# Patient Record
Sex: Female | Born: 1975 | Race: Black or African American | Hispanic: No | Marital: Married | Smoking: Never smoker
Health system: Southern US, Community
[De-identification: ages and names within clinical notes are randomized; demographics above are authoritative.]

## PROBLEM LIST (undated history)

## (undated) ENCOUNTER — Inpatient Hospital Stay (HOSPITAL_COMMUNITY): Payer: Self-pay

## (undated) ENCOUNTER — Inpatient Hospital Stay (HOSPITAL_COMMUNITY): Payer: BC Managed Care – PPO

## (undated) DIAGNOSIS — Z9289 Personal history of other medical treatment: Secondary | ICD-10-CM

## (undated) DIAGNOSIS — K3184 Gastroparesis: Secondary | ICD-10-CM

## (undated) DIAGNOSIS — F32A Depression, unspecified: Secondary | ICD-10-CM

## (undated) DIAGNOSIS — F329 Major depressive disorder, single episode, unspecified: Secondary | ICD-10-CM

## (undated) DIAGNOSIS — N915 Oligomenorrhea, unspecified: Secondary | ICD-10-CM

## (undated) DIAGNOSIS — E039 Hypothyroidism, unspecified: Secondary | ICD-10-CM

## (undated) DIAGNOSIS — K279 Peptic ulcer, site unspecified, unspecified as acute or chronic, without hemorrhage or perforation: Secondary | ICD-10-CM

## (undated) DIAGNOSIS — D649 Anemia, unspecified: Secondary | ICD-10-CM

## (undated) DIAGNOSIS — I959 Hypotension, unspecified: Secondary | ICD-10-CM

## (undated) DIAGNOSIS — B9681 Helicobacter pylori [H. pylori] as the cause of diseases classified elsewhere: Secondary | ICD-10-CM

## (undated) DIAGNOSIS — R519 Headache, unspecified: Secondary | ICD-10-CM

## (undated) DIAGNOSIS — Z8659 Personal history of other mental and behavioral disorders: Secondary | ICD-10-CM

## (undated) DIAGNOSIS — Z87448 Personal history of other diseases of urinary system: Secondary | ICD-10-CM

## (undated) DIAGNOSIS — Z8619 Personal history of other infectious and parasitic diseases: Secondary | ICD-10-CM

## (undated) DIAGNOSIS — B379 Candidiasis, unspecified: Secondary | ICD-10-CM

## (undated) DIAGNOSIS — N938 Other specified abnormal uterine and vaginal bleeding: Secondary | ICD-10-CM

## (undated) DIAGNOSIS — F419 Anxiety disorder, unspecified: Secondary | ICD-10-CM

## (undated) DIAGNOSIS — R102 Pelvic and perineal pain: Secondary | ICD-10-CM

## (undated) DIAGNOSIS — K219 Gastro-esophageal reflux disease without esophagitis: Secondary | ICD-10-CM

## (undated) DIAGNOSIS — Z8742 Personal history of other diseases of the female genital tract: Secondary | ICD-10-CM

## (undated) DIAGNOSIS — E669 Obesity, unspecified: Secondary | ICD-10-CM

## (undated) DIAGNOSIS — R51 Headache: Secondary | ICD-10-CM

## (undated) DIAGNOSIS — Z889 Allergy status to unspecified drugs, medicaments and biological substances status: Secondary | ICD-10-CM

## (undated) DIAGNOSIS — J45909 Unspecified asthma, uncomplicated: Secondary | ICD-10-CM

## (undated) DIAGNOSIS — K760 Fatty (change of) liver, not elsewhere classified: Secondary | ICD-10-CM

## (undated) HISTORY — PX: UPPER GI ENDOSCOPY: SHX6162

## (undated) HISTORY — DX: Fatty (change of) liver, not elsewhere classified: K76.0

## (undated) HISTORY — DX: Personal history of other infectious and parasitic diseases: Z86.19

## (undated) HISTORY — DX: Personal history of other diseases of the female genital tract: Z87.42

## (undated) HISTORY — DX: Helicobacter pylori (H. pylori) as the cause of diseases classified elsewhere: B96.81

## (undated) HISTORY — DX: Gastroparesis: K31.84

## (undated) HISTORY — DX: Personal history of other diseases of urinary system: Z87.448

## (undated) HISTORY — DX: Candidiasis, unspecified: B37.9

## (undated) HISTORY — DX: Major depressive disorder, single episode, unspecified: F32.9

## (undated) HISTORY — DX: Personal history of other mental and behavioral disorders: Z86.59

## (undated) HISTORY — DX: Pelvic and perineal pain: R10.2

## (undated) HISTORY — DX: Obesity, unspecified: E66.9

## (undated) HISTORY — DX: Other specified abnormal uterine and vaginal bleeding: N93.8

## (undated) HISTORY — DX: Peptic ulcer, site unspecified, unspecified as acute or chronic, without hemorrhage or perforation: K27.9

## (undated) HISTORY — DX: Oligomenorrhea, unspecified: N91.5

## (undated) HISTORY — DX: Depression, unspecified: F32.A

---

## 2002-07-17 ENCOUNTER — Ambulatory Visit (HOSPITAL_COMMUNITY): Admission: RE | Admit: 2002-07-17 | Discharge: 2002-07-17 | Payer: Self-pay

## 2002-10-08 ENCOUNTER — Other Ambulatory Visit: Admission: RE | Admit: 2002-10-08 | Discharge: 2002-10-08 | Payer: Self-pay | Admitting: Gynecology

## 2002-10-21 ENCOUNTER — Encounter: Admission: RE | Admit: 2002-10-21 | Discharge: 2003-01-19 | Payer: Self-pay | Admitting: Family Medicine

## 2003-01-17 ENCOUNTER — Encounter (HOSPITAL_COMMUNITY): Admission: RE | Admit: 2003-01-17 | Discharge: 2003-01-19 | Payer: Self-pay | Admitting: Gynecology

## 2003-04-18 ENCOUNTER — Inpatient Hospital Stay (HOSPITAL_COMMUNITY): Admission: AD | Admit: 2003-04-18 | Discharge: 2003-04-18 | Payer: Self-pay | Admitting: Gynecology

## 2005-01-05 ENCOUNTER — Other Ambulatory Visit: Admission: RE | Admit: 2005-01-05 | Discharge: 2005-01-05 | Payer: Self-pay | Admitting: Obstetrics and Gynecology

## 2005-04-27 ENCOUNTER — Other Ambulatory Visit: Admission: RE | Admit: 2005-04-27 | Discharge: 2005-04-27 | Payer: Self-pay | Admitting: Obstetrics and Gynecology

## 2005-09-16 ENCOUNTER — Inpatient Hospital Stay (HOSPITAL_COMMUNITY): Admission: AD | Admit: 2005-09-16 | Discharge: 2005-09-16 | Payer: Self-pay | Admitting: Gynecology

## 2005-09-17 ENCOUNTER — Encounter (HOSPITAL_COMMUNITY): Admission: RE | Admit: 2005-09-17 | Discharge: 2005-10-17 | Payer: Self-pay | Admitting: Gynecology

## 2005-12-22 ENCOUNTER — Emergency Department (HOSPITAL_COMMUNITY): Admission: EM | Admit: 2005-12-22 | Discharge: 2005-12-22 | Payer: Self-pay | Admitting: Emergency Medicine

## 2006-05-01 HISTORY — PX: DILATION AND CURETTAGE OF UTERUS: SHX78

## 2007-01-22 ENCOUNTER — Other Ambulatory Visit: Admission: RE | Admit: 2007-01-22 | Discharge: 2007-01-22 | Payer: Self-pay | Admitting: Gynecology

## 2008-01-30 ENCOUNTER — Other Ambulatory Visit: Admission: RE | Admit: 2008-01-30 | Discharge: 2008-01-30 | Payer: Self-pay | Admitting: Gynecology

## 2009-01-29 ENCOUNTER — Ambulatory Visit (HOSPITAL_COMMUNITY): Admission: RE | Admit: 2009-01-29 | Discharge: 2009-01-29 | Payer: Self-pay | Admitting: Obstetrics

## 2009-01-29 ENCOUNTER — Encounter (INDEPENDENT_AMBULATORY_CARE_PROVIDER_SITE_OTHER): Payer: Self-pay | Admitting: Obstetrics

## 2010-03-07 DIAGNOSIS — Z8742 Personal history of other diseases of the female genital tract: Secondary | ICD-10-CM

## 2010-03-07 HISTORY — DX: Personal history of other diseases of the female genital tract: Z87.42

## 2010-08-04 LAB — CBC
HCT: 33.7 % — ABNORMAL LOW (ref 36.0–46.0)
Hemoglobin: 11.1 g/dL — ABNORMAL LOW (ref 12.0–15.0)
MCHC: 32.8 g/dL (ref 30.0–36.0)
MCV: 80 fL (ref 78.0–100.0)
Platelets: 249 10*3/uL (ref 150–400)
RBC: 4.22 MIL/uL (ref 3.87–5.11)
RDW: 14.5 % (ref 11.5–15.5)
WBC: 9.4 10*3/uL (ref 4.0–10.5)

## 2010-08-04 LAB — TYPE AND SCREEN
ABO/RH(D): O POS
Antibody Screen: NEGATIVE

## 2010-08-04 LAB — ABO/RH: ABO/RH(D): O POS

## 2010-08-16 ENCOUNTER — Other Ambulatory Visit (HOSPITAL_COMMUNITY): Payer: Self-pay | Admitting: Gastroenterology

## 2010-08-16 DIAGNOSIS — R1013 Epigastric pain: Secondary | ICD-10-CM

## 2010-08-16 DIAGNOSIS — R112 Nausea with vomiting, unspecified: Secondary | ICD-10-CM

## 2010-08-30 ENCOUNTER — Encounter (HOSPITAL_COMMUNITY)
Admission: RE | Admit: 2010-08-30 | Discharge: 2010-08-30 | Disposition: A | Discharge status: Home / Self Care | Payer: Managed Care, Other (non HMO) | Source: Ambulatory Visit | Attending: Gastroenterology | Admitting: Gastroenterology

## 2010-08-30 ENCOUNTER — Ambulatory Visit (HOSPITAL_COMMUNITY)
Admission: RE | Admit: 2010-08-30 | Discharge: 2010-08-30 | Disposition: A | Discharge status: Home / Self Care | Payer: Managed Care, Other (non HMO) | Source: Ambulatory Visit | Attending: Gastroenterology | Admitting: Gastroenterology

## 2010-08-30 DIAGNOSIS — R1013 Epigastric pain: Secondary | ICD-10-CM

## 2010-08-30 DIAGNOSIS — R112 Nausea with vomiting, unspecified: Secondary | ICD-10-CM | POA: Insufficient documentation

## 2010-08-30 MED ORDER — TECHNETIUM TC 99M MEBROFENIN IV KIT
5.0000 | PACK | Freq: Once | INTRAVENOUS | Status: AC | PRN
  Administered 2010-08-30: 5 via INTRAVENOUS

## 2010-09-01 ENCOUNTER — Other Ambulatory Visit (HOSPITAL_COMMUNITY): Payer: Self-pay | Admitting: Gastroenterology

## 2010-09-01 DIAGNOSIS — R1013 Epigastric pain: Secondary | ICD-10-CM

## 2010-09-01 DIAGNOSIS — R112 Nausea with vomiting, unspecified: Secondary | ICD-10-CM

## 2010-09-08 ENCOUNTER — Encounter (HOSPITAL_COMMUNITY)
Admission: RE | Admit: 2010-09-08 | Discharge: 2010-09-08 | Disposition: A | Discharge status: Home / Self Care | Payer: Managed Care, Other (non HMO) | Source: Ambulatory Visit | Attending: Gastroenterology | Admitting: Gastroenterology

## 2010-09-08 DIAGNOSIS — R112 Nausea with vomiting, unspecified: Secondary | ICD-10-CM

## 2010-09-08 DIAGNOSIS — R1013 Epigastric pain: Secondary | ICD-10-CM

## 2010-09-08 MED ORDER — TECHNETIUM TC 99M SULFUR COLLOID
2.0000 | Freq: Once | INTRAVENOUS | Status: AC | PRN
  Administered 2010-09-08: 2 via INTRAVENOUS

## 2010-09-12 DIAGNOSIS — N915 Oligomenorrhea, unspecified: Secondary | ICD-10-CM

## 2010-09-12 HISTORY — DX: Oligomenorrhea, unspecified: N91.5

## 2010-09-16 ENCOUNTER — Other Ambulatory Visit (HOSPITAL_COMMUNITY): Payer: Managed Care, Other (non HMO)

## 2011-03-14 DIAGNOSIS — Z87448 Personal history of other diseases of urinary system: Secondary | ICD-10-CM

## 2011-03-14 DIAGNOSIS — R102 Pelvic and perineal pain: Secondary | ICD-10-CM

## 2011-03-14 HISTORY — DX: Personal history of other diseases of urinary system: Z87.448

## 2011-03-14 HISTORY — DX: Pelvic and perineal pain: R10.2

## 2011-07-10 ENCOUNTER — Encounter (INDEPENDENT_AMBULATORY_CARE_PROVIDER_SITE_OTHER): Payer: Managed Care, Other (non HMO) | Admitting: Obstetrics and Gynecology

## 2011-07-10 DIAGNOSIS — O24919 Unspecified diabetes mellitus in pregnancy, unspecified trimester: Secondary | ICD-10-CM

## 2011-07-10 DIAGNOSIS — Z8742 Personal history of other diseases of the female genital tract: Secondary | ICD-10-CM | POA: Insufficient documentation

## 2011-07-10 DIAGNOSIS — O368390 Maternal care for abnormalities of the fetal heart rate or rhythm, unspecified trimester, not applicable or unspecified: Secondary | ICD-10-CM

## 2011-07-10 DIAGNOSIS — R638 Other symptoms and signs concerning food and fluid intake: Secondary | ICD-10-CM | POA: Insufficient documentation

## 2011-07-10 DIAGNOSIS — E119 Type 2 diabetes mellitus without complications: Secondary | ICD-10-CM | POA: Insufficient documentation

## 2011-07-10 LAB — OB RESULTS CONSOLE HEPATITIS B SURFACE ANTIGEN: Hepatitis B Surface Ag: NEGATIVE

## 2011-07-10 LAB — OB RESULTS CONSOLE PLATELET COUNT: Platelets: 277 10*3/uL

## 2011-07-10 LAB — OB RESULTS CONSOLE ABO/RH: RH Type: POSITIVE

## 2011-07-10 LAB — OB RESULTS CONSOLE HIV ANTIBODY (ROUTINE TESTING): HIV: NONREACTIVE

## 2011-07-14 ENCOUNTER — Encounter: Payer: Managed Care, Other (non HMO) | Attending: Obstetrics and Gynecology | Admitting: Dietician

## 2011-07-14 ENCOUNTER — Encounter: Payer: Self-pay | Admitting: Dietician

## 2011-07-14 DIAGNOSIS — E119 Type 2 diabetes mellitus without complications: Secondary | ICD-10-CM | POA: Insufficient documentation

## 2011-07-14 DIAGNOSIS — O24919 Unspecified diabetes mellitus in pregnancy, unspecified trimester: Secondary | ICD-10-CM | POA: Insufficient documentation

## 2011-07-14 DIAGNOSIS — Z713 Dietary counseling and surveillance: Secondary | ICD-10-CM | POA: Insufficient documentation

## 2011-07-14 NOTE — Progress Notes (Signed)
Assessment;Kathryn Howard, is seen today for type 2 diabetes and pregnancy.  We reviewed the physiological changes of pregnancy and how they would effect her blood glucose levels as the pregnancy progresses.  I reviewed monitoring and the blood glucose goals for fasting and at 2 hours after the first bite of each meal.  I used my gestational handout for a review of the diet and the monitoring along with the suggested meal plan.  She has met with the RD at [facility] and has a diet plan that she has started to follow.   Insulin Instruction:  Dr. Pennie Rushing ask that she receive insulin instruction.  She was instructed on mixing insulin and provided a return demonstration of mixing NPH and Regular insulin using sterile saline solution.  She is aware that insulin Kathryn Howard be needed to assist with glucose control later in the pregnancy and she will not start the insulin until it is prescribed.  Patient was seen on 07/14/2011 for Type 2 Diabetes and Pregnancy in a 1:1 appointment along with insulin instruction at the [facility]. The following learning objectives were met by the patient during this session:   Relates the implications of Type 2 diabetes on pregnancy.  States why dietary management is important in controlling blood glucose  Describes the effects each nutrient has on blood glucose levels  Demonstrates ability to create a balanced meal plan  Demonstrates carbohydrate counting   States when to check blood glucose levels  Demonstrates proper blood glucose monitoring techniques  States the effect of stress and exercise on blood glucose levels  States the importance of limiting caffeine and abstaining from alcohol and smoking  Blood glucose monitor given: Has her own One Touch Ultra 2 meter and is monitoring fasting and 2hour post meal blood glucose levels. Blood glucose readings (per her records): February 25 to march 15   Fasting: Feb: 96,89,98,95,  March:89,98,94,90,89,93,87,89,81,85,87,88,80,87 (3/35) 2 hr post BK:157,109,124,127,126 2 hr post LU: 25,366,440,34,742,595,63,875 2 hr post Din: 153,189,115,87,138,116,128,155,88,166   Patient instructed to monitor glucose levels: FBS: 60 - <90 2 hour: <120  Patient received handouts:  Nutrition Diabetes and Pregnancy  Carbohydrate Counting List  Patient will be seen for follow-up as needed.

## 2011-08-02 ENCOUNTER — Encounter (INDEPENDENT_AMBULATORY_CARE_PROVIDER_SITE_OTHER): Payer: Managed Care, Other (non HMO)

## 2011-08-02 ENCOUNTER — Encounter (INDEPENDENT_AMBULATORY_CARE_PROVIDER_SITE_OTHER): Payer: Managed Care, Other (non HMO) | Admitting: Obstetrics and Gynecology

## 2011-08-02 DIAGNOSIS — Z36 Encounter for antenatal screening of mother: Secondary | ICD-10-CM

## 2011-08-02 DIAGNOSIS — O24919 Unspecified diabetes mellitus in pregnancy, unspecified trimester: Secondary | ICD-10-CM

## 2011-08-28 ENCOUNTER — Encounter: Payer: Self-pay | Admitting: Obstetrics and Gynecology

## 2011-08-30 ENCOUNTER — Encounter: Payer: Self-pay | Admitting: Obstetrics and Gynecology

## 2011-08-30 DIAGNOSIS — Z412 Encounter for routine and ritual male circumcision: Secondary | ICD-10-CM

## 2011-08-30 DIAGNOSIS — E119 Type 2 diabetes mellitus without complications: Secondary | ICD-10-CM

## 2011-08-30 DIAGNOSIS — R638 Other symptoms and signs concerning food and fluid intake: Secondary | ICD-10-CM

## 2011-08-30 DIAGNOSIS — Z8742 Personal history of other diseases of the female genital tract: Secondary | ICD-10-CM

## 2011-08-30 DIAGNOSIS — N90810 Female genital mutilation status, unspecified: Secondary | ICD-10-CM | POA: Insufficient documentation

## 2011-08-31 ENCOUNTER — Encounter: Payer: Self-pay | Admitting: Obstetrics and Gynecology

## 2011-08-31 ENCOUNTER — Ambulatory Visit (INDEPENDENT_AMBULATORY_CARE_PROVIDER_SITE_OTHER): Payer: Managed Care, Other (non HMO) | Admitting: Obstetrics and Gynecology

## 2011-08-31 VITALS — BP 102/62 | Wt 235.0 lb

## 2011-08-31 DIAGNOSIS — O2692 Pregnancy related conditions, unspecified, second trimester: Secondary | ICD-10-CM

## 2011-08-31 DIAGNOSIS — R350 Frequency of micturition: Secondary | ICD-10-CM

## 2011-08-31 DIAGNOSIS — O2690 Pregnancy related conditions, unspecified, unspecified trimester: Secondary | ICD-10-CM

## 2011-08-31 DIAGNOSIS — O24919 Unspecified diabetes mellitus in pregnancy, unspecified trimester: Secondary | ICD-10-CM

## 2011-08-31 LAB — POCT URINALYSIS DIPSTICK
Blood, UA: 250
Glucose, UA: NEGATIVE
Spec Grav, UA: 1.025
Urobilinogen, UA: NEGATIVE

## 2011-08-31 NOTE — Progress Notes (Signed)
1) Pt c/o lower abdominal pressure when sitting for long periods of time. Pt c/o urinary frequency but usually only a small amount of urine and discomfort.  POCT UA neg  2)Type III DM  Dr. Lucianne Muss started pt n insulin in addition to glyburide and metformin.  Will discuss with him to see if insulin as a single drug canbe considered for management.     FBS=82-98  2HR PC DINNER=55-162 so Dr. Lucianne Muss added 3 units novolog ac dinner Pt will begin doing 2 hr pc breakfast and lunch as well   3)Anatomy US in 2 wks  4) needs fetal cardiac ECHO at 20-22 weeks  5) AFP done today

## 2011-09-04 LAB — ALPHA FETOPROTEIN, MATERNAL
AFP: 21.2 IU/mL
Curr Gest Age: 16.3 wks.days
MoM for AFP: 1
Open Spina bifida: NEGATIVE
Osb Risk: 1:13400 {titer}

## 2011-09-14 ENCOUNTER — Inpatient Hospital Stay (HOSPITAL_COMMUNITY): Payer: Managed Care, Other (non HMO)

## 2011-09-14 ENCOUNTER — Inpatient Hospital Stay (HOSPITAL_COMMUNITY)
Admission: AD | Admit: 2011-09-14 | Discharge: 2011-10-22 | DRG: 765 | Disposition: A | Discharge status: Home / Self Care | Payer: Managed Care, Other (non HMO) | Source: Ambulatory Visit | Attending: Obstetrics and Gynecology | Admitting: Obstetrics and Gynecology

## 2011-09-14 ENCOUNTER — Encounter (HOSPITAL_COMMUNITY): Payer: Self-pay | Admitting: *Deleted

## 2011-09-14 DIAGNOSIS — O3430 Maternal care for cervical incompetence, unspecified trimester: Principal | ICD-10-CM

## 2011-09-14 DIAGNOSIS — E119 Type 2 diabetes mellitus without complications: Secondary | ICD-10-CM | POA: Diagnosis present

## 2011-09-14 DIAGNOSIS — O99892 Other specified diseases and conditions complicating childbirth: Secondary | ICD-10-CM | POA: Diagnosis present

## 2011-09-14 DIAGNOSIS — Z8742 Personal history of other diseases of the female genital tract: Secondary | ICD-10-CM | POA: Diagnosis present

## 2011-09-14 DIAGNOSIS — O328XX0 Maternal care for other malpresentation of fetus, not applicable or unspecified: Secondary | ICD-10-CM | POA: Diagnosis present

## 2011-09-14 DIAGNOSIS — R000 Tachycardia, unspecified: Secondary | ICD-10-CM | POA: Diagnosis not present

## 2011-09-14 DIAGNOSIS — O24919 Unspecified diabetes mellitus in pregnancy, unspecified trimester: Secondary | ICD-10-CM

## 2011-09-14 DIAGNOSIS — E282 Polycystic ovarian syndrome: Secondary | ICD-10-CM | POA: Diagnosis present

## 2011-09-14 DIAGNOSIS — O9903 Anemia complicating the puerperium: Secondary | ICD-10-CM | POA: Diagnosis present

## 2011-09-14 DIAGNOSIS — W07XXXA Fall from chair, initial encounter: Secondary | ICD-10-CM | POA: Diagnosis not present

## 2011-09-14 DIAGNOSIS — Y9210 Unspecified residential institution as the place of occurrence of the external cause: Secondary | ICD-10-CM | POA: Diagnosis not present

## 2011-09-14 DIAGNOSIS — O8612 Endometritis following delivery: Secondary | ICD-10-CM | POA: Diagnosis present

## 2011-09-14 DIAGNOSIS — N90810 Female genital mutilation status, unspecified: Secondary | ICD-10-CM | POA: Diagnosis present

## 2011-09-14 DIAGNOSIS — R638 Other symptoms and signs concerning food and fluid intake: Secondary | ICD-10-CM | POA: Diagnosis present

## 2011-09-14 DIAGNOSIS — O34599 Maternal care for other abnormalities of gravid uterus, unspecified trimester: Secondary | ICD-10-CM | POA: Diagnosis present

## 2011-09-14 DIAGNOSIS — O99814 Abnormal glucose complicating childbirth: Secondary | ICD-10-CM | POA: Diagnosis present

## 2011-09-14 DIAGNOSIS — D649 Anemia, unspecified: Secondary | ICD-10-CM | POA: Diagnosis present

## 2011-09-14 LAB — WET PREP, GENITAL

## 2011-09-14 LAB — CBC
MCH: 26.3 pg (ref 26.0–34.0)
MCV: 80.1 fL (ref 78.0–100.0)
Platelets: 229 10*3/uL (ref 150–400)
RBC: 4.18 MIL/uL (ref 3.87–5.11)
RDW: 14.2 % (ref 11.5–15.5)
WBC: 12.2 10*3/uL — ABNORMAL HIGH (ref 4.0–10.5)

## 2011-09-14 LAB — URINALYSIS, ROUTINE W REFLEX MICROSCOPIC
Bilirubin Urine: NEGATIVE
Ketones, ur: NEGATIVE mg/dL
Leukocytes, UA: NEGATIVE
Nitrite: NEGATIVE
Protein, ur: NEGATIVE mg/dL
Urobilinogen, UA: 0.2 mg/dL (ref 0.0–1.0)
pH: 6.5 (ref 5.0–8.0)

## 2011-09-14 LAB — URINE MICROSCOPIC-ADD ON

## 2011-09-14 LAB — DIFFERENTIAL
Basophils Absolute: 0 10*3/uL (ref 0.0–0.1)
Eosinophils Absolute: 0 10*3/uL (ref 0.0–0.7)
Eosinophils Relative: 0 % (ref 0–5)
Lymphocytes Relative: 15 % (ref 12–46)
Lymphs Abs: 1.8 10*3/uL (ref 0.7–4.0)
Neutrophils Relative %: 81 % — ABNORMAL HIGH (ref 43–77)

## 2011-09-14 LAB — GLUCOSE, RANDOM: Glucose, Bld: 89 mg/dL (ref 70–99)

## 2011-09-14 LAB — GLUCOSE, CAPILLARY

## 2011-09-14 MED ORDER — GLYBURIDE 2.5 MG PO TABS
2.5000 mg | ORAL_TABLET | Freq: Every day | ORAL | Status: DC
  Filled 2011-09-14: qty 1

## 2011-09-14 MED ORDER — DOCUSATE SODIUM 100 MG PO CAPS
100.0000 mg | ORAL_CAPSULE | Freq: Every day | ORAL | Status: DC
  Administered 2011-09-14 – 2011-09-17 (×4): 100 mg via ORAL
  Filled 2011-09-14 (×4): qty 1

## 2011-09-14 MED ORDER — PENICILLIN G POTASSIUM 5000000 UNITS IJ SOLR
2.5000 10*6.[IU] | INTRAVENOUS | Status: DC
  Administered 2011-09-14 – 2011-09-16 (×12): 2.5 10*6.[IU] via INTRAVENOUS
  Filled 2011-09-14 (×15): qty 2.5

## 2011-09-14 MED ORDER — CALCIUM CARBONATE ANTACID 500 MG PO CHEW
2.0000 | CHEWABLE_TABLET | ORAL | Status: DC | PRN
  Administered 2011-09-18 – 2011-10-17 (×7): 400 mg via ORAL
  Filled 2011-09-14 (×4): qty 2
  Filled 2011-09-14: qty 1
  Filled 2011-09-14 (×2): qty 2
  Filled 2011-09-14: qty 1

## 2011-09-14 MED ORDER — ACETAMINOPHEN 325 MG PO TABS: 650.0000 mg | ORAL_TABLET | ORAL | Status: DC | PRN

## 2011-09-14 MED ORDER — SIMETHICONE 80 MG PO CHEW: 80.0000 mg | CHEWABLE_TABLET | Freq: Four times a day (QID) | ORAL | Status: DC | PRN

## 2011-09-14 MED ORDER — FERROUS SULFATE 325 (65 FE) MG PO TABS
325.0000 mg | ORAL_TABLET | Freq: Every day | ORAL | Status: DC
  Administered 2011-09-14 – 2011-10-17 (×34): 325 mg via ORAL
  Filled 2011-09-14 (×36): qty 1

## 2011-09-14 MED ORDER — IBUPROFEN 600 MG PO TABS: 600.0000 mg | ORAL_TABLET | Freq: Four times a day (QID) | ORAL | Status: DC | PRN

## 2011-09-14 MED ORDER — PENICILLIN G POTASSIUM 5000000 UNITS IJ SOLR
5.0000 10*6.[IU] | Freq: Once | INTRAVENOUS | Status: AC
  Administered 2011-09-14: 5 10*6.[IU] via INTRAVENOUS
  Filled 2011-09-14: qty 5

## 2011-09-14 MED ORDER — LACTATED RINGERS IV SOLN
INTRAVENOUS | Status: DC
  Administered 2011-09-14 – 2011-09-15 (×5): via INTRAVENOUS

## 2011-09-14 MED ORDER — FERROUS SULFATE 325 (65 FE) MG PO TABS: 325.0000 mg | ORAL_TABLET | Freq: Every day | ORAL | Status: DC

## 2011-09-14 MED ORDER — ZOLPIDEM TARTRATE 10 MG PO TABS: 10.0000 mg | ORAL_TABLET | Freq: Every evening | ORAL | Status: DC | PRN

## 2011-09-14 MED ORDER — IRON 66 MG PO TABS: 1.0000 | ORAL_TABLET | Freq: Every day | ORAL | Status: DC

## 2011-09-14 MED ORDER — INOSITOL-FOLIC ACID 2000-200 MG-MCG PO PACK: 2.0000 | PACK | Freq: Two times a day (BID) | ORAL | Status: DC

## 2011-09-14 MED ORDER — PRENATAL MULTIVITAMIN CH
1.0000 | ORAL_TABLET | Freq: Every day | ORAL | Status: DC
  Administered 2011-09-14 – 2011-10-17 (×34): 1 via ORAL
  Filled 2011-09-14 (×34): qty 1

## 2011-09-14 MED ORDER — VITAMIN D3 125 MCG (5000 UT) PO CAPS: 1.0000 | ORAL_CAPSULE | Freq: Every day | ORAL | Status: DC

## 2011-09-14 MED ORDER — VITAMIN D3 25 MCG (1000 UNIT) PO TABS
5000.0000 [IU] | ORAL_TABLET | Freq: Every day | ORAL | Status: DC
  Administered 2011-09-14 – 2011-10-17 (×34): 5000 [IU] via ORAL
  Filled 2011-09-14 (×12): qty 5
  Filled 2011-09-14: qty 1
  Filled 2011-09-14 (×24): qty 5

## 2011-09-14 MED ORDER — METFORMIN HCL ER 500 MG PO TB24
2000.0000 mg | ORAL_TABLET | Freq: Every day | ORAL | Status: DC
  Administered 2011-09-14 – 2011-10-17 (×34): 2000 mg via ORAL
  Filled 2011-09-14 (×34): qty 4

## 2011-09-14 NOTE — MAU Note (Signed)
VOIDED  ON BEDPAN. 

## 2011-09-14 NOTE — MAU Note (Signed)
PT IS NEWLY DX DIABETIC- X2 WEEKS.   PM  BLOOD SUGAR WAS 130.  PT HAS BOOK WITH RECORDED BLOOD SUGARS

## 2011-09-14 NOTE — H&P (Signed)
Kathryn Howard is a 36 y.o. married arabic female presenting at 18.2 weeks for evaluation of bleeding that she noted just before 0030 when having a bowel movement.  Denies any h/o bleeding during this pregnancy.  Nothing per vagina in last 48 hrs.  No abdominal pain currently.  Does report "straining" w/ BM and has been constipated.  Reports more pelvic "pain and pressure" over the last month, especially w/ sitting long periods of time (notes more when at work).  Reports frequency during pregnancy, but unchanged over course of gestation, and no other UTI s/s.  Denies recent illness or fever.   Prenatal Course: Pt started care around 9 weeks and had 2 early viability u/s at both 6 & 9 weeks.  Also had 1st trimester screen w/ u/s, but no cervical length documented.  Aneuploidy screens were WNL.  She has not yet had anatomy scan. Pt has been followed by Dr. Lucianne Muss r/e Type II DM, and had been on Glyburide and Metformin for "4-5 months," but was recently started on insulin at night for elevated cbg's after supper in the last 2 weeks.  She did take progesterone suppositories until 14 weeks secondary to h/o infertility, previous SAB, & PCOS.    OB Hx: G1=SAB with D&E in 2008 G2=current  Maternal Medical History:  Prenatal complications: 1.  Type II DM 2. Obese 3.  PCOS 4.  H/o anxiety & depression--no current meds 5.  H/o infertility 6.  Female circ  Prenatal Complications - Diabetes: type 2. Diabetes is managed by oral agent (dual therapy) and insulin injections.   On insulin, metformin, & glyburide  OB History    Grav Para Term Preterm Abortions TAB SAB Ect Mult Living   2    1  1         Past Medical History  Diagnosis Date  . Vitamin d deficiency   . Obesity   . Gastroparesis   . Diabetes mellitus   . Acid reflux   . Fatty liver   . DUB (dysfunctional uterine bleeding)   . H pylori ulcer   . H/O rubella   . H/O varicella   . Depression     stopped meds 2012  . History of anxiety   .  History of PCOS 03/07/10  . Oligomenorrhea 09/12/10  . Pelvic pain 03/14/11    right sided back  . H/O hematuria 03/14/11  . History of ovarian cyst   . Yeast infection    Past Surgical History  Procedure Date  . Dilation and curettage of uterus 2008   Family History: family history includes Diabetes in her father and mother; Hyperlipidemia in her mother; and Hypertension in her father and mother. Social History:  reports that she has never smoked. She has never used smokeless tobacco. She reports that she does not drink alcohol or use illicit drugs.  Review of Systems  Constitutional: Negative.   HENT: Negative.   Eyes: Negative.   Respiratory: Negative.   Cardiovascular: Negative.   Gastrointestinal: Positive for constipation.  Genitourinary: Negative.   Skin: Negative.   Neurological: Negative.     Dilation: 3.5 Exam by:: HILIARY, CNM   Blood pressure 123/72, pulse 96, temperature 98.1 F (36.7 C), temperature source Oral, resp. rate 20, height 5\' 4"  (1.626 m), weight 238 lb 2 oz (108.013 kg), unknown if currently breastfeeding. Maternal Exam:  Abdomen: Patient reports no abdominal tenderness. Cervix: Cervix evaluated by sterile speculum exam and digital exam.     Fetal Exam Fetal Monitor  Review: Mode: hand-held doppler probe.   Baseline rate: 152.      Physical Exam  Constitutional: She is oriented to person, place, and time. She appears well-developed and well-nourished. No distress.  HENT:  Head: Normocephalic and atraumatic.  Cardiovascular: Normal rate.   Respiratory: Effort normal.  GI: Soft.       gravid  Genitourinary:       SSE:  As spec opened, membranes noted bulging in vault; not close to introitus.  Cx 3-4 cm on bimanual, but difficulty assessing b/c high in vault and cx retracted behind membranes.  BBOW. Small amt of mucousy BRB at introitus, and small amt in vault around membranes  Neurological: She is alert and oriented to person, place, and time.   Skin: Skin is warm and dry.   U/s:  SIUP with AFI subjectively WNL; limited anatomy secondary to habitus and GA, but what seen, WNL.  cx dilated to 1.5 cm, with hourglass of membranes through os.  EFW=9oz (50%).    Prenatal labs: ABO, Rh: O/Positive/-- (03/11 0000) Antibody:  negative Rubella: Immune (03/11 0000) RPR: Nonreactive (03/11 0000)  HBsAg: Negative (03/11 0000)  HIV: Non-reactive (03/11 0000)  GBS:   sent 5/16 1st trimester screen and AFP negative  Assessment/Plan: 1. 18.2 weeks 2.  hourglassing membranes  3.  Type II DM--triple agent therapy  1.  Admit to antenatal w/ Dr. Normand Sloop as attending 2.  Trendelenberg, Foley to SD, PCN IV per protocol, cbg's 4x daily; urine and vaginal cx's sent.  CBC this AM w/ serum glucose.   3.  Per c/w Dr. Normand Sloop, MFM consult this AM for cervical length and discuss if pt is candidate for rescue cerclage 4.  MD to follow    Arrin Pintor H 09/14/2011, 2:08 AM

## 2011-09-14 NOTE — Progress Notes (Signed)
Kathryn Howard is a 36 y.o. G2P0010 at [redacted]w[redacted]d by LMP admitted for preterm cervical dilation  Subjective: GI: negative GU: Denies: dysuria, frequency/urgency, hematuria, genital discharge, bleeding OB: No fetal movement yet        Objective: BP 105/46  Pulse 113  Temp(Src) 98.1 F (36.7 C) (Oral)  Resp 18  Ht 5\' 4"  (1.626 m)  Wt 238 lb 2 oz (108.013 kg)  BMI 40.87 kg/m2  SpO2 100% I/O last 3 completed shifts: In: -  Out: 2850 [Urine:2850] Total I/O In: -  Out: 1900 [Urine:1900]  FHT:  Stable in the 150s UC:   none SVE:   Dilation: 3.5 Exam by:: HILIARY, CNM    Labs: Lab Results  Component Value Date   WBC 12.2* 09/14/2011   HGB 11.0* 09/14/2011   HCT 33.5* 09/14/2011   MCV 80.1 09/14/2011   PLT 229 09/14/2011    Assessment / Plan: Preterm cervical changes Type II diabetes on multiple agents for control  Plan: 1) Long discussion with Dr. Earnie Larsson of MFM concerning his recommendations.  Long discussion with the patient reiterating the options for management of preterm cervical dilation at this early gestation.  His recommendation for amniocentesis is not acceptable to the patient, because of the risk of pregnancy loss.  She does, however wish to proceed with cerclage in spite of the risk of ROM, as she feels her chance of a good outcome is very low without this intervention 2)  Discussed patient's diabetes management with Dr. Lucianne Muss who will see her in am. He agrees that we can discontinue the glyburide and try to maximize sugar control with metformin and insulin   Kathryn Howard P 09/14/2011, 11:58 PM

## 2011-09-14 NOTE — MAU Note (Signed)
WENT TO B-ROOM- SAW  FEW DROPS ON PAPER.

## 2011-09-14 NOTE — Consult Note (Signed)
MATERNAL FETAL MEDICINE CONSULT  Patient Name: Kathryn Howard Medical Record Number:  [id number] Date of Birth: 25-Jun-1975 Requesting Physician Name:  Michael Litter, MD Date of Service: 09/14/2011  Chief Complaint Cervical insufficiency  History of Present Illness Kathryn Howard was seen today secondary to cervical insufficiency at the request of Michael Litter, MD.  The patient is a 36 y.o. G2P0010,at [redacted]w[redacted]d with an EDD of 02/13/2012, by Ultrasound dating method.  She reports progressively worsening symptoms of pelvic pressure and fullness over the past several days.  Last evening she noted vaginal spotting which prompted an evaluation today.  Ultrasound showed dilation of the cervix and membranes protruding up to an possibly past the external cervical os.  She notes no fevers, chills, abdominal pain, further vaginal bleeding, or loss of fluid.  She has not yet felt fetal movement this pregnancy.    Review of Systems Pertinent items are noted in HPI.  Patient History OB History    Grav Para Term Preterm Abortions TAB SAB Ect Mult Living   2    1  1         # Outc Date GA Lbr Len/2nd Wgt Sex Del Anes PTL Lv   1 SAB 2008 [redacted]w[redacted]d   U   No No   Comments: D&C   2 CUR               Past Medical History  Diagnosis Date  . Vitamin d deficiency   . Obesity   . Gastroparesis   . Diabetes mellitus   . Acid reflux   . Fatty liver   . DUB (dysfunctional uterine bleeding)   . H pylori ulcer   . H/O rubella   . H/O varicella   . Depression     stopped meds 2012  . History of anxiety   . History of PCOS 03/07/10  . Oligomenorrhea 09/12/10  . Pelvic pain 03/14/11    right sided back  . H/O hematuria 03/14/11  . History of ovarian cyst   . Yeast infection     Past Surgical History  Procedure Date  . Dilation and curettage of uterus 2008    History   Social History  . Marital Status: Married    Spouse Name: N/A    Number of Children: N/A  . Years of Education: N/A   Social History  Main Topics  . Smoking status: Never Smoker   . Smokeless tobacco: Never Used  . Alcohol Use: No  . Drug Use: No  . Sexually Active: Yes   Other Topics Concern  . None   Social History Narrative  . None    Family History  Problem Relation Age of Onset  . Diabetes Mother   . Hypertension Mother   . Hyperlipidemia Mother   . Diabetes Father   . Hypertension Father    In addition, the patient has no family history of mental retardation, birth defects, or genetic diseases.  There is a significant family history of recurrent pregnancy loss and cervical insufficiency in her family.  The patient's mother and several of her cousins have histories of second trimester losses and have required cerclage placement.  Physical Examination Filed Vitals:   09/14/11 1203  BP: 111/67  Pulse: 95  Temp: 98.4 F (36.9 C)  Resp:    General appearance - alert, well appearing, and in no distress Abdomen - soft, nontender, nondistended, no masses or organomegaly  Assessment and Recommendations 1.  Cervical insufficiency.  The patient's ultrasound  clearly demonstrates dilation of the cervix and without symptoms or other evidence of contractions she appears to have cervical insufficiency.  If nothing is done the patient will almost certainly experience rupture of membranes, develop chorioamnionitis, or go into labor prior to viability.  Thus, the only realistic option that may improve the prognosis for the pregnancy is a rescue cerclage.  There is a elevated risk of subclinical intrauterine infection associated with cervical insufficiency at this gestational age.  To rule this out an amniocentesis may be considered, but there is insufficient evidence to determine if this required in this circumstance. 2.  Type II diabetes mellitus.  Prior to pregnancy the patient's diabetes was managed with metformin.  During pregnancy she also required the addition of glyburide, and was recently started on insulin due to  continued poor glycemic control.  In general oral hypoglycemics are ineffective in achieving adequate glycemic control during pregnancy in women with pre-gestational diabetes.  Insulin is far superior in that regard.  I recommend discontinuing metformin and glyburide and escalating her dose of insulin in order to achieve fasting blood sugars of less than 95 and 2 hour post-prandial of less than 120.   Rema Fendt, MD

## 2011-09-14 NOTE — Progress Notes (Signed)
UR Chart review completed.  

## 2011-09-14 NOTE — Progress Notes (Addendum)
Patient ID: Kathryn Howard, female   DOB: 12/03/1975, 36 y.o.   MRN: [id number]  Hospital day # 0 pregnancy at [redacted]w[redacted]d   S: well, c/o gas pain and mild backache      Contractions:none      Vaginal bleeding:none now       Vaginal discharge: no significant change  O: BP 95/39  Pulse 81  Temp(Src) 98.1 F (36.7 C) (Oral)  Resp 18  Ht 5\' 4"  (1.626 m)  Wt 238 lb 2 oz (108.013 kg)  BMI 40.87 kg/m2  Breastfeeding? Unknown FBS this am =89 Physical exam: General: A&O no distress Lungs: CTAB Heart: RRR Uterus gravid, consistent with 18 weeks and non-tender BSx4 Foley in place Extremities: no significant edema and no signs of DVT, SCD on  A: [redacted]w[redacted]d with hourglassing membranes Type 2 IDDM Mild leukocytosis with slight left shift - on PCN GBS and UA cx pending MFM consult pending Will consult pt's endocrinologist for diabetes mgmnt  P: continue current plan of care MD to follow Will order simethicone PRN Malissa Hippo  MD 09/14/2011 9:04 AM

## 2011-09-14 NOTE — MAU Note (Signed)
PT SAYS  AT 0030 WENT TO B-ROOM TO HAVE BM-  CONSTIPATED.  WHEN WIPED  SAW  RED BLOOD ON PAPER.   IN RM 7- NO BLOOD ON PERINEUM.

## 2011-09-15 ENCOUNTER — Encounter (HOSPITAL_COMMUNITY): Payer: Self-pay | Admitting: Anesthesiology

## 2011-09-15 ENCOUNTER — Encounter (HOSPITAL_COMMUNITY)
Admission: AD | Disposition: A | Discharge status: Home / Self Care | Payer: Self-pay | Source: Ambulatory Visit | Attending: Obstetrics and Gynecology

## 2011-09-15 ENCOUNTER — Inpatient Hospital Stay (HOSPITAL_COMMUNITY): Payer: Managed Care, Other (non HMO) | Admitting: Anesthesiology

## 2011-09-15 HISTORY — PX: CERVICAL CERCLAGE: SHX1329

## 2011-09-15 LAB — URINE CULTURE
Colony Count: NO GROWTH
Culture  Setup Time: 201305161231
Culture: NO GROWTH

## 2011-09-15 LAB — GLUCOSE, CAPILLARY: Glucose-Capillary: 81 mg/dL (ref 70–99)

## 2011-09-15 SURGERY — CERCLAGE, CERVIX, VAGINAL APPROACH
Anesthesia: Spinal | Wound class: Clean Contaminated

## 2011-09-15 MED ORDER — NITROFURANTOIN MONOHYD MACRO 100 MG PO CAPS
100.0000 mg | ORAL_CAPSULE | Freq: Every day | ORAL | Status: DC
  Administered 2011-09-15 – 2011-10-01 (×17): 100 mg via ORAL
  Filled 2011-09-15 (×18): qty 1

## 2011-09-15 MED ORDER — LIDOCAINE IN DEXTROSE 5-7.5 % IV SOLN
INTRAVENOUS | Status: DC | PRN
  Administered 2011-09-15: 1 mL via INTRATHECAL

## 2011-09-15 MED ORDER — PROMETHAZINE HCL 25 MG PO TABS
12.5000 mg | ORAL_TABLET | ORAL | Status: DC | PRN
Start: 2011-09-15 — End: 2011-10-18

## 2011-09-15 MED ORDER — FENTANYL CITRATE 0.05 MG/ML IJ SOLN: 25.0000 ug | INTRAMUSCULAR | Status: DC | PRN

## 2011-09-15 MED ORDER — LACTATED RINGERS IV SOLN
INTRAVENOUS | Status: DC | PRN
  Administered 2011-09-15 (×2): via INTRAVENOUS

## 2011-09-15 MED ORDER — FENTANYL CITRATE 0.05 MG/ML IJ SOLN
INTRAMUSCULAR | Status: AC
Start: 2011-09-15 — End: 2011-09-15
  Filled 2011-09-15: qty 2

## 2011-09-15 MED ORDER — CEFAZOLIN SODIUM-DEXTROSE 2-3 GM-% IV SOLR: 2.0000 g | INTRAVENOUS | Status: DC

## 2011-09-15 MED ORDER — PROMETHAZINE HCL 25 MG/ML IJ SOLN: 12.5000 mg | INTRAMUSCULAR | Status: DC | PRN

## 2011-09-15 MED ORDER — PHENYLEPHRINE 40 MCG/ML (10ML) SYRINGE FOR IV PUSH (FOR BLOOD PRESSURE SUPPORT)
PREFILLED_SYRINGE | INTRAVENOUS | Status: AC
  Filled 2011-09-15: qty 5

## 2011-09-15 MED ORDER — CEFAZOLIN SODIUM-DEXTROSE 2-3 GM-% IV SOLR
2.0000 g | Freq: Once | INTRAVENOUS | Status: AC
  Administered 2011-09-15: 2 g via INTRAVENOUS
  Filled 2011-09-15: qty 50

## 2011-09-15 MED ORDER — FENTANYL CITRATE 0.05 MG/ML IJ SOLN
INTRAMUSCULAR | Status: AC
  Filled 2011-09-15: qty 2

## 2011-09-15 MED ORDER — OXYCODONE-ACETAMINOPHEN 5-325 MG PO TABS
1.0000 | ORAL_TABLET | ORAL | Status: DC | PRN
  Administered 2011-09-15: 1 via ORAL
  Filled 2011-09-15: qty 1

## 2011-09-15 MED ORDER — LIDOCAINE IN DEXTROSE 5-7.5 % IV SOLN
INTRAVENOUS | Status: AC
  Filled 2011-09-15: qty 2

## 2011-09-15 MED ORDER — PHENYLEPHRINE HCL 10 MG/ML IJ SOLN
INTRAMUSCULAR | Status: DC | PRN
  Administered 2011-09-15 (×4): 80 ug via INTRAVENOUS
  Administered 2011-09-15: 40 ug via INTRAVENOUS

## 2011-09-15 MED ORDER — CITRIC ACID-SODIUM CITRATE 334-500 MG/5ML PO SOLN
ORAL | Status: AC
  Filled 2011-09-15: qty 15

## 2011-09-15 MED ORDER — BUPIVACAINE HCL (PF) 0.5 % IJ SOLN
INTRAMUSCULAR | Status: AC
  Filled 2011-09-15: qty 30

## 2011-09-15 MED ORDER — ONDANSETRON HCL 4 MG/2ML IJ SOLN
INTRAMUSCULAR | Status: DC | PRN
  Administered 2011-09-15: 4 mg via INTRAVENOUS

## 2011-09-15 MED ORDER — CITRIC ACID-SODIUM CITRATE 334-500 MG/5ML PO SOLN
ORAL | Status: DC | PRN
  Administered 2011-09-15: 30 mL via ORAL

## 2011-09-15 MED ORDER — FENTANYL CITRATE 0.05 MG/ML IJ SOLN
INTRAMUSCULAR | Status: DC | PRN
  Administered 2011-09-15 (×3): 50 ug via INTRAVENOUS

## 2011-09-15 SURGICAL SUPPLY — 19 items
CATH ROBINSON RED A/P 16FR (CATHETERS) IMPLANT
CLOTH BEACON ORANGE TIMEOUT ST (SAFETY) ×2 IMPLANT
COUNTER NEEDLE 1200 MAGNETIC (NEEDLE) IMPLANT
GLOVE BIOGEL PI IND STRL 8.5 (GLOVE) ×1 IMPLANT
GLOVE BIOGEL PI INDICATOR 8.5 (GLOVE) ×1
GLOVE ECLIPSE 8.0 STRL XLNG CF (GLOVE) ×4 IMPLANT
GOWN PREVENTION PLUS LG XLONG (DISPOSABLE) ×2 IMPLANT
NEEDLE MA TROC 1/2 (NEEDLE) IMPLANT
NEEDLE MAYO .5 CIRCLE (NEEDLE) ×2 IMPLANT
NEEDLE SPNL 22GX3.5 QUINCKE BK (NEEDLE) IMPLANT
PACK VAGINAL MINOR WOMEN LF (CUSTOM PROCEDURE TRAY) ×2 IMPLANT
PAD PREP 24X48 CUFFED NSTRL (MISCELLANEOUS) ×2 IMPLANT
SUT MERSILENE 5MM BP 1 12 (SUTURE) ×2 IMPLANT
SYR CONTROL 10ML LL (SYRINGE) IMPLANT
TOWEL OR 17X24 6PK STRL BLUE (TOWEL DISPOSABLE) ×4 IMPLANT
TRAY FOLEY CATH 14FR (SET/KITS/TRAYS/PACK) IMPLANT
TUBING NON-CON 1/4 X 20 CONN (TUBING) IMPLANT
WATER STERILE IRR 1000ML POUR (IV SOLUTION) ×2 IMPLANT
YANKAUER SUCT BULB TIP NO VENT (SUCTIONS) IMPLANT

## 2011-09-15 NOTE — Progress Notes (Signed)
Subjective:  The patient reports that she is doing okay.  Objective:  Vital signs are stable Chest is clear Heart regular rate and rhythm Stable fetal heart tones  Assessment:  18/[redacted] week gestation with preterm cervical change. 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 We talked about the concern that she will rupture her membranes and/or deliver prior to viability if we do not attempt a cerclage. We also discussed the possibility that if we perform a cerclage, that we may rupture the membranes. We also discussed the possibility that she will develop chorioamnionitis as a result of the cerclage. I acknowledged that her decision is a difficult one. The risk and benefits of surgery were reviewed. The patient reports that she understands and accepts the above risks. She wants to proceed with rescue cerclage today.  Kathryn Howard.D.

## 2011-09-15 NOTE — Op Note (Signed)
OPERATIVE NOTE  Davanna Horkey  DOB:    1975/05/26  MRN:    [id number]  CSN:    [id number]  Date of Surgery:  09/15/2011  Preoperative Diagnosis:  18-1/[redacted] week gestation Cervical dilatation Questionable incompetent cervix Obesity  Postoperative Diagnosis:  18-1/[redacted] week gestation Cervical dilatation Questionable incompetent cervix Obesity  Procedure:  Rescue McDonald cerclage  Surgeon:  Leonard Schwartz, M.D.  Assistant:  None  Anesthetic:  Spinal  Disposition:  The patient is a 36 year old female who presented at 18-1/[redacted] weeks gestation with silent dilatation of her cervix. She understands the indications for her surgical procedure as well as her alternative treatment options. She understands that the chance of care in this pregnancy the viability without a cerclage are very low. She understands that, even with a cerclage, there is a chance that she will ruptured her membranes and still not be able to carry the pregnancy to viability. She understands that there is a significant risk that the membranes will rupture during the procedure. She understands and accepts the above risks. She also accepts the risk of, but not limited to, anesthetic complications, bleeding, infections, and possible damage to the surrounding organs.  Findings:  The uterus is approximately 20 week size. The cervix was 5 cm dilated and 75% effaced. The membranes were easily visible at the cervix. At the end of our procedure the cervix was closed.  Procedure:  The patient was taken to the operating room where a spinal anesthetic was given. The patient's lower abdomen, perineum, and vagina were prepped with multiple layers of Betadine. A catheter had previously been placed in the bladder. A speculum was placed in the vagina. The cervix was 5 cm dilated. The vagina was prepped with Betadine once again. Forceps were placed on the cervix. A 30 cc Foley catheter was placed inside the cervix and the Foley  bulb was inflated. The patient was placed in Trendelenburg position. The membranes receded into the uterine cavity. A Mersilene stitch was placed around the cervix. Care was taken not to rupture the membranes and not to rupture the Foley bulb. The stitch was tied in the posterior vagina. The Foley bulb was deflated and the catheter was removed from the uterine cavity. The patient was taken out of Trendelenburg. The membranes were then visualized at the cervical os. The cervix was approximately 1-2 cm dilated. I was concerned that the membranes would 2 through the cervix over time. The patient was placed back in Trendelenburg position. The Foley catheter was placed back in the cervix and the Foley bulb was inflated. A second Mersilene cerclage was placed circumferentially around the cervix. This time the Foley bulb was deflated and the catheter was removed. The knot was tied in the posterior vagina. The patient was taken out of Trendelenburg position. The cervix was thought to be closed. No membranes were visible. The first cerclage was removed. Again the cervix remained closed. Hemostasis was adequate. There was no evidence of leakage of fluid. Needle and sponge counts were correct. The patient was returned to the supine position. She was transported to the recovery room in stable condition. She tolerated her procedure well. The estimated blood loss was 20 cc.  Leonard Schwartz, M.D.

## 2011-09-15 NOTE — Progress Notes (Signed)
Kathryn Howard is a 36 y.o. G2P0010 at [redacted]w[redacted]d by  admitted for hour glassing membranes Subjective: Comfortable, denies cramping, srom, vag bleeding, with +FM  Objective: BP 105/48  Pulse 74  Temp(Src) 98.3 F (36.8 C) (Oral)  Resp 18  Ht 5\' 4"  (1.626 m)  Wt 238 lb 2 oz (108.013 kg)  BMI 40.87 kg/m2  SpO2 100% I/O last 3 completed shifts: In: -  Out: 6100 [Urine:6100]   Results for orders placed during the hospital encounter of 09/14/11 (from the past 48 hour(s))  URINALYSIS, ROUTINE W REFLEX MICROSCOPIC     Status: Abnormal   Collection Time   09/14/11  1:21 AM      Component Value Range Comment   Color, Urine YELLOW  YELLOW     APPearance CLEAR  CLEAR     Specific Gravity, Urine <1.005 (*) 1.005 - 1.030     pH 6.5  5.0 - 8.0     Glucose, UA NEGATIVE  NEGATIVE (mg/dL)    Hgb urine dipstick LARGE (*) NEGATIVE     Bilirubin Urine NEGATIVE  NEGATIVE     Ketones, ur NEGATIVE  NEGATIVE (mg/dL)    Protein, ur NEGATIVE  NEGATIVE (mg/dL)    Urobilinogen, UA 0.2  0.0 - 1.0 (mg/dL)    Nitrite NEGATIVE  NEGATIVE     Leukocytes, UA NEGATIVE  NEGATIVE    URINE CULTURE     Status: Normal   Collection Time   09/14/11  1:21 AM      Component Value Range Comment   Specimen Description URINE, CLEAN CATCH      Special Requests NONE      Culture  Setup Time [id number]      Colony Count NO GROWTH      Culture NO GROWTH      Report Status 09/15/2011 FINAL     URINE MICROSCOPIC-ADD ON     Status: Abnormal   Collection Time   09/14/11  1:21 AM      Component Value Range Comment   Squamous Epithelial / LPF FEW (*) RARE     WBC, UA 0-2  <3 (WBC/hpf)    RBC / HPF 3-6  <3 (RBC/hpf)    Bacteria, UA FEW (*) RARE    GC/CHLAMYDIA PROBE AMP, GENITAL     Status: Normal   Collection Time   09/14/11  1:45 AM      Component Value Range Comment   GC Probe Amp, Genital NEGATIVE  NEGATIVE     Chlamydia, DNA Probe NEGATIVE  NEGATIVE    WET PREP, GENITAL     Status: Abnormal   Collection Time   09/14/11   1:45 AM      Component Value Range Comment   Yeast Wet Prep HPF POC NONE SEEN  NONE SEEN     Trich, Wet Prep NONE SEEN  NONE SEEN     Clue Cells Wet Prep HPF POC NONE SEEN  NONE SEEN     WBC, Wet Prep HPF POC FEW (*) NONE SEEN  FEW BACTERIA SEEN  CULTURE, BETA STREP (GROUP B ONLY)     Status: Normal (Preliminary result)   Collection Time   09/14/11  4:10 AM      Component Value Range Comment   Specimen Description VAGINAL/RECTAL      Special Requests NONE      Culture Culture reincubated for better growth      Report Status PENDING     CBC     Status: Abnormal  Collection Time   09/14/11  4:25 AM      Component Value Range Comment   WBC 12.2 (*) 4.0 - 10.5 (K/uL)    RBC 4.18  3.87 - 5.11 (MIL/uL)    Hemoglobin 11.0 (*) 12.0 - 15.0 (g/dL)    HCT 16.1 (*) 09.6 - 46.0 (%)    MCV 80.1  78.0 - 100.0 (fL)    MCH 26.3  26.0 - 34.0 (pg)    MCHC 32.8  30.0 - 36.0 (g/dL)    RDW 04.5  40.9 - 81.1 (%)    Platelets 229  150 - 400 (K/uL)   DIFFERENTIAL     Status: Abnormal   Collection Time   09/14/11  4:25 AM      Component Value Range Comment   Neutrophils Relative 81 (*) 43 - 77 (%)    Neutro Abs 9.8 (*) 1.7 - 7.7 (K/uL)    Lymphocytes Relative 15  12 - 46 (%)    Lymphs Abs 1.8  0.7 - 4.0 (K/uL)    Monocytes Relative 4  3 - 12 (%)    Monocytes Absolute 0.5  0.1 - 1.0 (K/uL)    Eosinophils Relative 0  0 - 5 (%)    Eosinophils Absolute 0.0  0.0 - 0.7 (K/uL)    Basophils Relative 0  0 - 1 (%)    Basophils Absolute 0.0  0.0 - 0.1 (K/uL)   GLUCOSE, RANDOM     Status: Normal   Collection Time   09/14/11  4:25 AM      Component Value Range Comment   Glucose, Bld 89  70 - 99 (mg/dL)   GLUCOSE, CAPILLARY     Status: Abnormal   Collection Time   09/14/11 10:45 AM      Component Value Range Comment   Glucose-Capillary 105 (*) 70 - 99 (mg/dL)   GLUCOSE, CAPILLARY     Status: Abnormal   Collection Time   09/14/11  4:17 PM      Component Value Range Comment   Glucose-Capillary 119 (*) 70 -  99 (mg/dL)    Comment 1 Notify RN      Comment 2 Documented in Chart     GLUCOSE, CAPILLARY     Status: Normal   Collection Time   09/14/11 10:29 PM      Component Value Range Comment   Glucose-Capillary 94  70 - 99 (mg/dL)   GLUCOSE, CAPILLARY     Status: Normal   Collection Time   09/15/11  6:40 AM      Component Value Range Comment   Glucose-Capillary 87  70 - 99 (mg/dL)   GLUCOSE, CAPILLARY     Status: Normal   Collection Time   09/15/11 11:56 AM      Component Value Range Comment   Glucose-Capillary 90  70 - 99 (mg/dL)    Comment 1 Notify RN      Comment 2 Documented in Chart      Physical Exam:  Gen: alert, cooperative, no distress Chest/Lungs: cta bilaterally  Heart/Pulse: RRR  Abdomen: soft, gravid, nontender, BX x4 quad Uterine fundus: soft, nontender Skin & Color: warm and dry  Neurological: AOx3, DTRs not assessed EXT: negative Homan's b/l, edema none  FHT:  present UC:   none SVE:   Dilation: 3.5 Exam by:: HILIARY, CNM    Labs: Lab Results  Component Value Date   WBC 12.2* 09/14/2011   HGB 11.0* 09/14/2011   HCT 33.5* 09/14/2011   MCV  80.1 09/14/2011   PLT 229 09/14/2011   CBG (last 3)   Basename 09/15/11 1156 09/15/11 0640 09/14/11 2229  GLUCAP 90 87 94     Assessment and Plan: has Type 2 diabetes mellitus; Increased BMI; Hx of infertility; Female circumcision; History of PCOS; H/O miscarriage, currently pregnant; and Incompetent cervix in pregnancy, antepartum on her problem list. 18 3/7 week IUP Has questions regarding cerclage, will discuss with Dr. Stefano Gaul. Continue care.  Kathryn Howard 09/15/2011, 12:09 PM

## 2011-09-15 NOTE — Anesthesia Procedure Notes (Signed)
Spinal  Patient location during procedure: OR Start time: 09/15/2011 5:35 PM Staffing Performed by: anesthesiologist  Preanesthetic Checklist Completed: patient identified, site marked, surgical consent, pre-op evaluation, timeout performed, IV checked, risks and benefits discussed and monitors and equipment checked Spinal Block Patient position: sitting Prep: site prepped and draped and DuraPrep Patient monitoring: heart rate, cardiac monitor, continuous pulse ox and blood pressure Approach: midline Location: L3-4 Injection technique: single-shot Needle Needle type: Sprotte  Needle gauge: 24 G Needle length: 9 cm Assessment Sensory level: T4 Additional Notes Clear free flow CSF on first attempt.  No paresthesia.  Patient tolerated procedure well. Jasmine December, MD

## 2011-09-15 NOTE — Progress Notes (Signed)
Subjective: Reason for consultation: Diabetes management  This is a 36 year old lady who has had diabetes since about 2008 and has been managed during her pregnancy with metformin and glyburide. She had been taking metformin prior to her pregnancy and glyburide was added. She was last seen in the office in April and her A1c was 5.7 %. At that time she was having somewhat higher readings after supper without median off 134. However she was not having any high readings in the mornings with a median off 85 before breakfast she generally eats only one meal a day in the evening. Because of her higher) and readings she was started on NovoLog insulin 3 units before supper. She has been monitoring her blood sugar fairly regularly after supper and has had only one reading over 130 but most of them are reading below 120. She has done well but taking insulin regularly before supper and is generally watching her diet as instructed. Since her last visit also she has started eating small meals at breakfast and lunch.  Prior to her admission she has kept a blood sugar record and this shows fairly good readings throughout the day with morning readings consistently below 90 except for once. Her postprandial readings as above also fairly good and she has not needed to increase her insulin beyond the 3 units. Since her hospitalization also her blood sugars have been consistently below 100 Nonfasting. No clear if readings are being checked 2 hours after meals. Currently the patient is at bedrest and also will be hospitalized for quite sometime. Currently she is only [redacted] weeks pregnant  Past Medical History  Diagnosis Date  . Vitamin d deficiency   . Obesity   . Gastroparesis   . Diabetes mellitus   . Acid reflux   . Fatty liver   . DUB (dysfunctional uterine bleeding)   . H pylori ulcer   . H/O rubella   . H/O varicella   . Depression     stopped meds 2012  . History of anxiety   . History of PCOS 03/07/10  .  Oligomenorrhea 09/12/10  . Pelvic pain 03/14/11    right sided back  . H/O hematuria 03/14/11  . History of ovarian cyst   . Yeast infection    History   Social History  . Marital Status: Married    Spouse Name: N/A    Number of Children: N/A  . Years of Education: N/A   Occupational History  . Not on file.   Social History Main Topics  . Smoking status: Never Smoker   . Smokeless tobacco: Never Used  . Alcohol Use: No  . Drug Use: No  . Sexually Active: Yes   Other Topics Concern  . Not on file   Social History Narrative  . No narrative on file    Objective: Vital signs in last 24 hours: Temp:  [98.1 F (36.7 C)-98.4 F (36.9 C)] 98.3 F (36.8 C) (05/17 1154) Pulse Rate:  [74-113] 74  (05/17 1154) Resp:  [18] 18  (05/17 1154) BP: (105-117)/(46-62) 105/48 mmHg (05/17 1154) SpO2:  [99 %-100 %] 100 % (05/16 2135) Weight change:     Intake/Output from previous day: 05/16 0701 - 05/17 0700 In: -  Out: 6100 [Urine:6100] Intake/Output this shift:      Lab Results:  Basename 09/14/11 0425  WBC 12.2*  HGB 11.0*  HCT 33.5*  PLT 229   BMET  Basename 09/14/11 0425  NA --  K --  CL --  CO2 --  GLUCOSE 89  BUN --  CREATININE --  CALCIUM --    Studies/Results: US Ob Detail + 14 Wk  09/14/2011  OBSTETRICAL ULTRASOUND: This exam was performed within a [facility]. The OB US report was generated in the AS system, and faxed to the ordering physician.   This report is also available in [employer] and in the [employer]. See AS Obstetric US report.   US Ob Follow Up  09/14/2011  OBSTETRICAL ULTRASOUND: This exam was performed within a [facility]. The OB US report was generated in the AS system, and faxed to the ordering physician.   This report is also available in [employer] and in the [employer]. See AS Obstetric US report.    Medications: I have reviewed the  patient's current medications.  Assessment/Plan: The patient has excellent blood sugar control even with not resuming her mealtime insulin and glyburide yesterday. Currently is on metformin ER which she is tolerating well but it this has been reported safe in pregnancy and will continue. She may need more intensive insulin therapy if down the road but since her blood sugars are looking excellent would not Change her regimen. I will followup her blood glucose profile in 2 days to see if her basal insulin as needed with stopping her glyburide.   LOS: 1 day   Saachi Zale 09/15/2011, 1:39 PM

## 2011-09-15 NOTE — Progress Notes (Signed)
Visited with pt at nurse's referral.  Kathryn Howard has had a history of loss of several miscarriages and after 10 years of marriage, this pregnancy came as a welcome gift that they were not expecting.  They are very nervous about this pregnancy because they have had so much difficulty with pregnancy in the past and she and her husband are each coping with their anxiety in different ways.  I offered compassionate listening, prayer and affirmation.    Please page as needs arise or as pt requests. [phone]  Chaplain Dyanne Carrel 3:51 PM  09/15/11 1500  Clinical Encounter Type  Visited With Patient  Visit Type Initial;Spiritual support  Referral From Nurse  Spiritual Encounters  Spiritual Needs Emotional;Prayer  Stress Factors  Patient Stress Factors Family relationships (History of loss)

## 2011-09-15 NOTE — Anesthesia Postprocedure Evaluation (Signed)
  Anesthesia Post-op Note  Patient: Kathryn Howard  Procedure(s) Performed: Procedure(s) (LRB): CERCLAGE CERVICAL (N/A)  Patient Location: PACU  Anesthesia Type: Spinal  Level of Consciousness: awake, alert  and oriented  Airway and Oxygen Therapy: Patient Spontanous Breathing  Post-op Pain: none  Post-op Assessment: Post-op Vital signs reviewed, Patient's Cardiovascular Status Stable, Respiratory Function Stable, Patent Airway, No signs of Nausea or vomiting, Adequate PO intake, Pain level controlled, No headache, No backache, No residual numbness and No residual motor weakness  Post-op Vital Signs: Reviewed and stable  Complications: No apparent anesthesia complications

## 2011-09-15 NOTE — Progress Notes (Signed)
Called [place] CNM to clarify diet order. Ok for pt to eat at this time as tolerated carb modified diet.  Also orders to hold Metformin tonight and resume tomorrow due to NPO status today.

## 2011-09-15 NOTE — Progress Notes (Signed)
Admitted at  [redacted] weeks gestation, with incompetent cervix.  Height  64 Weight 238 Lbs pre-pregnancy weight 243 Lbs.Pre-pregnancy  BMI 41.7  IBW 120 Lbs  Total weight gain none, 5 Lb weight loss. Weight gain goals 11-20 Lbs.   Estimated needs: 2000-2200 kcal/day, 75-85 grams protein/day, 2.3 liters fluid/day Carbohydrate modified gestational diet tolerated well, appetite good. No recent N/V. In trendelenburg. Currently NPO prior to surgery CHO modified  diet prescription will provide for increased needs. Patient was provide with snack menu. No abnormal nutrition related labs.  CBG (last 3)   Basename 09/15/11 1156 09/15/11 0640 09/14/11 2229  GLUCAP 90 87 94     Nutrition Dx: Increased nutrient needs r/t pregnancy and fetal growth requirements aeb [redacted] weeks gestation.  No educational needs assessed at this time. Pt has been followed by Nutritionist at [facility] ( March 2013)

## 2011-09-15 NOTE — Anesthesia Preprocedure Evaluation (Signed)
Anesthesia Evaluation  Patient identified by MRN, date of birth, ID band Patient awake    Reviewed: Allergy & Precautions, H&P , NPO status , Patient's Chart, lab work & pertinent test results, reviewed documented beta blocker date and time   History of Anesthesia Complications Negative for: history of anesthetic complications  Airway Mallampati: III TM Distance: >3 FB Neck ROM: full    Dental  (+) Teeth Intact   Pulmonary neg pulmonary ROS,  breath sounds clear to auscultation        Cardiovascular negative cardio ROS  Rhythm:regular Rate:Normal     Neuro/Psych PSYCHIATRIC DISORDERS (h/o anxiety/depression) negative neurological ROS     GI/Hepatic Neg liver ROS, PUD, GERD- (with pregnancy)  ,  Endo/Other  Diabetes mellitus- (since age 35, on insulin with pregnancy only), Type 2, Oral Hypoglycemic Agents and Insulin DependentMorbid obesity  Renal/GU negative Renal ROS  negative genitourinary   Musculoskeletal   Abdominal   Peds  Hematology negative hematology ROS (+)   Anesthesia Other Findings NPO since 9:30 am - ate eggs and toast  Reproductive/Obstetrics (+) Pregnancy (18 week, incompetent cervix)                           Anesthesia Physical Anesthesia Plan  ASA: III  Anesthesia Plan: Spinal   Post-op Pain Management:    Induction:   Airway Management Planned:   Additional Equipment:   Intra-op Plan:   Post-operative Plan:   Informed Consent: I have reviewed the patients History and Physical, chart, labs and discussed the procedure including the risks, benefits and alternatives for the proposed anesthesia with the patient or authorized representative who has indicated his/her understanding and acceptance.     Plan Discussed with: Surgeon and CRNA  Anesthesia Plan Comments:         Anesthesia Quick Evaluation

## 2011-09-15 NOTE — Plan of Care (Signed)
PACU called ad gave report on pt. Pt will be back on unit around 2030.

## 2011-09-16 LAB — GLUCOSE, CAPILLARY
Glucose-Capillary: 276 mg/dL — ABNORMAL HIGH (ref 70–99)
Glucose-Capillary: 89 mg/dL (ref 70–99)
Glucose-Capillary: 103 mg/dL — ABNORMAL HIGH (ref 70–99)
Glucose-Capillary: 94 mg/dL (ref 70–99)

## 2011-09-16 LAB — CULTURE, BETA STREP (GROUP B ONLY)

## 2011-09-16 MED ORDER — HYDROXYPROGESTERONE CAPROATE 250 MG/ML IM OIL
250.0000 mg | TOPICAL_OIL | Freq: Once | INTRAMUSCULAR | Status: DC
  Filled 2011-09-16: qty 1

## 2011-09-16 MED ORDER — HYDROXYPROGESTERONE CAPROATE 250 MG/ML IM OIL
250.0000 mg | TOPICAL_OIL | INTRAMUSCULAR | Status: DC
  Administered 2011-09-16 – 2011-10-14 (×5): 250 mg via INTRAMUSCULAR
  Filled 2011-09-16 (×5): qty 1

## 2011-09-16 MED ORDER — AMOXICILLIN 500 MG PO CAPS
500.0000 mg | ORAL_CAPSULE | Freq: Two times a day (BID) | ORAL | Status: DC
  Administered 2011-09-16 – 2011-09-17 (×3): 500 mg via ORAL
  Filled 2011-09-16 (×3): qty 1

## 2011-09-16 MED ORDER — POLYETHYLENE GLYCOL 3350 17 G PO PACK
17.0000 g | PACK | Freq: Every day | ORAL | Status: DC
  Administered 2011-09-16 – 2011-10-17 (×32): 17 g via ORAL
  Filled 2011-09-16 (×35): qty 1

## 2011-09-16 MED ORDER — INSULIN ASPART 100 UNIT/ML ~~LOC~~ SOLN
3.0000 [IU] | Freq: Every day | SUBCUTANEOUS | Status: DC
  Administered 2011-09-16 – 2011-09-20 (×5): 3 [IU] via SUBCUTANEOUS

## 2011-09-16 NOTE — Progress Notes (Addendum)
36 y.o. year old female,at [redacted]w[redacted]d gestation. Postoperative day 1 from rescue cerclage  SUBJECTIVE:  The patient reports that she is doing well. She does complain of constipation.  OBJECTIVE:  BP 107/58  Pulse 86  Temp(Src) 98.4 F (36.9 C) (Oral)  Resp 20  Ht 5\' 4"  (1.626 m)  Wt 108.013 kg (238 lb 2 oz)  BMI 40.87 kg/m2  SpO2 100%  Fetal Heart Tones:  150 beats per minute  Contractions:          None  Chest: Clear Heart: Regular rate and rhythm Abdomen: Nontender Extremities: Within normal limits Bleeding: Minimal  Fasting blood sugar: 89  GC: Negative Chlamydia: Negative Urine culture: Negative Beta strep: Recultured   ASSESSMENT:  [redacted]w[redacted]d Weeks Pregnancy  Status post rescue cerclage day 1  Diabetes  Constipation  PLAN:  Begin 17P. 250 mg IM each week call to [facility] [phone]). Her husband will pick up the medication and bring the [facility] for injection. Prune juice and MiraLAX for constipation Begin  amoxicillin pills Continue bedrest in-hospital  Leonard Schwartz, M.D.

## 2011-09-16 NOTE — Progress Notes (Signed)
1235- of clear yellow urine emptied from foleycath.

## 2011-09-16 NOTE — Progress Notes (Signed)
1049- of clear yeiiow urine emptied from foleycath.

## 2011-09-16 NOTE — Progress Notes (Signed)
Called Provider to report CBG and temp.  CNM will consult with Dr. Stefano Gaul and call back.

## 2011-09-16 NOTE — Anesthesia Postprocedure Evaluation (Signed)
  Anesthesia Post-op Note  Patient: Kathryn Howard  Procedure(s) Performed: Procedure(s) (LRB): CERCLAGE CERVICAL (N/A)  Patient Location: Antenatal  Anesthesia Type: Spinal  Level of Consciousness: awake, alert  and oriented  Airway and Oxygen Therapy: Patient Spontanous Breathing  Post-op Pain: none  Post-op Assessment: Patient's Cardiovascular Status Stable and Respiratory Function Stable  Post-op Vital Signs: stable  Complications: No apparent anesthesia complications

## 2011-09-16 NOTE — Progress Notes (Signed)
Orders to give Metformin now for CBG

## 2011-09-16 NOTE — Progress Notes (Signed)
1800- 780 ml of clear yellow urine emptied from foleycath.

## 2011-09-17 LAB — GLUCOSE, CAPILLARY
Glucose-Capillary: 149 mg/dL — ABNORMAL HIGH (ref 70–99)
Glucose-Capillary: 86 mg/dL (ref 70–99)

## 2011-09-17 MED ORDER — DOCUSATE SODIUM 100 MG PO CAPS
100.0000 mg | ORAL_CAPSULE | Freq: Every day | ORAL | Status: DC
  Administered 2011-09-19 – 2011-10-17 (×30): 100 mg via ORAL
  Filled 2011-09-17 (×33): qty 1

## 2011-09-17 NOTE — Progress Notes (Signed)
Subjective: Diabetes followup. The patient is currently maintained on metformin only and her blood sugars appear to be within the range. Fasting glucose today is 82 without any basal insulin being started. Currently blood sugars are normal before meals and her on 9:30 PM. Currently patient is on a diabetic diet and is not ambulating  Objective: Vital signs in last 24 hours: Temp:  [98.2 F (36.8 C)-99.3 F (37.4 C)] 98.2 F (36.8 C) (05/19 1208) Pulse Rate:  [73-99] 86  (05/19 1208) Resp:  [16-20] 20  (05/19 1208) BP: (99-115)/(51-72) 113/64 mmHg (05/19 1208) Weight change:    CBG (last 3)   Basename 09/17/11 0605 09/16/11 2129 09/16/11 1655  GLUCAP 82 94 103*    Intake/Output from previous day:   Intake/Output this shift: Total I/O In: -  Out: 450 [Urine:450]  Physical exam not indicated  Lab Results: No results found for this basename: WBC:2,HGB:2,HCT:2,PLT:2 in the last 72 hours BMET No results found for this basename: NA:2,K:2,CL:2,CO2:2,GLUCOSE:2,BUN:2,CREATININE:2,CALCIUM:2 in the last 72 hours  Studies/Results: No results found.  Medications: I have reviewed the patient's current medications.  Assessment/Plan: Patient's blood sugar appears to be well-controlled without any additional changes and even with stopping glyburide. We'll continue to monitor and adjust insulin if needed. Currently her blood sugar after supper is also quite normal. Will plan to see the patient as needed. However if currently the blood sugars are being monitored before meals even though the order is for 2 hours after meals. Will change the times when blood sugars are being monitored  LOS: 3 days   Tc Kapusta 09/17/2011, 12:36 PM

## 2011-09-17 NOTE — Progress Notes (Signed)
36 y.o. year old female,at [redacted]w[redacted]d gestation.  SUBJECTIVE:  Feels ok. Nightmares last night. She has a history of anxiety and depression.  OBJECTIVE:  BP 104/51  Pulse 73  Temp(Src) 98.3 F (36.8 C) (Oral)  Resp 18  Ht 5\' 4"  (1.626 m)  Wt 108.013 kg (238 lb 2 oz)  BMI 40.87 kg/m2  SpO2 100%  Fetal Heart Tones:  150  Contractions:          none  Abd: soft Lochia: none  Beta Strep negative   ASSESSMENT:  [redacted]w[redacted]d Weeks Pregnancy  Incompetent cervix. Doing well two days after rescue cerclage.  Well controlled diabetes.  Stable anxiety   PLAN:  Recreational therapy consult. Stop amoxicillin. Continue in hospital support.  Leonard Schwartz, M.D.

## 2011-09-18 ENCOUNTER — Encounter (HOSPITAL_COMMUNITY): Payer: Self-pay | Admitting: Obstetrics and Gynecology

## 2011-09-18 LAB — CHLAMYDIA TRACHOMATIS, DNA, AMP PROBE: Chlamydia, DNA Probe: NEGATIVE

## 2011-09-18 LAB — GLUCOSE, CAPILLARY: Glucose-Capillary: 113 mg/dL — ABNORMAL HIGH (ref 70–99)

## 2011-09-18 NOTE — Transfer of Care (Signed)
Immediate Anesthesia Transfer of Care Note  Patient: Kathryn Howard  Procedure(s) Performed: Procedure(s) (LRB): CERCLAGE CERVICAL (N/A)  Patient Location: PACU  Anesthesia Type: Spinal  Level of Consciousness: awake  Airway & Oxygen Therapy: Patient Spontanous Breathing  Post-op Assessment: Report given to PACU RN  Post vital signs: Reviewed and stable  Complications: No apparent anesthesia complications

## 2011-09-18 NOTE — Progress Notes (Signed)
This was a follow-up visit with Kathryn Howard: I saw her on Friday before her procedure.  [name] was feeling in many ways relieved that all went well with her procedure and she was trying to focus on positive thinking for the future of her pregnancy.  We spoke about the different ways that she and her husband are coping with their anxiety about the pregnancy which is made more complicated by their previous loss and by the focus of their faith community on having children.  I offered compassionate listening, affirmation and helped her to reflect on her communication with her husband throughout this process so that they can support each other.  Please page as needed or as Kathryn Howard requests.  [phone]  Kathryn Howard Kathryn Howard 11:15   09/18/11 1300  Clinical Encounter Type  Visited With Patient  Visit Type Follow-up;Spiritual support  Referral From Nurse  Spiritual Encounters  Spiritual Needs Emotional  Stress Factors  Patient Stress Factors Family relationships;Loss of control

## 2011-09-18 NOTE — Progress Notes (Signed)
UR chart review completed.  

## 2011-09-19 LAB — GLUCOSE, CAPILLARY: Glucose-Capillary: 84 mg/dL (ref 70–99)

## 2011-09-19 NOTE — Progress Notes (Addendum)
Kathryn Howard is a 36 y.o. G2P0010 at [redacted]w[redacted]d admitted for incompetent s/p rescue cerclage.   Subjective: Pt was sleeping when CNM went to see pt.  Said pt was not disturbed.  Objective: AFVSS   Physical Exam:  Pt was sleeping and not disturbed  FHT:  158 UC:   none SVE:   Deferred - pt is s/p cerclage  Labs: Lab Results  Component Value Date   WBC 12.2* 09/14/2011   HGB 11.0* 09/14/2011   HCT 33.5* 09/14/2011   MCV 80.1 09/14/2011   PLT 229 09/14/2011   09/14/11 labs GBS/GC/CT all neg Urine Cx neg  Assessment and Plan: P0 @ 18 6/7wks s/p rescue cerclage.  Currently stable on strict bedrest.  Plan per primary OB provider re inpt or outpt mgmt. Diabetes - managed by Dr. Lucianne Muss and under control on metformin. Need to check as ordered fasting CBGs and 2hr after meals.  Kathryn Howard Y 09/19/2011, 8:51 AM

## 2011-09-19 NOTE — Progress Notes (Signed)
Patient ID: Kathryn Howard, female   DOB: Nov 18, 1975, 36 y.o.   MRN: [id number] Pt without complaints.  No leakage of fluid or VB.  Good FM  BP 115/65  Pulse 93  Temp(Src) 98.1 F (36.7 C) (Oral)  Resp 20  Ht 5\' 4"  (1.626 m)  Wt 238 lb 2 oz (108.013 kg)  BMI 40.87 kg/m2  SpO2 100%  FHTS 143  Toco none  Pt in NAD CV RRR Lungs CTAB abd  Gravid soft and NT GU no vb EXt no calf tenderness Results for orders placed during the hospital encounter of 09/14/11 (from the past 72 hour(s))  GLUCOSE, CAPILLARY     Status: Normal   Collection Time   09/16/11  9:29 PM      Component Value Range Comment   Glucose-Capillary 94  70 - 99 (mg/dL)   GLUCOSE, CAPILLARY     Status: Normal   Collection Time   09/17/11  6:05 AM      Component Value Range Comment   Glucose-Capillary 82  70 - 99 (mg/dL)   GLUCOSE, CAPILLARY     Status: Abnormal   Collection Time   09/17/11  1:30 PM      Component Value Range Comment   Glucose-Capillary 149 (*) 70 - 99 (mg/dL)    Comment 1 Documented in Chart     GLUCOSE, CAPILLARY     Status: Abnormal   Collection Time   09/17/11  5:56 PM      Component Value Range Comment   Glucose-Capillary 101 (*) 70 - 99 (mg/dL)   GLUCOSE, CAPILLARY     Status: Normal   Collection Time   09/17/11 10:22 PM      Component Value Range Comment   Glucose-Capillary 86  70 - 99 (mg/dL)   GLUCOSE, CAPILLARY     Status: Normal   Collection Time   09/18/11  8:01 AM      Component Value Range Comment   Glucose-Capillary 76  70 - 99 (mg/dL)   GLUCOSE, CAPILLARY     Status: Normal   Collection Time   09/18/11 11:23 AM      Component Value Range Comment   Glucose-Capillary 97  70 - 99 (mg/dL)   GLUCOSE, CAPILLARY     Status: Normal   Collection Time   09/18/11  4:44 PM      Component Value Range Comment   Glucose-Capillary 99  70 - 99 (mg/dL)   GLUCOSE, CAPILLARY     Status: Abnormal   Collection Time   09/18/11  9:38 PM      Component Value Range Comment   Glucose-Capillary 113 (*)  70 - 99 (mg/dL)   GLUCOSE, CAPILLARY     Status: Normal   Collection Time   09/19/11  6:51 AM      Component Value Range Comment   Glucose-Capillary 84  70 - 99 (mg/dL)   GLUCOSE, CAPILLARY     Status: Abnormal   Collection Time   09/19/11 11:03 AM      Component Value Range Comment   Glucose-Capillary 114 (*) 70 - 99 (mg/dL)    Comment 1 Notify RN      Comment 2 Documented in Chart       Assessment and Plan [redacted]w[redacted]d Incompetent cervix  Pt stable continue care

## 2011-09-20 LAB — GLUCOSE, CAPILLARY: Glucose-Capillary: 102 mg/dL — ABNORMAL HIGH (ref 70–99)

## 2011-09-20 NOTE — Progress Notes (Signed)
36 y.o. year old female,at [redacted]w[redacted]d gestation.  SUBJECTIVE:  Patient reports that she is doing better. She denies bleeding and leakage of fluid. Her pelvic pressure has resolved. She had a bowel movement 2 days ago.  OBJECTIVE:  BP 111/71  Pulse 78  Temp(Src) 98.1 F (36.7 C) (Oral)  Resp 20  Ht 5\' 4"  (1.626 m)  Wt 106.096 kg (233 lb 14.4 oz)  BMI 40.15 kg/m2  SpO2 100%  Fetal Heart Tones:  145  Contractions:          None  Abdomen: Soft and nontender Extremities: No masses  ASSESSMENT:  [redacted]w[redacted]d Weeks Pregnancy  Incompetent cervix  Status post rescue cerclage 5 days ago  PLAN:  The patient will begin to make plans with her family and her social support network. We will investigate ways that the patient can be followed as an outpatient.  Leonard Schwartz, M.D.

## 2011-09-21 ENCOUNTER — Encounter: Payer: Managed Care, Other (non HMO) | Admitting: Obstetrics and Gynecology

## 2011-09-21 ENCOUNTER — Other Ambulatory Visit: Payer: Managed Care, Other (non HMO)

## 2011-09-21 LAB — GLUCOSE, CAPILLARY: Glucose-Capillary: 90 mg/dL (ref 70–99)

## 2011-09-21 MED ORDER — PROGESTERONE MICRONIZED 200 MG PO CAPS
200.0000 mg | ORAL_CAPSULE | Freq: Every day | ORAL | Status: DC
  Filled 2011-09-21: qty 1

## 2011-09-21 MED ORDER — INSULIN ASPART 100 UNIT/ML ~~LOC~~ SOLN
4.0000 [IU] | Freq: Every day | SUBCUTANEOUS | Status: DC
  Administered 2011-09-21 – 2011-10-17 (×27): 4 [IU] via SUBCUTANEOUS

## 2011-09-21 NOTE — Progress Notes (Signed)
This was a follow-up visit with pt.  Kathryn Howard is in good spirits today--she is trying to figure out how to use her time her in a meaningful way.  She spends a significant amount of time alone because her husband is working and is having a difficult time being here at the hospital.  She is concerned about how to continue on complete bedrest if she were to be sent home because her husband is out of the house for long hours.  They are trying to figure out if family could come from [place] to help out, but they need to apply for a visa for them.    I offered compassionate listening and reflection.    Please page as needed, [phone].  Chaplain Orpha Bur Dre Gamino 10:50 AM   09/21/11 1000  Clinical Encounter Type  Visited With Patient  Visit Type Follow-up  Spiritual Encounters  Spiritual Needs Emotional

## 2011-09-21 NOTE — Progress Notes (Signed)
UR Chart review completed.  

## 2011-09-21 NOTE — Progress Notes (Addendum)
Hospital day # 7 pregnancy at [redacted]w[redacted]d--Cervical insufficiency, S/P cerclage 09/15/11  S:  Doing well, reports good fetal activity      Perception of contractions: none      Vaginal bleeding: none       Vaginal discharge:  no significant change      In good spirits  O: BP 104/48  Pulse 79  Temp(Src) 99 F (37.2 C) (Oral)  Resp 18  Ht 5\' 4"  (1.626 m)  Wt 233 lb 14.4 oz (106.096 kg)  BMI 40.15 kg/m2  SpO2 100%      Fetal tracings: 155 by doppler      Contractions:   None per patient      Uterus non-tender      Extremities: no significant edema and no signs of DVT          Labs:   Results for orders placed during the hospital encounter of 09/14/11 (from the past 24 hour(s))  GLUCOSE, CAPILLARY     Status: Abnormal   Collection Time   09/20/11  1:28 PM      Component Value Range   Glucose-Capillary 106 (*) 70 - 99 (mg/dL)   Comment 1 Notify RN     Comment 2 Documented in Chart    GLUCOSE, CAPILLARY     Status: Abnormal   Collection Time   09/20/11  5:48 PM      Component Value Range   Glucose-Capillary 134 (*) 70 - 99 (mg/dL)   Comment 1 Documented in Chart    GLUCOSE, CAPILLARY     Status: Abnormal   Collection Time   09/20/11  9:13 PM      Component Value Range   Glucose-Capillary 102 (*) 70 - 99 (mg/dL)   Comment 1 Notify RN     Comment 2 Documented in Chart    GLUCOSE, CAPILLARY     Status: Normal   Collection Time   09/21/11  6:41 AM      Component Value Range   Glucose-Capillary 95  70 - 99 (mg/dL)    Significant Meds:  Metformin 2000 mg po q supper                   Novolog 3 units at supper                   17 P weekly on Saturday                    Macrobid 100 mg po qhs  A: [redacted]w[redacted]d with cervical insufficiency, s/p cerclage 09/15/11      Type 2 diabetes      Stable  P: Continue current plan of care      MDs will follow  Nigel Bridgeman CNM, [place] 09/21/2011 10:02 AM    Will increase ac Novolog by 1 unit daily to achieve 2 hr pc dinner <120.  Increase to 4 units  tonight Will start vaginal progesterone 200 mg hs Keep pt in house until 24 weeks at fetal viability. Will maintain foley until 2 wks post cerclage then D/C on 09/29/11 if still stable

## 2011-09-22 LAB — GLUCOSE, CAPILLARY
Glucose-Capillary: 81 mg/dL (ref 70–99)
Glucose-Capillary: 116 mg/dL — ABNORMAL HIGH (ref 70–99)

## 2011-09-22 NOTE — Progress Notes (Signed)
Patient ID: Kathryn Howard, female   DOB: [birth year], 36 y.o.   MRN: [id number] Pt without complaints.  No leakage of fluid or VB.  Good FM  BP 98/53  Pulse 81  Temp(Src) 98.4 F (36.9 C) (Oral)  Resp 20  Ht 5\' 4"  (1.626 m)  Wt 233 lb 14.4 oz (106.096 kg)  BMI 40.15 kg/m2  SpO2 100%  FHTS 155  Toco none  Pt in NAD CV RRR Lungs CTAB abd  Gravid soft and NT GU no vb EXt no calf tenderness Results for orders placed during the hospital encounter of 09/14/11 (from the past 72 hour(s))  GLUCOSE, CAPILLARY     Status: Abnormal   Collection Time   09/19/11  4:59 PM      Component Value Range Comment   Glucose-Capillary 131 (*) 70 - 99 (mg/dL)    Comment 1 Notify RN      Comment 2 Documented in Chart     GLUCOSE, CAPILLARY     Status: Normal   Collection Time   09/19/11  9:58 PM      Component Value Range Comment   Glucose-Capillary 80  70 - 99 (mg/dL)   GLUCOSE, CAPILLARY     Status: Normal   Collection Time   09/20/11  6:22 AM      Component Value Range Comment   Glucose-Capillary 82  70 - 99 (mg/dL)   GLUCOSE, CAPILLARY     Status: Abnormal   Collection Time   09/20/11  1:28 PM      Component Value Range Comment   Glucose-Capillary 106 (*) 70 - 99 (mg/dL)    Comment 1 Notify RN      Comment 2 Documented in Chart     GLUCOSE, CAPILLARY     Status: Abnormal   Collection Time   09/20/11  5:48 PM      Component Value Range Comment   Glucose-Capillary 134 (*) 70 - 99 (mg/dL)    Comment 1 Documented in Chart     GLUCOSE, CAPILLARY     Status: Abnormal   Collection Time   09/20/11  9:13 PM      Component Value Range Comment   Glucose-Capillary 102 (*) 70 - 99 (mg/dL)    Comment 1 Notify RN      Comment 2 Documented in Chart     GLUCOSE, CAPILLARY     Status: Normal   Collection Time   09/21/11  6:41 AM      Component Value Range Comment   Glucose-Capillary 95  70 - 99 (mg/dL)   GLUCOSE, CAPILLARY     Status: Normal   Collection Time   09/21/11 11:39 AM      Component Value  Range Comment   Glucose-Capillary 90  70 - 99 (mg/dL)    Comment 1 Documented in Chart      Comment 2 Notify RN     GLUCOSE, CAPILLARY     Status: Abnormal   Collection Time   09/21/11  4:56 PM      Component Value Range Comment   Glucose-Capillary 106 (*) 70 - 99 (mg/dL)    Comment 1 Documented in Chart      Comment 2 Notify RN     GLUCOSE, CAPILLARY     Status: Normal   Collection Time   09/21/11 10:06 PM      Component Value Range Comment   Glucose-Capillary 92  70 - 99 (mg/dL)   GLUCOSE, CAPILLARY     Status: Normal  Collection Time   09/22/11  6:37 AM      Component Value Range Comment   Glucose-Capillary 81  70 - 99 (mg/dL)   GLUCOSE, CAPILLARY     Status: Abnormal   Collection Time   09/22/11 11:11 AM      Component Value Range Comment   Glucose-Capillary 116 (*) 70 - 99 (mg/dL)    Comment 1 Documented in Chart      Comment 2 Notify RN       Assessment and Plan [redacted]w[redacted]d  S/p rescue cerclage.  The plan per Dr Pennie Rushing is too keep the foley for one week.  Hospitalize pt until 24 weeks.   Blood sugars are maintained by Dr Lucianne Muss

## 2011-09-23 LAB — GLUCOSE, CAPILLARY
Glucose-Capillary: 79 mg/dL (ref 70–99)
Glucose-Capillary: 89 mg/dL (ref 70–99)

## 2011-09-23 NOTE — Progress Notes (Signed)
1712. Cbg= 95.

## 2011-09-23 NOTE — Progress Notes (Signed)
Patient ID: Kathryn Howard, female   DOB: April 18, 1976, 36 y.o.   MRN: [id number]  Kathryn Howard is a 36 y.o. G2P0010 at [redacted]w[redacted]d admitted for advanced cervical dilation s/p cerclage.   Subjective: No complaints  Objective: BP 108/57  Pulse 89  Temp(Src) 98.3 F (36.8 C) (Oral)  Resp 20  Ht 5\' 4"  (1.626 m)  Wt 233 lb 14.4 oz (106.096 kg)  BMI 40.15 kg/m2  SpO2 100% I/O last 3 completed shifts: In: -  Out: 1750 [Urine:1750]   CBG (last 3)   Basename 09/23/11 1715 09/23/11 1205 09/23/11 0559  GLUCAP 95 89 79      Physical Exam:  Deferred  FHT:  145 UC:   none SVE:   deferred  Labs: Lab Results  Component Value Date   WBC 12.2* 09/14/2011   HGB 11.0* 09/14/2011   HCT 33.5* 09/14/2011   MCV 80.1 09/14/2011   PLT 229 09/14/2011    Assessment and Plan: P0 at 19 4/7 wks stable s/p cerclage.  DM well controlled on metformin and insulin - followed by Dr. Lucianne Muss.  [name] 09/23/2011, 9:29 PM

## 2011-09-24 NOTE — Progress Notes (Addendum)
Patient ID: Kathryn Howard, female   DOB: 17-May-1975, 36 y.o.   MRN: [id number] Kathryn Howard is a 36 y.o. G2P0010 at [redacted]w[redacted]d admitted for advanced cervical dilation s/p rescue cerclage.   Subjective: No complaints.  Denies change in d/c or any vag bleeding or ctxs/cramping.   Objective: BP 118/59  Pulse 80  Temp(Src) 98.2 F (36.8 C) (Oral)  Resp 20  Ht 5\' 4"  (1.626 m)  Wt 233 lb 14.4 oz (106.096 kg)  BMI 40.15 kg/m2  SpO2 100% I/O last 3 completed shifts: In: -  Out: 1750 [Urine:1750] Total I/O In: -  Out: 850 [Urine:850]  CBG (last 3)   Basename 09/24/11 1303 09/24/11 0613 09/23/11 2210  GLUCAP 101* 88 94      Physical Exam:  Gen: alert and resting in trendelenburg Chest/Lungs: cta bilaterally  Heart/Pulse: RRR  Abdomen: soft, gravid, nontender Uterine fundus: soft, nontender  EXT: negative Homan's b/l, edema neg  FHT:  145 UC:   none SVE:   deferred  Labs: Lab Results  Component Value Date   WBC 12.2* 09/14/2011   HGB 11.0* 09/14/2011   HCT 33.5* 09/14/2011   MCV 80.1 09/14/2011   PLT 229 09/14/2011    Assessment and Plan: P0 at 19 5/7wks currently stable s/p rescue cerclage.  Cont bedrest.  Plan to continue foley x 2wks from cerclage placement with macrobid.  Plan to continue in hosp obs until at least 24wks.  DM well controlled on metformin and 4u Novolog with dinner.  [name] 09/24/2011, 1:34 PM

## 2011-09-25 LAB — GLUCOSE, CAPILLARY
Glucose-Capillary: 82 mg/dL (ref 70–99)
Glucose-Capillary: 95 mg/dL (ref 70–99)
Glucose-Capillary: 132 mg/dL — ABNORMAL HIGH (ref 70–99)

## 2011-09-25 NOTE — Progress Notes (Signed)
Patient ID: Kathryn Howard, female   DOB: 12-01-1975, 36 y.o.   MRN: [id number] Kathryn Howard is a 36 y.o. G2P0010 at [redacted]w[redacted]d admitted for advanced cervical dilation s/p rescue cerclage.   Subjective: No complaints.  Denies change in d/c or any vag bleeding or ctxs/cramping. Reported foley uncomfortable earlier but better after RN repositioned.  Objective: BP 116/67  Pulse 104  Temp(Src) 98.4 F (36.9 C) (Oral)  Resp 20  Ht 5\' 4"  (1.626 m)  Wt 233 lb 14.4 oz (106.096 kg)  BMI 40.15 kg/m2  SpO2 100% I/O last 3 completed shifts: In: -  Out: 2950 [Urine:2950] Total I/O In: -  Out: 1150 [Urine:1150]  CBG (last 3)   Basename 09/25/11 1705 09/25/11 1227 09/25/11 0652  GLUCAP 132* 95 82    Physical Exam:  Gen: alert and resting in trendelenburg Chest/Lungs: cta bilaterally  Heart/Pulse: RRR  Abdomen: soft, gravid, nontender Uterine fundus: soft, nontender  EXT: negative Homan's b/l, edema neg  FHT:  150 UC:   none SVE:   deferred  Labs: Lab Results  Component Value Date   WBC 12.2* 09/14/2011   HGB 11.0* 09/14/2011   HCT 33.5* 09/14/2011   MCV 80.1 09/14/2011   PLT 229 09/14/2011    Assessment and Plan: P0 at 19 6/7wks currently stable s/p rescue cerclage.  Cont bedrest.  Plan to continue foley x 2wks (plan to d/c 5/31)  from cerclage placement.  On macrobid for prophylaxis.  Plan to continue in hosp obs until at least 24wks.  DM well controlled on metformin and 4u Novolog with dinner.  Kathryn Howard 09/25/2011, 6:51 PM

## 2011-09-25 NOTE — Progress Notes (Signed)
Ur chart review completed.  

## 2011-09-26 LAB — GLUCOSE, CAPILLARY
Glucose-Capillary: 87 mg/dL (ref 70–99)
Glucose-Capillary: 90 mg/dL (ref 70–99)
Glucose-Capillary: 89 mg/dL (ref 70–99)

## 2011-09-26 NOTE — Progress Notes (Signed)
Patient ID: Kathryn Howard, female   DOB: 11-10-1975, 36 y.o.   MRN: [id number] Pt without complaints.  No leakage of fluid or VB.  Good FM Pt desires for her foley to be removed.  It is really bothering her and feels like it is pinching.  She states it interferes with her sleep  BP 108/85  Pulse 83  Temp(Src) 98.7 F (37.1 C) (Oral)  Resp 20  Ht 5\' 4"  (1.626 m)  Wt 233 lb 14.4 oz (106.096 kg)  BMI 40.15 kg/m2  SpO2 100%  FHTS Baseline: 145 bpm  Toco none  Pt in NAD CV RRR Lungs CTAB abd  Gravid soft and NT GU no vb EXt no calf tenderness Results for orders placed during the hospital encounter of 09/14/11 (from the past 72 hour(s))  GLUCOSE, CAPILLARY     Status: Normal   Collection Time   09/23/11  5:15 PM      Component Value Range Comment   Glucose-Capillary 95  70 - 99 (mg/dL)   GLUCOSE, CAPILLARY     Status: Normal   Collection Time   09/23/11 10:10 PM      Component Value Range Comment   Glucose-Capillary 94  70 - 99 (mg/dL)   GLUCOSE, CAPILLARY     Status: Normal   Collection Time   09/24/11  6:13 AM      Component Value Range Comment   Glucose-Capillary 88  70 - 99 (mg/dL)   GLUCOSE, CAPILLARY     Status: Abnormal   Collection Time   09/24/11  1:03 PM      Component Value Range Comment   Glucose-Capillary 101 (*) 70 - 99 (mg/dL)   GLUCOSE, CAPILLARY     Status: Normal   Collection Time   09/24/11  4:47 PM      Component Value Range Comment   Glucose-Capillary 89  70 - 99 (mg/dL)    Comment 1 Documented in Chart     GLUCOSE, CAPILLARY     Status: Normal   Collection Time   09/24/11  9:40 PM      Component Value Range Comment   Glucose-Capillary 87  70 - 99 (mg/dL)   GLUCOSE, CAPILLARY     Status: Normal   Collection Time   09/25/11  6:52 AM      Component Value Range Comment   Glucose-Capillary 82  70 - 99 (mg/dL)   GLUCOSE, CAPILLARY     Status: Normal   Collection Time   09/25/11 12:27 PM      Component Value Range Comment   Glucose-Capillary 95  70 - 99  (mg/dL)    Comment 1 Documented in Chart      Comment 2 Notify RN     GLUCOSE, CAPILLARY     Status: Abnormal   Collection Time   09/25/11  5:05 PM      Component Value Range Comment   Glucose-Capillary 132 (*) 70 - 99 (mg/dL)    Comment 1 Documented in Chart      Comment 2 Notify RN     GLUCOSE, CAPILLARY     Status: Normal   Collection Time   09/25/11 10:31 PM      Component Value Range Comment   Glucose-Capillary 83  70 - 99 (mg/dL)   GLUCOSE, CAPILLARY     Status: Normal   Collection Time   09/26/11  6:12 AM      Component Value Range Comment   Glucose-Capillary 87  70 - 99 (mg/dL)  Assessment and Plan [redacted]w[redacted]d  Incompetent cervix  S/p emergency cerclage Pt stable  D/c foley per pts request and use bedpan

## 2011-09-26 NOTE — Progress Notes (Signed)
PT Cancellation Note  Treatment cancelled today due to patient extremely uncomfortable due to catheter.  Will attempt PT evaluation tomorrow as patient tolerates.  Olivia Canter, [place] [phone] 09/26/2011, 10:29 AM

## 2011-09-26 NOTE — Progress Notes (Signed)
MD left msg that pt c/o pain at the foley catheter, pt stating that she "feels it inside her body" Pt wants to talk to MD about possibly taking the catheter out.

## 2011-09-27 LAB — GLUCOSE, CAPILLARY: Glucose-Capillary: 108 mg/dL — ABNORMAL HIGH (ref 70–99)

## 2011-09-27 LAB — URINE CULTURE: Culture  Setup Time: 201305282114

## 2011-09-27 NOTE — Progress Notes (Signed)
No complaints denies srom, vag bleeding, uc, with +FM, BM today. O VSS      Calm, no distress, HEENT WNL,  lungs clear bilaterally, AP RRR, fundus at U, abd soft nt,no masses, not tympanic bowel sounds active, abdomen nontender, No edema to lower extremities, SCDs on. CBG (last 3)   Basename 09/27/11 1208 09/27/11 0735 09/26/11 2230  GLUCAP 108* 89 89    A 20 1/7 week IUP     Cerclage P discussed deep breathing q 2hours, continue care. Lavera Guise, CNM

## 2011-09-28 LAB — GLUCOSE, CAPILLARY
Glucose-Capillary: 95 mg/dL (ref 70–99)
Glucose-Capillary: 103 mg/dL — ABNORMAL HIGH (ref 70–99)
Glucose-Capillary: 110 mg/dL — ABNORMAL HIGH (ref 70–99)

## 2011-09-28 NOTE — Evaluation (Signed)
Physical Therapy Evaluation Patient Details Name: Kathryn Howard MRN: [id number] DOB: Sep 14, 1975 Today's Date: 09/28/2011 Time: 1350-1403 PT Time Calculation (min): 13 min  PT Assessment / Plan / Recommendation Clinical Impression  Patient currently at [redacted]w[redacted]d gestation on strict bedrest and in trendelenberg s/p rescue cerclage.  patient demonstrated understanding of all exercises and positioning.  Will monitor remotely and physically check in with patient as needed.  Patient may need PT services once more mobility allowed depending on how long patient is on bedreste and the amount of decondintioning she experiences.    PT Assessment  Patient needs continued PT services    Follow Up Recommendations  No PT follow up             Frequency Other (Comment) (monitor remotely, assess/treat as needed)    Precautions / Restrictions Precautions Precautions: Other (comment) Precaution Comments: bedrest; trendelenberg   Pertinent Vitals/Pain n/a      Mobility  Bed Mobility Bed Mobility: Rolling Left Rolling Left: 6: Modified independent (Device/Increase time);With rail Details for Bed Mobility Assistance: instructed patient in proper positioning in bed (pillow between knees when sidelying) and various positions (sidelying) and encouraged patient to change positions every 2 hours.  Also encouraged patient to use pillows and towels to increase comfort (filling in "gaps" when sidelying, etc)     Exercises Antenatal Exercises Ankle Circles/Pumps: AROM;Both;10 reps;Supine Quad Sets: AROM;Both;10 reps;Supine Short Arc Quad: AROM;Both;10 reps;Supine   PT Problem List: Decreased activity tolerance (strict bedrest; )   Visit Information  Last PT Received On: 09/28/11 Assistance Needed: +1    Subjective Data  Subjective: Patient reports she has been using the k-pad for back discomfort; back feels better now Patient Stated Goal: deliver a healthy baby   Prior Functioning  Home Living Lives  With: Other (Comment) (n/a - patient to be in hospital until at least 24 weeks gest) Prior Function Level of Independence: Independent    Cognition  Overall Cognitive Status: Appears within functional limits for tasks assessed/performed Arousal/Alertness: Awake/alert Orientation Level: Appears intact for tasks assessed Behavior During Session: [facility] for tasks performed    Extremity/Trunk Assessment Right Upper Extremity Assessment RUE ROM/Strength/Tone: Within functional levels Left Upper Extremity Assessment LUE ROM/Strength/Tone: Within functional levels Right Lower Extremity Assessment RLE ROM/Strength/Tone: Within functional levels Left Lower Extremity Assessment LLE ROM/Strength/Tone: Within functional levels   Balance    End of Session PT - End of Session Activity Tolerance: Patient tolerated treatment well Patient left: in bed   Olivia Canter, [place] [phone] 09/28/2011, 2:12 PM

## 2011-09-28 NOTE — Progress Notes (Signed)
UR Chart review completed.  

## 2011-09-28 NOTE — Progress Notes (Signed)
Hospital day # 14 pregnancy at [redacted]w[redacted]d--s/p rescue cerclage 5/17  S:  Doing well, reports good fetal activity      Perception of contractions: none      Vaginal bleeding: None       Vaginal discharge:  None      Had questions regarding CP cyst on anatomy scan.       O: BP 110/60  Pulse 80  Temp(Src) 98 F (36.7 C) (Oral)  Resp 20  Ht 5\' 4"  (1.626 m)  Wt 236 lb 12.8 oz (107.412 kg)  BMI 40.65 kg/m2  SpO2 100%      Fetal tracings: 147 per Doppler      Contractions:   None      Uterus gravid and non-tender      Extremities: no significant edema and no signs of DVT          Labs:   Results for orders placed during the hospital encounter of 09/14/11 (from the past 24 hour(s))  GLUCOSE, CAPILLARY     Status: Abnormal   Collection Time   09/27/11 12:08 PM      Component Value Range   Glucose-Capillary 108 (*) 70 - 99 (mg/dL)   Comment 1 Notify RN     Comment 2 Documented in Chart    GLUCOSE, CAPILLARY     Status: Normal   Collection Time   09/27/11  4:42 PM      Component Value Range   Glucose-Capillary 88  70 - 99 (mg/dL)   Comment 1 Notify RN     Comment 2 Documented in Chart    GLUCOSE, CAPILLARY     Status: Normal   Collection Time   09/27/11 10:08 PM      Component Value Range   Glucose-Capillary 93  70 - 99 (mg/dL)  GLUCOSE, CAPILLARY     Status: Normal   Collection Time   09/28/11  6:37 AM      Component Value Range   Glucose-Capillary 95  70 - 99 (mg/dL)          Meds: Glucophage 2000 mg with supper                 Insulin 4 u Novalog before supper  A: [redacted]w[redacted]d with incompetent cervix, s/p cerclage 5/17     Stable     Type 2 diabetic  P: Continue current plan of care      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  Advised patient would anticipate f/u US 4 weeks from initial scan      (week of June 10th) for follow-up specifically on CP cyst.      Upcoming tests/treatments:  17p every Saturday      MDs will follow  Nigel Bridgeman CNM, [place] 09/28/2011 10:07  AM

## 2011-09-29 LAB — GLUCOSE, CAPILLARY
Glucose-Capillary: 124 mg/dL — ABNORMAL HIGH (ref 70–99)
Glucose-Capillary: 105 mg/dL — ABNORMAL HIGH (ref 70–99)

## 2011-09-29 NOTE — Progress Notes (Signed)
Hospital day # 15 pregnancy at [redacted]w[redacted]d--s/p rescue cerclage on 09/15/11  S:  Doing well, reports good fetal activity      Perception of contractions: none      Vaginal bleeding: None       Vaginal discharge:  None  O: BP 118/58  Pulse 75  Temp(Src) 97.9 F (36.6 C) (Oral)  Resp 20  Ht 5\' 4"  (1.626 m)  Wt 236 lb 12.8 oz (107.412 kg)  BMI 40.65 kg/m2  SpO2 100%      Fetal tracings:  FHR 150s per doppler      Contractions:   None per patient      Uterus non-tender      Extremities: no significant edema and no signs of DVT          Labs:   Results for orders placed during the hospital encounter of 09/14/11 (from the past 24 hour(s))  GLUCOSE, CAPILLARY     Status: Abnormal   Collection Time   09/28/11 12:08 PM      Component Value Range   Glucose-Capillary 103 (*) 70 - 99 (mg/dL)   Comment 1 Notify RN     Comment 2 Documented in Chart    GLUCOSE, CAPILLARY     Status: Abnormal   Collection Time   09/28/11  5:40 PM      Component Value Range   Glucose-Capillary 106 (*) 70 - 99 (mg/dL)  GLUCOSE, CAPILLARY     Status: Abnormal   Collection Time   09/28/11 10:14 PM      Component Value Range   Glucose-Capillary 110 (*) 70 - 99 (mg/dL)  GLUCOSE, CAPILLARY     Status: Normal   Collection Time   09/29/11  6:20 AM      Component Value Range   Glucose-Capillary 91  70 - 99 (mg/dL)          Meds: Glyburide 2000 mg q supper                 Novalog 4 u at dinner  A: [redacted]w[redacted]d with incompetent cervix, s/p rescue cerclage 5/17     Type 2 diabetes     Stable  P: Continue current plan of care      Upcoming tests/treatments:  17P on Saturday      MDs will follow        Nigel Bridgeman CNM, [place] 09/29/2011 10:08 AM

## 2011-09-30 ENCOUNTER — Encounter: Payer: Self-pay | Admitting: Obstetrics and Gynecology

## 2011-09-30 DIAGNOSIS — Z9289 Personal history of other medical treatment: Secondary | ICD-10-CM

## 2011-09-30 HISTORY — DX: Personal history of other medical treatment: Z92.89

## 2011-09-30 LAB — GLUCOSE, CAPILLARY
Glucose-Capillary: 104 mg/dL — ABNORMAL HIGH (ref 70–99)
Glucose-Capillary: 93 mg/dL (ref 70–99)

## 2011-09-30 NOTE — Progress Notes (Addendum)
Kathryn Howard is a 36 y.o. G2P0010 at [redacted]w[redacted]d admitted for incomp cx. Cerclage placed on 09/15/11.  Subjective:  Doing well. Normal BM's. No contractions or bleeding.  Objective: BP 103/66  Pulse 85  Temp(Src) 98 F (36.7 C) (Oral)  Resp 20  Ht 5\' 4"  (1.626 m)  Wt 107.412 kg (236 lb 12.8 oz)  BMI 40.65 kg/m2  SpO2 100%     FHT:   145 UC:     none SVE:   Deferred  FBS:   93  Abd: Nontender  Labs: Lab Results  Component Value Date   WBC 12.2* 09/14/2011   HGB 11.0* 09/14/2011   HCT 33.5* 09/14/2011   MCV 80.1 09/14/2011   PLT 229 09/14/2011    Assessment / Plan: DOing well. Stable diabetes.  Labor: none Preeclampsia:  no signs or symptoms of toxicity Fetal Wellbeing:  stable Pain Control:  no pain I/D:  n/a Anticipated MOD:  prolonged bedrest 17 P today (Lot #: [id number] ; [facility]; NDC#: Unknown; Exp. Date: 10/23/2011).   Malachi Kinzler V 09/30/2011, 8:36 AM

## 2011-10-01 LAB — GLUCOSE, CAPILLARY

## 2011-10-01 NOTE — Progress Notes (Signed)
Rescue Cerclage on 09/15/11 Incompetent Cervix  S: Doing well. Enjoyed going outside yesterday.  O: BP 109/65  Pulse 73  Temp(Src) 98.1 F (36.7 C) (Oral)  Resp 20  Ht 5\' 4"  (1.626 m)  Wt 107.412 kg (236 lb 12.8 oz)  BMI 40.65 kg/m2  SpO2 100%  FBS: 88 FHT: 145  ABD: Nontender  A: 20 weeks and 5 days gestation     Incompetent Cervix     Day 17 S/P Rescue Cerclage     Stable diabetes  P: Continue current care.  Leonard Schwartz

## 2011-10-01 NOTE — Progress Notes (Signed)
Hospital day # 17 pregnancy at [redacted]w[redacted]d--s/p rescue cerclage 09/15/11  S:  Doing well, reports good fetal activity.  Took stretcher ride outside yesterday.  In good spirits      Perception of contractions: none      Vaginal bleeding: None       Vaginal discharge:  None  O: BP 109/65  Pulse 73  Temp(Src) 98.1 F (36.7 C) (Oral)  Resp 20  Ht 5\' 4"  (1.626 m)  Wt 236 lb 12.8 oz (107.412 kg)  BMI 40.65 kg/m2  SpO2 100%      Fetal tracings: 150s per doppler      Contractions:   None      Uterus non-tender      Extremities: no significant edema and no signs of DVT          CBG (last 3)   Basename 10/01/11 0612 09/30/11 2214 09/30/11 1705  GLUCAP 88 93 104*   Received 17P yesterday.         Meds: Glyburide 2000 mg q supper                 Novalog 4 units at supper  A: [redacted]w[redacted]d with rescue cerclage--stable     Stable     Type 2 diabetes  P: Continue current plan of care      MDs will follow  Nigel Bridgeman CNM, [place] 10/01/2011 10:19 AM

## 2011-10-02 LAB — GLUCOSE, CAPILLARY
Glucose-Capillary: 89 mg/dL (ref 70–99)
Glucose-Capillary: 84 mg/dL (ref 70–99)
Glucose-Capillary: 90 mg/dL (ref 70–99)

## 2011-10-02 NOTE — Progress Notes (Signed)
Hospital day # 18 pregnancy at [redacted]w[redacted]d  S: well, reports good fetal activity      Contractions:none      Vaginal bleeding:none now       Vaginal discharge: no significant change  O: BP 94/41  Pulse 75  Temp(Src) 98.2 F (36.8 C) (Oral)  Resp 20  Ht 5\' 4"  (1.626 m)  Wt 236 lb 12.8 oz (107.412 kg)  BMI 40.65 kg/m2  SpO2 100%           Uterus non-tender      Extremities: no significant edema and no signs of DVT  A: [redacted]w[redacted]d with cervical incompetence and Type 2 Diabetes ( very well controlled - Dr Lucianne Muss)     stable  P: continue current plan of care      D/C Macrobid  Almarie Kurdziel A  MD 10/02/2011 1:42 PM

## 2011-10-02 NOTE — Progress Notes (Signed)
This was a follow-up visit with Kathryn Howard.  She was in good spirits.  We spoke about how she is trying to make her time here meaningful and about how she is seeing her priorities differently given her history of loss and the difficult nature of this pregnancy.  I offered compassionate listening and affirmation.  [name], [phone] 1:31 PM   10/02/11 1300  Clinical Encounter Type  Visited With Patient  Visit Type Follow-up  Spiritual Encounters  Spiritual Needs Emotional

## 2011-10-02 NOTE — Progress Notes (Signed)
Ur chart review completed.  

## 2011-10-03 LAB — GLUCOSE, CAPILLARY

## 2011-10-03 NOTE — Progress Notes (Signed)
10/03/11 1205  Clinical Encounter Type  Visit Type Initial;Follow-up;Spiritual support;Social support  Spiritual Encounters  Spiritual Needs Emotional    Made my first visit, >1 hour, with Tangela per referral from [employer].  Kathryn Howard was in good spirits and shared about her gratitude for medical care and her faith that this baby boy will fare well despite obstacles.  Provided opportunity for [name] to reflect in detail on her early childhood in the [place] and later childhood/adolescence in the [place], her parents' home country.  Offered pastoral listening, reflection, and affirmation as she explored family and cultural values across continents, and what her multicultural, multilingual growing-up experience has brought to her life as a person and as a mother-in-process.  She was so appreciative of opportunity for meaningful visit and opportunity to share and think aloud, particularly as she reflects on family conflict and begins to shape her parenting values and hopes.  Spiritual Care will continue to follow for support and encouragement.  [address] [place], [place] [phone]

## 2011-10-03 NOTE — Progress Notes (Addendum)
Patient ID: [name], female   DOB: Oct 14, 1975, 36 y.o.   MRN: [id number] Kathryn Howard is a 36 y.o. G2P0010 at [redacted]w[redacted]d admitted for advanced cervical dilation s/p cerclage.   Subjective: Pt on the phone upon entering room in very engaged conversation.  She said there are no changes.  Objective: BP 103/54  Pulse 81  Temp(Src) 98.7 F (37.1 C) (Oral)  Resp 20  Ht 5\' 4"  (1.626 m)  Wt 236 lb 12.8 oz (107.412 kg)  BMI 40.65 kg/m2  SpO2 100%     CBG (last 3)   Basename 10/03/11 1732 10/03/11 1210 10/03/11 0610  GLUCAP 124* 122* 85      Physical Exam:  Gen: alert Uterine fundus: soft, nontender  FHT:  146 UC:   none SVE:   Dilation: 3.5 Exam by:: HILIARY, CNM    Labs: Lab Results  Component Value Date   WBC 12.2* 09/14/2011   HGB 11.0* 09/14/2011   HCT 33.5* 09/14/2011   MCV 80.1 09/14/2011   PLT 229 09/14/2011    Assessment and Plan: P0 at 21wks currently stable.  Continue current plan.  BMZ at 24wks.  DM in good control.  Kathryn Howard 10/03/2011, 3:28 PM

## 2011-10-04 LAB — GLUCOSE, CAPILLARY
Glucose-Capillary: 99 mg/dL (ref 70–99)
Glucose-Capillary: 84 mg/dL (ref 70–99)

## 2011-10-04 NOTE — Progress Notes (Signed)
19 Days Post-Op Procedure(s) (LRB): CERCLAGE CERVICAL (N/A)  The patient is currently at 21 weeks and 1 day gestation.  Subjective: Patient reports tolerating PO and no problems voiding.    Objective: I have reviewed patient's vital signs and labs. Fasting blood sugar equals 90.  GI: soft, non-tender; bowel sounds normal; no masses,  no organomegaly and Nontender gravid abdomen  Assessment: s/p Procedure(s): CERCLAGE CERVICAL: stable Diabetes: Stable  Plan: Continue in-hospital management  LOS: 20 days    Kathryn Howard V 10/04/2011, 10:53 AM

## 2011-10-05 LAB — GLUCOSE, CAPILLARY
Glucose-Capillary: 86 mg/dL (ref 70–99)
Glucose-Capillary: 97 mg/dL (ref 70–99)

## 2011-10-05 NOTE — Progress Notes (Signed)
Patient ID: Kathryn Howard, female   DOB: Sep 16, 1975, 36 y.o.   MRN: [id number] Pt without complaints.  No leakage of fluid or VB.  Good FM  BP 109/51  Pulse 93  Temp(Src) 98.5 F (36.9 C) (Oral)  Resp 18  Ht 5\' 4"  (1.626 m)  Wt 227 lb (102.967 kg)  BMI 38.96 kg/m2  SpO2 100%  FHTS Baseline: 145 bpm  Toco none  Pt in NAD CV RRR Lungs CTAB abd  Gravid soft and NT GU no vb EXt no calf tenderness Results for orders placed during the hospital encounter of 09/14/11 (from the past 72 hour(s))  GLUCOSE, CAPILLARY     Status: Normal   Collection Time   10/02/11  9:55 PM      Component Value Range Comment   Glucose-Capillary 90  70 - 99 (mg/dL)   GLUCOSE, CAPILLARY     Status: Normal   Collection Time   10/03/11  6:10 AM      Component Value Range Comment   Glucose-Capillary 85  70 - 99 (mg/dL)   GLUCOSE, CAPILLARY     Status: Abnormal   Collection Time   10/03/11 12:10 PM      Component Value Range Comment   Glucose-Capillary 122 (*) 70 - 99 (mg/dL)    Comment 1 Notify RN      Comment 2 Documented in Chart     GLUCOSE, CAPILLARY     Status: Abnormal   Collection Time   10/03/11  5:32 PM      Component Value Range Comment   Glucose-Capillary 124 (*) 70 - 99 (mg/dL)    Comment 1 Notify RN      Comment 2 Documented in Chart     GLUCOSE, CAPILLARY     Status: Abnormal   Collection Time   10/03/11 10:29 PM      Component Value Range Comment   Glucose-Capillary 116 (*) 70 - 99 (mg/dL)   GLUCOSE, CAPILLARY     Status: Normal   Collection Time   10/04/11  6:26 AM      Component Value Range Comment   Glucose-Capillary 90  70 - 99 (mg/dL)   GLUCOSE, CAPILLARY     Status: Abnormal   Collection Time   10/04/11 12:02 PM      Component Value Range Comment   Glucose-Capillary 170 (*) 70 - 99 (mg/dL)    Comment 1 Documented in Chart      Comment 2 Notify RN     GLUCOSE, CAPILLARY     Status: Normal   Collection Time   10/04/11  4:00 PM      Component Value Range Comment   Glucose-Capillary 99   70 - 99 (mg/dL)    Comment 1 Notify RN      Comment 2 Documented in Chart     GLUCOSE, CAPILLARY     Status: Normal   Collection Time   10/04/11  9:39 PM      Component Value Range Comment   Glucose-Capillary 84  70 - 99 (mg/dL)   GLUCOSE, CAPILLARY     Status: Normal   Collection Time   10/05/11  6:32 AM      Component Value Range Comment   Glucose-Capillary 86  70 - 99 (mg/dL)   GLUCOSE, CAPILLARY     Status: Normal   Collection Time   10/05/11 11:54 AM      Component Value Range Comment   Glucose-Capillary 97  70 - 99 (mg/dL)  Comment 1 Documented in Chart      Comment 2 Notify RN     GLUCOSE, CAPILLARY     Status: Abnormal   Collection Time   10/05/11  4:14 PM      Component Value Range Comment   Glucose-Capillary 102 (*) 70 - 99 (mg/dL)     Assessment and Plan [redacted]w[redacted]d  Pt with GDM and Incompetent cervix s/p emergency cerclage Pt is stable Monitor closely This is a late entry.  Pt seen at 1:00pm

## 2011-10-06 LAB — GLUCOSE, CAPILLARY
Glucose-Capillary: 150 mg/dL — ABNORMAL HIGH (ref 70–99)
Glucose-Capillary: 96 mg/dL (ref 70–99)

## 2011-10-06 NOTE — Progress Notes (Signed)
Hospital day # 22 pregnancy at [redacted]w[redacted]d  S: well, reports good fetal activity      Contractions:none      Vaginal bleeding:none now       Vaginal discharge: no significant change  O: BP 101/44  Pulse 91  Temp(Src) 98.1 F (36.7 C) (Oral)  Resp 18  Ht 5\' 4"  (1.626 m)  Wt 227 lb (102.967 kg)  BMI 38.96 kg/m2  SpO2 100%       Uterus gravid and non-tender      Extremities: no significant edema and no signs of DVT  A: [redacted]w[redacted]d with cervical incompetence stable     Type 2 diabetes well controlled       P: continue current plan of care  Tamatha Gadbois A  MD 10/06/2011 10:49 AM

## 2011-10-06 NOTE — Progress Notes (Signed)
UR Chart review completed.  

## 2011-10-07 ENCOUNTER — Inpatient Hospital Stay (HOSPITAL_COMMUNITY): Payer: Managed Care, Other (non HMO)

## 2011-10-07 LAB — GLUCOSE, CAPILLARY
Glucose-Capillary: 79 mg/dL (ref 70–99)
Glucose-Capillary: 112 mg/dL — ABNORMAL HIGH (ref 70–99)
Glucose-Capillary: 90 mg/dL (ref 70–99)

## 2011-10-07 NOTE — Progress Notes (Signed)
cbg 2 hour PP=90.

## 2011-10-07 NOTE — Progress Notes (Signed)
Hospital day # 23 pregnancy at [redacted]w[redacted]d  S: well, reports good fetal activity      Contractions:none      Vaginal bleeding:none now       Vaginal discharge: no significant change  O: BP 94/34  Pulse 78  Temp(Src) 98 F (36.7 C) (Oral)  Resp 20  Ht 5\' 4"  (1.626 m)  Wt 227 lb (102.967 kg)  BMI 38.96 kg/m2  SpO2 100%      Fetal tracings:FHR reassuring      Uterus gravid and non-tender      Extremities: no significant edema and no signs of DVT  A: [redacted]w[redacted]d with cervical incompetence     stable  P: continue current plan of care  Nakina Spatz A  MD 10/07/2011 10:37 AM

## 2011-10-07 NOTE — Progress Notes (Signed)
Pt concerned about a small amt of vaginal discharge with brown in it. Denies cramping or feeling pressure. Orders received for bedside u/s for cerviacal length. toco applied.

## 2011-10-08 LAB — GLUCOSE, CAPILLARY: Glucose-Capillary: 81 mg/dL (ref 70–99)

## 2011-10-08 NOTE — Progress Notes (Signed)
Pt sleeping. V/s and FBS deferred.

## 2011-10-08 NOTE — Progress Notes (Signed)
Patient ID: [name], female   DOB: 1976-04-14, 36 y.o.   MRN: [id number]  Hospital day # 24 pregnancy at [redacted]w[redacted]d  S: well, reports good fetal activity      Contractions:none, denies      Vaginal bleeding:none now       Vaginal discharge: had brownish d/c yesterday about 3pm and last night at 3am, noticed when wiping.   O: BP 102/45  Pulse 75  Temp(Src) 98.6 F (37 C) (Oral)  Resp 20  Ht 5\' 4"  (1.626 m)  Wt 227 lb (102.967 kg)  BMI 38.96 kg/m2  SpO2 100%      Fetal tracings:FHR 147 per doppler      Uterus gravid and non-tender      Extremities: no significant edema and no signs of DVT US on 6-8 for cervical length =.69cm, cerclage visualized, also noted significant dilatation.  Fluid normal  A: [redacted]w[redacted]d with incompetent cervix, cerclage in place     stable  P: continue current plan of care  Kellianne Ek M  10/08/2011 1:01 PM

## 2011-10-09 ENCOUNTER — Inpatient Hospital Stay (HOSPITAL_COMMUNITY): Payer: Managed Care, Other (non HMO)

## 2011-10-09 LAB — GLUCOSE, CAPILLARY
Glucose-Capillary: 102 mg/dL — ABNORMAL HIGH (ref 70–99)
Glucose-Capillary: 108 mg/dL — ABNORMAL HIGH (ref 70–99)
Glucose-Capillary: 118 mg/dL — ABNORMAL HIGH (ref 70–99)

## 2011-10-09 NOTE — Progress Notes (Signed)
Ur chart review completed.  

## 2011-10-09 NOTE — Progress Notes (Signed)
Hospital day # 25 pregnancy at [redacted]w[redacted]d--S/P cervical cerclage 09/15/11  S:  More emotional today, struggling with issues of long-term hospitalization and anxiety regarding status.      No bleeding overnight.        Perception of contractions: None      Vaginal bleeding: None now       Vaginal discharge:  None  O: BP 105/52  Pulse 95  Temp(Src) 98.4 F (36.9 C) (Oral)  Resp 18  Ht 5\' 4"  (1.626 m)  Wt 227 lb (102.967 kg)  BMI 38.96 kg/m2  SpO2 100%      Fetal tracings:  FHR 148 by doppler      Contractions:   None per patient      Uterus non-tender      Extremities: no significant edema and no signs of DVT          Labs:  CBG (last 3)   Basename 10/09/11 0651 10/08/11 2218 10/08/11 1541  GLUCAP 82 102* 113*           Meds: 17P every Saturday (patient just renewed her prescription)                 Glyburide 2000 mg po with supper                  Novalog 4 units at supper  A: [redacted]w[redacted]d with rescue cerclage on 09/15/11     Stable  P: Continue current plan of care      Upcoming tests/treatments:  Needs fetal echo.  Plan f/u anatomy scan at that time.      Discussion with patient regarding concerns, issues, and emotions--will continue chaplaincy support.      MDs will follow  Nigel Bridgeman CNM, [place] 10/09/2011 10:00 AM

## 2011-10-10 LAB — GLUCOSE, CAPILLARY: Glucose-Capillary: 123 mg/dL — ABNORMAL HIGH (ref 70–99)

## 2011-10-10 NOTE — Progress Notes (Signed)
Hospital day # 26 pregnancy at [redacted]w[redacted]d; s/p rescue cerclage 09/15/11  S: sleeping on rt side on my arrival to her room; awakened easily. reports good fetal activity.  No urinary frequency since Sat.  No abdominal pain.        Contractions:does not discern      Vaginal bleeding:none now        Vaginal discharge: yellow and no significant change  Meds: 17P every Saturday (patient just renewed her prescription)  Glyburide 2000 mg po with supper  Novalog 4 units at supper  O: BP 112/60  Pulse 78  Temp(Src) 98.4 F (36.9 C) (Oral)  Resp 18  Ht 5\' 4"  (1.626 m)  Wt 227 lb (102.967 kg)  BMI 38.96 kg/m2  SpO2 100% .Marland Kitchen CBG (last 3)   Basename 10/10/11 0649 10/09/11 2122 10/09/11 1625  GLUCAP 86 118* 108*     Fetal tracings:Doppler=145 reviewed and reassuring      Uterus gravid and non-tender      Extremities: extremities normal, atraumatic, no cyanosis or edema and Homans sign is negative, no sign of DVT F/u u/s 10/09/11:  S=D (AUA=22w); EFW=1lb 1oz(51%); variable presentation.  All anatomy not previously seen, observed yesterday except LVOT & fetal choroid plexus.  Cx=1.7cm, funneled appearance, but closed.  FHR=136.  A: [redacted]w[redacted]d with incompetent cx and Type I DM     stable  P: continue current plan of care     MD to follow Upcoming tests/treatments: Needs fetal echo. Plan f/u anatomy scan at that time.  Kathryn Breach  MD 10/10/2011 10:07 AM

## 2011-10-11 LAB — GLUCOSE, CAPILLARY
Glucose-Capillary: 84 mg/dL (ref 70–99)
Glucose-Capillary: 141 mg/dL — ABNORMAL HIGH (ref 70–99)

## 2011-10-11 NOTE — Progress Notes (Signed)
C/o of mucus discharge sm amount clear prior to using the bedpan. Discussed need to notify staff if watery discharge dripping or under pad is wet or vaginal irritation or vaginal bleeding. Discussed with Dr. Pennie Rushing at [facility]. Lavera Guise, CNM

## 2011-10-11 NOTE — Progress Notes (Addendum)
Kathryn Howard is a 36 y.o. G2P0010 at [redacted]w[redacted]d Reason for Admission:21615}  Subjective: comfortable denies needs, sm amount yellow discharge at times with wiping only no vaginal irritation denies srom, vag bleeding, uc, with +FM  Pregnancy complications: cerclage, type 2 diabetic  Objective: BP 105/73  Pulse 97  Temp 98.1 F (36.7 C) (Oral)  Resp 18  Ht 5\' 4"  (1.626 m)  Wt 227 lb (102.967 kg)  BMI 38.96 kg/m2  SpO2 100% I/O last 3 completed shifts: In: -  Out: 1 [Stool:1]    Physical Exam:  Gen: calm, pleasant, no distress Chest/Lungs: cta bilaterally  Heart/Pulse: RRR  Abdomen: soft, gravid, nontender, BX x4 quad Uterine fundus: soft, nontender, 22 week size Skin & Color: warm and dry  EXT: negative Homan's b/l, edema none, with SCDs on  SVE:   Dilation: 3.5 Exam by:: HILIARY, CNM    Labs: Lab Results  Component Value Date   WBC 12.2* 09/14/2011   HGB 11.0* 09/14/2011   HCT 33.5* 09/14/2011   MCV 80.1 09/14/2011   PLT 229 09/14/2011    Assessment and Plan: 22 1/7 week IUP post cerclage has Type 2 diabetes mellitus; Increased BMI; Hx of infertility; Female circumcision; History of PCOS; H/O miscarriage, currently pregnant; and Incompetent cervix in pregnancy, antepartum on her problem list. Continue care. Lavera Guise, CNM 10/11/2011, 9:31 AM      Will plan fetal cardiac echo next week

## 2011-10-12 ENCOUNTER — Telehealth: Payer: Self-pay

## 2011-10-12 DIAGNOSIS — O3430 Maternal care for cervical incompetence, unspecified trimester: Secondary | ICD-10-CM

## 2011-10-12 LAB — GLUCOSE, CAPILLARY
Glucose-Capillary: 91 mg/dL (ref 70–99)
Glucose-Capillary: 85 mg/dL (ref 70–99)

## 2011-10-12 NOTE — Telephone Encounter (Signed)
Spoke with Dr. Elizebeth Brooking rgdg pt's fetal echo which needs to be sched next week per vph. Aware pt is admitted at the [facility]. Per Dr. Elizebeth Brooking, will perform fetal echo at the beginning of next week. Vph aware.

## 2011-10-12 NOTE — Progress Notes (Signed)
UR chart review completed.  

## 2011-10-12 NOTE — Progress Notes (Signed)
Hospital day # 28 pregnancy at [redacted]w[redacted]d  S: well, reports good fetal activity      Contractions:none      Vaginal bleeding:none now       Vaginal discharge: no significant change  O: BP 114/72  Pulse 86  Temp 98.1 F (36.7 C) (Oral)  Resp 18  Ht 5\' 4"  (1.626 m)  Wt 227 lb 12.8 oz (103.329 kg)  BMI 39.10 kg/m2  SpO2 100%      FHR: reassuring      Uterus gravid and non-tender      Extremities: no significant edema and no signs of DVT  A: [redacted]w[redacted]d with cervical incompetence and Type 2 DB     stable  P: continue current plan of care  Niya Behler A  MD 10/12/2011 9:56 AM

## 2011-10-13 DIAGNOSIS — O24919 Unspecified diabetes mellitus in pregnancy, unspecified trimester: Secondary | ICD-10-CM

## 2011-10-13 LAB — GLUCOSE, CAPILLARY: Glucose-Capillary: 115 mg/dL — ABNORMAL HIGH (ref 70–99)

## 2011-10-13 NOTE — Progress Notes (Signed)
Hospital day # 29 pregnancy at [redacted]w[redacted]d--s/p rescue cerclage 09/15/11, Type 2 diabetes  S:  Doing well, reports good fetal activity.       Perception of contractions: none      Vaginal bleeding: None       Vaginal discharge:  None  O: BP 117/55  Pulse 91  Temp 97.4 F (36.3 C) (Oral)  Resp 20  Ht 5\' 4"  (1.626 m)  Wt 227 lb 12.8 oz (103.329 kg)  BMI 39.10 kg/m2  SpO2 100%      Fetal tracings: FHR 150s per doppler      Contractions:   None      Uterus non-tender      Extremities: no significant edema and no signs of DVT          Labs: CBG (last 3)   Basename 10/13/11 0647 10/12/11 2220 10/12/11 1541  GLUCAP 85 85 115*            Meds: Glyburide 2000 mg at supper                 Novalog 4 u at supper  A: [redacted]w[redacted]d with incompetent cervix, s/p rescue cerclage 5/17     Type 2 diabetes     Stable  P: Continue current plan of care      Upcoming tests/treatments:  Fetal echo next week                               17P on Saturday (current vial in pharmacy)      Anticipate Betamethasone course at 24 weeks--will consult regarding specific dates.       MDs will follow  Nigel Bridgeman CNM, [place] 10/13/2011 9:36 AM

## 2011-10-13 NOTE — Progress Notes (Signed)
This was a follow-up visit with pt.  She is coping well with being in the hospital for such a long stay and is appreciative of the care she is receiving and the peace of mind she has good care--she is concerned about being far away from the hospital if something should go wrong if she is discharged at 24 weeks.    We spoke about her fears and concerns as well as about some family relationship concerns she has at this time.  I offered compassionate listening and helped her to think through the spiritual implications of the complicated family dynamics.    Please page as needed, [phone].  Chaplain Katy Taylyn Brame 1:25 PM   10/13/11 1300  Clinical Encounter Type  Visited With Patient  Visit Type Spiritual support;Follow-up  Spiritual Encounters  Spiritual Needs Emotional  Stress Factors  Patient Stress Factors Family relationships

## 2011-10-14 LAB — GLUCOSE, CAPILLARY
Glucose-Capillary: 79 mg/dL (ref 70–99)
Glucose-Capillary: 90 mg/dL (ref 70–99)
Glucose-Capillary: 109 mg/dL — ABNORMAL HIGH (ref 70–99)

## 2011-10-14 NOTE — Progress Notes (Signed)
Patient ID: Kathryn Howard, female   DOB: 17-Sep-1975, 36 y.o.   MRN: [id number]  Hospital day # 30 pregnancy at [redacted]w[redacted]d  S: Feels well.  Feeling regular fetal movement.  Voiding and tol po liquids and solids without difficulty.  No issues with constipation at present.  In good spirits.  Only c/o is when baby moves at times she states " it feels like he is kicking the stitch in the cervix" which is intermittent and only      Lasts for a few seconds then goes away.  Denies any pelvic pressure.      Contractions:none-denies      Vaginal bleeding: denies         O: BP 99/59  Pulse 92  Temp 98.1 F (36.7 C) (Oral)  Resp 20  Ht 5\' 4"  (1.626 m)  Wt 103.329 kg (227 lb 12.8 oz)  BMI 39.10 kg/m2  SpO2 100%       Results for orders placed during the hospital encounter of 09/14/11 (from the past 24 hour(s))  GLUCOSE, CAPILLARY     Status: Abnormal   Collection Time   10/13/11  3:48 PM      Component Value Range   Glucose-Capillary 118 (*) 70 - 99 mg/dL   Comment 1 Notify RN     Comment 2 Documented in Chart    GLUCOSE, CAPILLARY     Status: Normal   Collection Time   10/13/11 10:42 PM      Component Value Range   Glucose-Capillary 94  70 - 99 mg/dL  GLUCOSE, CAPILLARY     Status: Normal   Collection Time   10/14/11  6:02 AM      Component Value Range   Glucose-Capillary 79  70 - 99 mg/dL  GLUCOSE, CAPILLARY     Status: Normal   Collection Time   10/14/11 11:42 AM      Component Value Range   Glucose-Capillary 90  70 - 99 mg/dL        Fetal tracings:  FHTs present with doppler every 8hr. FHR 140's-150's      Heart:  RRR      Lungs:  CTA bilat      Abd:  Soft, NT with pos BS x 4 quads      Uterus gravid and non-tender      Extremities: Neg Homan's bilat.  No significant edema.        CBGs all within range in past 24hrs ]          Last cervical length on 10/09/11-1.7cm and fetal growth at 51%ile with normal amniotic fluid volume.        A: [redacted]w[redacted]d with incompetent cervix - s/p cerclage  Type 2 Diabetes Mellitus     Stable  P: Continue current plan of care      Repeat US fetal echo scheduled for Wed, 10/18/11.   Tashi Andujo O., CNM 10/14/2011 11:58 AM

## 2011-10-15 LAB — GLUCOSE, CAPILLARY
Glucose-Capillary: 80 mg/dL (ref 70–99)
Glucose-Capillary: 113 mg/dL — ABNORMAL HIGH (ref 70–99)

## 2011-10-15 NOTE — Progress Notes (Signed)
Hospital day # 31 pregnancy at [redacted]w[redacted]d--s/p rescue cerclage 09/15/11  S:  Doing well, reports good fetal activity.  Remains anxious about potential d/c at 24 weeks--very afraid of ambulating to      BR at home.  Her mother is planning to travel to [place] from [place] when patient anticipates d/c.      Perception of contractions: None      Vaginal bleeding: None       Vaginal discharge:  Small amount yellow/white d/c--no itching or odor.  O: BP 115/65  Pulse 90  Temp 98.4 F (36.9 C) (Oral)  Resp 18  Ht 5\' 4"  (1.626 m)  Wt 227 lb 12.8 oz (103.329 kg)  BMI 39.10 kg/m2  SpO2 100%      Fetal tracings:  FHR 150 per doppler      Contractions:   None per patient      Uterus non-tender      Extremities: extremities normal, atraumatic, no cyanosis or edema and no significant edema and no signs of DVT      CBG (last 3)   Basename 10/15/11 0608 10/14/11 2232 10/14/11 1619  GLUCAP 80 88 109*           Meds: Glyburide 2000 mg at supper                 Novalog 4 units at supper                 17P each Saturday (received yesterday)  A: [redacted]w[redacted]d with rescue cerclage since 5/17.     Type 2 diabetes     Stable  P: Continue current plan of care      Upcoming tests/treatments:  Fetal echo scheduled 6/19.      MDs will decide timing for betamethasone and other plan of care issues.        Nigel Bridgeman CNM, [place] 10/15/2011 11:28 AM

## 2011-10-16 LAB — GLUCOSE, CAPILLARY
Glucose-Capillary: 90 mg/dL (ref 70–99)
Glucose-Capillary: 142 mg/dL — ABNORMAL HIGH (ref 70–99)
Glucose-Capillary: 111 mg/dL — ABNORMAL HIGH (ref 70–99)

## 2011-10-16 NOTE — Progress Notes (Signed)
Ur chart review completed.  

## 2011-10-16 NOTE — Progress Notes (Signed)
Patient ID: Kathryn Howard, female   DOB: 1975-10-18, 36 y.o.   MRN: [id number] Pt without complaints.  No leakage of fluid or VB.  Good FM.  No contractions  BP 100/57  Pulse 78  Temp 98.8 F (37.1 C) (Oral)  Resp 18  Ht 5\' 4"  (1.626 m)  Wt 227 lb 12.8 oz (103.329 kg)  BMI 39.10 kg/m2  SpO2 100%  FHTS Baseline: 145 bpm  Toco none  Pt in NAD CV RRR Lungs CTAB abd  Gravid soft and NT GU no vb EXt no calf tenderness Results for orders placed during the hospital encounter of 09/14/11 (from the past 72 hour(s))  GLUCOSE, CAPILLARY     Status: Abnormal   Collection Time   10/13/11  3:48 PM      Component Value Range Comment   Glucose-Capillary 118 (*) 70 - 99 mg/dL    Comment 1 Notify RN      Comment 2 Documented in Chart     GLUCOSE, CAPILLARY     Status: Normal   Collection Time   10/13/11 10:42 PM      Component Value Range Comment   Glucose-Capillary 94  70 - 99 mg/dL   GLUCOSE, CAPILLARY     Status: Normal   Collection Time   10/14/11  6:02 AM      Component Value Range Comment   Glucose-Capillary 79  70 - 99 mg/dL   GLUCOSE, CAPILLARY     Status: Normal   Collection Time   10/14/11 11:42 AM      Component Value Range Comment   Glucose-Capillary 90  70 - 99 mg/dL   GLUCOSE, CAPILLARY     Status: Abnormal   Collection Time   10/14/11  4:19 PM      Component Value Range Comment   Glucose-Capillary 109 (*) 70 - 99 mg/dL   GLUCOSE, CAPILLARY     Status: Normal   Collection Time   10/14/11 10:32 PM      Component Value Range Comment   Glucose-Capillary 88  70 - 99 mg/dL   GLUCOSE, CAPILLARY     Status: Normal   Collection Time   10/15/11  6:08 AM      Component Value Range Comment   Glucose-Capillary 80  70 - 99 mg/dL   GLUCOSE, CAPILLARY     Status: Normal   Collection Time   10/15/11 12:01 PM      Component Value Range Comment   Glucose-Capillary 95  70 - 99 mg/dL    Comment 1 Documented in Chart     GLUCOSE, CAPILLARY     Status: Abnormal   Collection Time   10/15/11  3:33 PM      Component Value Range Comment   Glucose-Capillary 127 (*) 70 - 99 mg/dL    Comment 1 Documented in Chart     GLUCOSE, CAPILLARY     Status: Abnormal   Collection Time   10/15/11 10:07 PM      Component Value Range Comment   Glucose-Capillary 113 (*) 70 - 99 mg/dL   GLUCOSE, CAPILLARY     Status: Normal   Collection Time   10/16/11  6:28 AM      Component Value Range Comment   Glucose-Capillary 90  70 - 99 mg/dL     Assessment and Plan [redacted]w[redacted]d  Incompetent cervix Type 2 DM blood sugars fairly controlled.  Pt for fetal echo on Wednesday She is stable continue current care

## 2011-10-17 DIAGNOSIS — O321XX0 Maternal care for breech presentation, not applicable or unspecified: Secondary | ICD-10-CM

## 2011-10-17 DIAGNOSIS — O4700 False labor before 37 completed weeks of gestation, unspecified trimester: Secondary | ICD-10-CM

## 2011-10-17 LAB — GLUCOSE, CAPILLARY
Glucose-Capillary: 92 mg/dL (ref 70–99)
Glucose-Capillary: 109 mg/dL — ABNORMAL HIGH (ref 70–99)

## 2011-10-17 MED ORDER — HYDROXYPROGESTERONE CAPROATE 250 MG/ML IM OIL: 250.0000 mg | TOPICAL_OIL | INTRAMUSCULAR | Status: DC

## 2011-10-17 NOTE — Progress Notes (Addendum)
Patient ID: Kathryn Howard, female   DOB: 17-Aug-1975, 36 y.o.   MRN: [id number]  Hospital day # 33 pregnancy at [redacted]w[redacted]d  S: in good spirits today, reports good fetal activity      Contractions:none      Vaginal bleeding:none now       Vaginal discharge: no significant change Voiding well, +BM today   O: BP 90/50  Pulse 83  Temp 98.3 F (36.8 C) (Oral)  Resp 20  Ht 5\' 4"  (1.626 m)  Wt 227 lb 12.8 oz (103.329 kg)  BMI 39.10 kg/m2  SpO2 100% CBG's stable  PE: Gen: NAD Heart RRR Lungs CTAB BS x4 Pelvic deferred       Fetal tracings:145 per doppler      Uterus gravid and non-tender, soft      Extremities: no significant edema and no signs of DVT, SCD's on  A: [redacted]w[redacted]d with incompetent cervix, and type 2 DM on insulin and metformin     stable  P: continue current plan of care MD's to follow   Malissa Hippo  MD 10/17/2011 6:35 PM

## 2011-10-18 ENCOUNTER — Encounter (HOSPITAL_COMMUNITY): Payer: Self-pay

## 2011-10-18 ENCOUNTER — Inpatient Hospital Stay (HOSPITAL_COMMUNITY): Payer: Managed Care, Other (non HMO) | Admitting: Anesthesiology

## 2011-10-18 ENCOUNTER — Encounter (HOSPITAL_COMMUNITY): Payer: Self-pay | Admitting: Anesthesiology

## 2011-10-18 ENCOUNTER — Encounter (HOSPITAL_COMMUNITY)
Admission: AD | Disposition: A | Discharge status: Home / Self Care | Payer: Self-pay | Source: Ambulatory Visit | Attending: Obstetrics and Gynecology

## 2011-10-18 LAB — URINALYSIS, ROUTINE W REFLEX MICROSCOPIC
Glucose, UA: NEGATIVE mg/dL
Specific Gravity, Urine: >1.03 — ABNORMAL HIGH (ref 1.005–1.030)
pH: 5.5 (ref 5.0–8.0)

## 2011-10-18 LAB — GLUCOSE, CAPILLARY
Glucose-Capillary: 157 mg/dL — ABNORMAL HIGH (ref 70–99)
Glucose-Capillary: 116 mg/dL — ABNORMAL HIGH (ref 70–99)

## 2011-10-18 LAB — CBC
HCT: 30 % — ABNORMAL LOW (ref 36.0–46.0)
Hemoglobin: 9.8 g/dL — ABNORMAL LOW (ref 12.0–15.0)
MCH: 25.9 pg — ABNORMAL LOW (ref 26.0–34.0)
MCH: 25.9 pg — ABNORMAL LOW (ref 26.0–34.0)
MCV: 79.2 fL (ref 78.0–100.0)
Platelets: 224 10*3/uL (ref 150–400)
RBC: 4.01 MIL/uL (ref 3.87–5.11)
RBC: 3.79 MIL/uL — ABNORMAL LOW (ref 3.87–5.11)
WBC: 16.1 10*3/uL — ABNORMAL HIGH (ref 4.0–10.5)

## 2011-10-18 LAB — URINE MICROSCOPIC-ADD ON

## 2011-10-18 SURGERY — Surgical Case
Anesthesia: Spinal | Site: Abdomen | Wound class: Clean Contaminated

## 2011-10-18 MED ORDER — FENTANYL CITRATE 0.05 MG/ML IJ SOLN
INTRAMUSCULAR | Status: DC | PRN
  Administered 2011-10-18: 25 ug via INTRAVENOUS
  Administered 2011-10-18: 50 ug via INTRAVENOUS

## 2011-10-18 MED ORDER — FENTANYL CITRATE 0.05 MG/ML IJ SOLN: 25.0000 ug | INTRAMUSCULAR | Status: DC | PRN

## 2011-10-18 MED ORDER — IBUPROFEN 600 MG PO TABS
600.0000 mg | ORAL_TABLET | Freq: Four times a day (QID) | ORAL | Status: DC
  Administered 2011-10-18 – 2011-10-22 (×17): 600 mg via ORAL
  Filled 2011-10-18 (×14): qty 1

## 2011-10-18 MED ORDER — MENTHOL 3 MG MT LOZG
1.0000 | LOZENGE | OROMUCOSAL | Status: DC | PRN
  Filled 2011-10-18: qty 9

## 2011-10-18 MED ORDER — EPHEDRINE SULFATE 50 MG/ML IJ SOLN
INTRAMUSCULAR | Status: DC | PRN
  Administered 2011-10-18: 5 mg via INTRAVENOUS
  Administered 2011-10-18: 10 mg via INTRAVENOUS
  Administered 2011-10-18: 5 mg via INTRAVENOUS
  Administered 2011-10-18 (×2): 10 mg via INTRAVENOUS

## 2011-10-18 MED ORDER — LACTATED RINGERS IV BOLUS (SEPSIS)
500.0000 mL | Freq: Once | INTRAVENOUS | Status: AC
  Administered 2011-10-18: 500 mL via INTRAVENOUS

## 2011-10-18 MED ORDER — KETOROLAC TROMETHAMINE 30 MG/ML IJ SOLN
30.0000 mg | Freq: Four times a day (QID) | INTRAMUSCULAR | Status: AC | PRN
  Filled 2011-10-18: qty 1

## 2011-10-18 MED ORDER — ONDANSETRON HCL 4 MG/2ML IJ SOLN: 4.0000 mg | INTRAMUSCULAR | Status: DC | PRN

## 2011-10-18 MED ORDER — OXYTOCIN 10 UNIT/ML IJ SOLN
INTRAMUSCULAR | Status: AC
  Filled 2011-10-18: qty 4

## 2011-10-18 MED ORDER — LACTATED RINGERS IV SOLN
INTRAVENOUS | Status: DC | PRN
  Administered 2011-10-18 (×4): via INTRAVENOUS

## 2011-10-18 MED ORDER — TETANUS-DIPHTH-ACELL PERTUSSIS 5-2.5-18.5 LF-MCG/0.5 IM SUSP
0.5000 mL | Freq: Once | INTRAMUSCULAR | Status: AC
  Administered 2011-10-22: 0.5 mL via INTRAMUSCULAR
  Filled 2011-10-18 (×2): qty 0.5

## 2011-10-18 MED ORDER — FENTANYL CITRATE 0.05 MG/ML IJ SOLN
INTRAMUSCULAR | Status: AC
  Filled 2011-10-18: qty 2

## 2011-10-18 MED ORDER — EPHEDRINE 5 MG/ML INJ
INTRAVENOUS | Status: AC
  Filled 2011-10-18: qty 10

## 2011-10-18 MED ORDER — PHENYLEPHRINE HCL 10 MG/ML IJ SOLN
INTRAMUSCULAR | Status: DC | PRN
  Administered 2011-10-18 (×6): 80 ug via INTRAVENOUS
  Administered 2011-10-18: 120 ug via INTRAVENOUS
  Administered 2011-10-18: 40 ug via INTRAVENOUS
  Administered 2011-10-18 (×5): 80 ug via INTRAVENOUS
  Administered 2011-10-18: 40 ug via INTRAVENOUS

## 2011-10-18 MED ORDER — MORPHINE SULFATE 0.5 MG/ML IJ SOLN
INTRAMUSCULAR | Status: AC
  Filled 2011-10-18: qty 10

## 2011-10-18 MED ORDER — FENTANYL CITRATE 0.05 MG/ML IJ SOLN
INTRAMUSCULAR | Status: DC | PRN
  Administered 2011-10-18: 25 ug via INTRATHECAL

## 2011-10-18 MED ORDER — DIPHENHYDRAMINE HCL 50 MG/ML IJ SOLN
12.5000 mg | INTRAMUSCULAR | Status: DC | PRN
Start: 2011-10-18 — End: 2011-10-22

## 2011-10-18 MED ORDER — MORPHINE SULFATE (PF) 0.5 MG/ML IJ SOLN
INTRAMUSCULAR | Status: DC | PRN
  Administered 2011-10-18: .15 mg via INTRATHECAL

## 2011-10-18 MED ORDER — OXYCODONE-ACETAMINOPHEN 5-325 MG PO TABS
1.0000 | ORAL_TABLET | ORAL | Status: DC | PRN
  Administered 2011-10-19 (×2): 1 via ORAL
  Administered 2011-10-19 – 2011-10-20 (×6): 2 via ORAL
  Filled 2011-10-18: qty 2
  Filled 2011-10-18 (×2): qty 1
  Filled 2011-10-18 (×5): qty 2

## 2011-10-18 MED ORDER — KETOROLAC TROMETHAMINE 30 MG/ML IJ SOLN
INTRAMUSCULAR | Status: AC
  Administered 2011-10-18: 30 mg via INTRAMUSCULAR
  Filled 2011-10-18: qty 1

## 2011-10-18 MED ORDER — OXYTOCIN 40 UNITS IN LACTATED RINGERS INFUSION - SIMPLE MED
62.5000 mL/h | INTRAVENOUS | Status: AC
  Administered 2011-10-18: 62.5 mL/h via INTRAVENOUS
  Filled 2011-10-18: qty 1000

## 2011-10-18 MED ORDER — IBUPROFEN 600 MG PO TABS
600.0000 mg | ORAL_TABLET | Freq: Four times a day (QID) | ORAL | Status: DC | PRN
  Filled 2011-10-18 (×3): qty 1

## 2011-10-18 MED ORDER — PHENYLEPHRINE 40 MCG/ML (10ML) SYRINGE FOR IV PUSH (FOR BLOOD PRESSURE SUPPORT)
PREFILLED_SYRINGE | INTRAVENOUS | Status: AC
  Filled 2011-10-18: qty 5

## 2011-10-18 MED ORDER — PRENATAL MULTIVITAMIN CH
1.0000 | ORAL_TABLET | Freq: Every day | ORAL | Status: DC
  Administered 2011-10-19 – 2011-10-22 (×2): 1 via ORAL
  Filled 2011-10-18 (×2): qty 1

## 2011-10-18 MED ORDER — ONDANSETRON HCL 4 MG/2ML IJ SOLN: 4.0000 mg | Freq: Three times a day (TID) | INTRAMUSCULAR | Status: DC | PRN

## 2011-10-18 MED ORDER — WITCH HAZEL-GLYCERIN EX PADS: 1.0000 "application " | MEDICATED_PAD | CUTANEOUS | Status: DC | PRN

## 2011-10-18 MED ORDER — METFORMIN HCL ER 500 MG PO TB24
1000.0000 mg | ORAL_TABLET | Freq: Every day | ORAL | Status: DC
  Administered 2011-10-18: 1000 mg via ORAL
  Filled 2011-10-18: qty 2

## 2011-10-18 MED ORDER — SCOPOLAMINE 1 MG/3DAYS TD PT72: 1.0000 | MEDICATED_PATCH | Freq: Once | TRANSDERMAL | Status: DC

## 2011-10-18 MED ORDER — DIPHENHYDRAMINE HCL 50 MG/ML IJ SOLN: 25.0000 mg | INTRAMUSCULAR | Status: DC | PRN

## 2011-10-18 MED ORDER — LACTATED RINGERS IV SOLN
INTRAVENOUS | Status: DC
  Administered 2011-10-18 – 2011-10-19 (×2): via INTRAVENOUS

## 2011-10-18 MED ORDER — ONDANSETRON HCL 4 MG/2ML IJ SOLN
INTRAMUSCULAR | Status: AC
  Filled 2011-10-18: qty 2

## 2011-10-18 MED ORDER — METFORMIN HCL ER 500 MG PO TB24
1000.0000 mg | ORAL_TABLET | Freq: Every day | ORAL | Status: DC
  Filled 2011-10-18: qty 2

## 2011-10-18 MED ORDER — SODIUM CHLORIDE 0.9 % IJ SOLN: 3.0000 mL | INTRAMUSCULAR | Status: DC | PRN

## 2011-10-18 MED ORDER — MIDAZOLAM HCL 2 MG/2ML IJ SOLN
INTRAMUSCULAR | Status: AC
  Filled 2011-10-18: qty 4

## 2011-10-18 MED ORDER — SENNOSIDES-DOCUSATE SODIUM 8.6-50 MG PO TABS
2.0000 | ORAL_TABLET | Freq: Every day | ORAL | Status: DC
  Administered 2011-10-18 – 2011-10-19 (×2): 2 via ORAL
  Administered 2011-10-20: 1 via ORAL
  Administered 2011-10-21: 2 via ORAL

## 2011-10-18 MED ORDER — MIDAZOLAM HCL 5 MG/5ML IJ SOLN
INTRAMUSCULAR | Status: DC | PRN
  Administered 2011-10-18 (×4): 0.5 mg via INTRAVENOUS

## 2011-10-18 MED ORDER — DIPHENHYDRAMINE HCL 25 MG PO CAPS
25.0000 mg | ORAL_CAPSULE | ORAL | Status: DC | PRN
  Filled 2011-10-18 (×2): qty 1

## 2011-10-18 MED ORDER — MEPERIDINE HCL 25 MG/ML IJ SOLN: 6.2500 mg | INTRAMUSCULAR | Status: DC | PRN

## 2011-10-18 MED ORDER — METOCLOPRAMIDE HCL 5 MG/ML IJ SOLN: 10.0000 mg | Freq: Three times a day (TID) | INTRAMUSCULAR | Status: DC | PRN

## 2011-10-18 MED ORDER — ONDANSETRON HCL 4 MG/2ML IJ SOLN
INTRAMUSCULAR | Status: DC | PRN
  Administered 2011-10-18: 4 mg via INTRAVENOUS

## 2011-10-18 MED ORDER — NALBUPHINE HCL 10 MG/ML IJ SOLN
5.0000 mg | INTRAMUSCULAR | Status: DC | PRN
  Filled 2011-10-18: qty 1

## 2011-10-18 MED ORDER — SIMETHICONE 80 MG PO CHEW
80.0000 mg | CHEWABLE_TABLET | Freq: Three times a day (TID) | ORAL | Status: DC
  Administered 2011-10-18 – 2011-10-22 (×14): 80 mg via ORAL

## 2011-10-18 MED ORDER — DIPHENHYDRAMINE HCL 25 MG PO CAPS: 25.0000 mg | ORAL_CAPSULE | Freq: Four times a day (QID) | ORAL | Status: DC | PRN

## 2011-10-18 MED ORDER — DIBUCAINE 1 % RE OINT
1.0000 "application " | TOPICAL_OINTMENT | RECTAL | Status: DC | PRN
  Filled 2011-10-18: qty 28

## 2011-10-18 MED ORDER — NALOXONE HCL 0.4 MG/ML IJ SOLN: 0.4000 mg | INTRAMUSCULAR | Status: DC | PRN

## 2011-10-18 MED ORDER — SODIUM CHLORIDE 0.9 % IV SOLN
1.0000 ug/kg/h | INTRAVENOUS | Status: DC | PRN
  Filled 2011-10-18: qty 2.5

## 2011-10-18 MED ORDER — LACTATED RINGERS IV SOLN
INTRAVENOUS | Status: DC | PRN
  Administered 2011-10-18: 07:00:00 via INTRAVENOUS

## 2011-10-18 MED ORDER — ONDANSETRON HCL 4 MG PO TABS: 4.0000 mg | ORAL_TABLET | ORAL | Status: DC | PRN

## 2011-10-18 MED ORDER — LACTATED RINGERS IV BOLUS (SEPSIS)
500.0000 mL | Freq: Once | INTRAVENOUS | Status: AC
Start: 2011-10-18 — End: 2011-10-18
  Administered 2011-10-18: 500 mL via INTRAVENOUS

## 2011-10-18 MED ORDER — NIFEDIPINE 10 MG PO CAPS
10.0000 mg | ORAL_CAPSULE | Freq: Once | ORAL | Status: AC
  Administered 2011-10-18: 10 mg via ORAL
  Filled 2011-10-18: qty 1

## 2011-10-18 MED ORDER — OXYTOCIN 10 UNIT/ML IJ SOLN
40.0000 [IU] | INTRAVENOUS | Status: DC | PRN
  Administered 2011-10-18: 40 [IU] via INTRAVENOUS

## 2011-10-18 MED ORDER — KETOROLAC TROMETHAMINE 30 MG/ML IJ SOLN
30.0000 mg | Freq: Four times a day (QID) | INTRAMUSCULAR | Status: AC | PRN
  Administered 2011-10-18: 30 mg via INTRAMUSCULAR
  Filled 2011-10-18: qty 1

## 2011-10-18 MED ORDER — BUPIVACAINE-EPINEPHRINE (PF) 0.5% -1:200000 IJ SOLN
INTRAMUSCULAR | Status: AC
  Filled 2011-10-18: qty 10

## 2011-10-18 MED ORDER — CEFAZOLIN SODIUM 1-5 GM-% IV SOLN
INTRAVENOUS | Status: DC | PRN
  Administered 2011-10-18: 2 g via INTRAVENOUS

## 2011-10-18 MED ORDER — LANOLIN HYDROUS EX OINT: 1.0000 "application " | TOPICAL_OINTMENT | CUTANEOUS | Status: DC | PRN

## 2011-10-18 MED ORDER — PHENYLEPHRINE 40 MCG/ML (10ML) SYRINGE FOR IV PUSH (FOR BLOOD PRESSURE SUPPORT)
PREFILLED_SYRINGE | INTRAVENOUS | Status: AC
  Filled 2011-10-18: qty 20

## 2011-10-18 MED ORDER — SIMETHICONE 80 MG PO CHEW
80.0000 mg | CHEWABLE_TABLET | ORAL | Status: DC | PRN
  Administered 2011-10-22: 80 mg via ORAL

## 2011-10-18 MED ORDER — BUPIVACAINE IN DEXTROSE 0.75-8.25 % IT SOLN
INTRATHECAL | Status: DC | PRN
  Administered 2011-10-18: 12.5 mg via INTRATHECAL

## 2011-10-18 MED ORDER — ZOLPIDEM TARTRATE 5 MG PO TABS: 5.0000 mg | ORAL_TABLET | Freq: Every evening | ORAL | Status: DC | PRN

## 2011-10-18 MED ORDER — CITRIC ACID-SODIUM CITRATE 334-500 MG/5ML PO SOLN
ORAL | Status: AC
  Administered 2011-10-18: 30 mL
  Filled 2011-10-18: qty 15

## 2011-10-18 MED ORDER — METFORMIN HCL 500 MG PO TABS: 500.0000 mg | ORAL_TABLET | Freq: Two times a day (BID) | ORAL | Status: DC

## 2011-10-18 MED ORDER — LACTATED RINGERS IV BOLUS (SEPSIS)
300.0000 mL | Freq: Once | INTRAVENOUS | Status: AC
  Administered 2011-10-18: 16:00:00 via INTRAVENOUS

## 2011-10-18 SURGICAL SUPPLY — 32 items
APL SKNCLS STERI-STRIP NONHPOA (GAUZE/BANDAGES/DRESSINGS) ×1
BARRIER ADHS 3X4 INTERCEED (GAUZE/BANDAGES/DRESSINGS) ×4 IMPLANT
BENZOIN TINCTURE PRP APPL 2/3 (GAUZE/BANDAGES/DRESSINGS) ×2 IMPLANT
BRR ADH 4X3 ABS CNTRL BYND (GAUZE/BANDAGES/DRESSINGS) ×2
CHLORAPREP W/TINT 26ML (MISCELLANEOUS) ×2 IMPLANT
CLOTH BEACON ORANGE TIMEOUT ST (SAFETY) ×2 IMPLANT
ELECT REM PT RETURN 9FT ADLT (ELECTROSURGICAL) ×2
ELECTRODE REM PT RTRN 9FT ADLT (ELECTROSURGICAL) ×1 IMPLANT
GLOVE BIO SURGEON STRL SZ 6.5 (GLOVE) ×2 IMPLANT
GLOVE BIO SURGEON STRL SZ7.5 (GLOVE) ×4 IMPLANT
GLOVE BIOGEL PI IND STRL 7.5 (GLOVE) ×1 IMPLANT
GLOVE BIOGEL PI INDICATOR 7.5 (GLOVE) ×1
GOWN PREVENTION PLUS LG XLONG (DISPOSABLE) ×8 IMPLANT
NEEDLE HYPO 22GX1.5 SAFETY (NEEDLE) IMPLANT
NEEDLE HYPO 25X5/8 SAFETYGLIDE (NEEDLE) IMPLANT
NS IRRIG 1000ML POUR BTL (IV SOLUTION) ×2 IMPLANT
PACK C SECTION WH (CUSTOM PROCEDURE TRAY) ×2 IMPLANT
RETRACTOR WND ALEXIS 25 LRG (MISCELLANEOUS) ×1 IMPLANT
RTRCTR WOUND ALEXIS 25CM LRG (MISCELLANEOUS) ×2
STRIP CLOSURE SKIN 1/2X4 (GAUZE/BANDAGES/DRESSINGS) ×2 IMPLANT
SUT CHROMIC 2 0 CT 1 (SUTURE) ×2 IMPLANT
SUT MNCRL AB 3-0 PS2 27 (SUTURE) ×2 IMPLANT
SUT PLAIN 2 0 XLH (SUTURE) ×4 IMPLANT
SUT VIC AB 0 CT1 36 (SUTURE) ×2 IMPLANT
SUT VIC AB 0 CTX 36 (SUTURE) ×6
SUT VIC AB 0 CTX36XBRD ANBCTRL (SUTURE) ×3 IMPLANT
SUT VIC AB 3-0 SH 27 (SUTURE) ×2
SUT VIC AB 3-0 SH 27X BRD (SUTURE) ×1 IMPLANT
SYR CONTROL 10ML LL (SYRINGE) ×2 IMPLANT
TOWEL OR 17X24 6PK STRL BLUE (TOWEL DISPOSABLE) ×4 IMPLANT
TRAY FOLEY CATH 14FR (SET/KITS/TRAYS/PACK) ×2 IMPLANT
WATER STERILE IRR 1000ML POUR (IV SOLUTION) ×2 IMPLANT

## 2011-10-18 NOTE — Anesthesia Postprocedure Evaluation (Signed)
  Anesthesia Post-op Note  Patient: Kathryn Howard  Procedure(s) Performed: Procedure(s) (LRB): CESAREAN SECTION (N/A)  Patient Location: Mother/Baby  Anesthesia Type: Spinal  Level of Consciousness: awake, alert  and oriented  Airway and Oxygen Therapy: Patient Spontanous Breathing  Post-op Pain: none  Post-op Assessment: Post-op Vital signs reviewed and Patient's Cardiovascular Status Stable  Post-op Vital Signs: Reviewed and stable  Complications: No apparent anesthesia complications

## 2011-10-18 NOTE — Addendum Note (Signed)
Addendum  created 10/18/11 1651 by Shanon Payor, CRNA   Modules edited:Notes Section

## 2011-10-18 NOTE — Progress Notes (Signed)
10/18/11 1600  Clinical Encounter Type  Visited With Patient (Several women friends present for support.)  Visit Type Follow-up  Spiritual Encounters  Spiritual Needs Grief support    Visited briefly for follow-up support and care.  Kathryn Howard was tearful and grateful for staff's care and for friends' encouraging presence.  She is working hard to grow closer to God through this loss and to pass God's "test" of this experience.  Provided empathic listening, compassionate presence.  Brought tender greetings from [employer] and other staff who have cared for Kathryn Sear and want her to know that their hearts are with her.  [address] [place], [place] [phone]

## 2011-10-18 NOTE — Progress Notes (Signed)
Ur chart review completed per request.  

## 2011-10-18 NOTE — Anesthesia Postprocedure Evaluation (Signed)
Anesthesia Post Note  Patient: Kathryn Howard  Procedure(s) Performed: Procedure(s) (LRB): CESAREAN SECTION (N/A)  Anesthesia type: Spinal  Patient location: PACU  Post pain: Pain level controlled  Post assessment: Post-op Vital signs reviewed  Last Vitals:  Filed Vitals:   10/18/11 1015  BP:   Pulse: 117  Temp:   Resp: 16    Post vital signs: Reviewed  Level of consciousness: awake  Complications: No apparent anesthesia complications

## 2011-10-18 NOTE — Transfer of Care (Signed)
Immediate Anesthesia Transfer of Care Note  Patient: Kathryn Howard  Procedure(s) Performed: Procedure(s) (LRB): CESAREAN SECTION (N/A)  Patient Location: PACU  Anesthesia Type: Spinal  Level of Consciousness: awake, alert  and oriented  Airway & Oxygen Therapy: Patient Spontanous Breathing and Patient connected to nasal cannula oxygen  Post-op Assessment: Report given to PACU RN and Post -op Vital signs reviewed and stable  Post vital signs: Reviewed and stable  Complications: No apparent anesthesia complications

## 2011-10-18 NOTE — Progress Notes (Signed)
S:  Call by pt's RN b/c of tachycardia.  She has been running around 130 bpm since arriving from PACU.  Pt has O2 on via [place] b/c she had decreased o2 saturation earlier this afternoon.  Pt's has several female visitors and their children at bedside.  Pt hasn't eaten solid food today.  Has had a cup of water since returning to antenatal. Pt denies any SOB or discomfort currently.   O:  .Marland Kitchen Filed Vitals:   10/18/11 1549 10/18/11 1550 10/18/11 1555 10/18/11 1600  BP: 118/72     Pulse: 128 125 133 125  Temp: 99.6 F (37.6 C)     TempSrc: Oral     Resp:      Height:      Weight:      SpO2:  99% 100% 100%  .Marland KitchenI/O last 3 completed shifts: In: -  Out: 151 [Urine:151] Total I/O In: 3300 [I.V.:3100; Other:200] Out: 1350 [Urine:150; Blood:1200]   CBG (last 3)   Basename 10/18/11 1547 10/18/11 1250 10/18/11 0904  GLUCAP 112* 128* 157*  PE:  Gen:  NAD, A&Ox3, sleepy         Abd:  Soft, NT, difficulty palpating fundus                  Post-op dsg c/d/i         Lochia:  Lochia, rubra, small        Ext:  WNL Urine in Foley, concentrated w/ particles noted; dk yellow/orange color O:  POD#0 s/p classical c/s at [redacted]w[redacted]d w/ neonatal demise shortly after delivery       Tachycardia--suspect due to hypovolemia        Type II DM A:  CBC just drawn (Hgb this am=10.4)      LR bolus now     U/a sent     CTO closely and c/w MD prn

## 2011-10-18 NOTE — Progress Notes (Signed)
Patient ID: Kathryn Howard, female   DOB: 03-19-1976, 36 y.o.   MRN: [id number]  Pt is G2P0 at [redacted]w[redacted]d  Atr 0600 I was called to BS by pt's RN secondary to pt c/o pain and increasing anxiety. At that time pt denies any bleeding or LOF or vaginal pressure At 0615 I entered pt room and performed spec exam, I could not visualize any cervix only what appeared to be BBOW? At VE revealed cervix complete w ?presenting fetal parts At 0623 I called Dr Su Hilt to inform of findings and she shortly then arrived at [facility]. And BSUS was performed and confirmed footling breech presentation and complete cervix.

## 2011-10-18 NOTE — Consult Note (Signed)
Neonatology Note:   Attendance at C-section:    I was asked to attend this emergency primary C/S at 23 1/[redacted] weeks GA due to breech presentation, infant's feet in vagina. The mother is a G2P0A1 O pos, GBS not done with incompetent cervix and cerclage placed early in pregnancy. She has been hospitalized since 18 weeks at bed rest, in Trendelenburg position. Found to be progressing despite these measures this morning. Parents wanted full resuscitation attempts made. Mother had not yet received Betamethasone. ROM at delivery, fluid clear. There was some difficulty getting the head delivered after footling breech presentation. The baby had a low HR at birth and some central color, but no resp effort. We bulb suctioned, clamped the cord, quickly dried him, and placed him into the portawarmer bag for temp support. We gave PPV with the neopuff, but had no chest excursion and so I intubated the baby with a 2.5 mm ETT atraumatically (tight fit) at 2 1/2 minutes of life. Noted the tube to be 6.5 cm at the lip, breath sounds could be heard bilaterally and the CO2 detector turned yellow after increasing the PIP to about 30. We then changed to a standard ambu bag and gave increased pressures; we could hear crackling breath sounds and the CO2 detector continued to be yellow, but there was no visible chest excursion. Chest compressions were started at about 3 1/2 minutes due to persistent low HR. We secured the tube and could hear bilaterally equal breath sounds, nothing over the abdomen. We continued resuscitative measures, but the baby's HR never went above 30-40 and the pulse oximeter could not pick up any pulse. He had no spontaneous movement or resp effort during the resuscitation. I spoke with his parents to let them know that he was not responding to resuscitative efforts and we discontinued chest compressions and bagging at 11 1/2 minutes. We removed the ETT and wrapped the baby so they could hold him. Ap 2/1/1. I left  the baby with the parents and their L and D nurse in the OR.    Deatra James, MD

## 2011-10-18 NOTE — Anesthesia Preprocedure Evaluation (Addendum)
Anesthesia Evaluation  Patient identified by MRN, date of birth, ID band Patient awake    Reviewed: Allergy & Precautions, H&P , NPO status , Patient's Chart, lab work & pertinent test results  Airway Mallampati: IV TM Distance: >3 FB Neck ROM: Full    Dental No notable dental hx. (+) Teeth Intact   Pulmonary neg pulmonary ROS,  breath sounds clear to auscultation  Pulmonary exam normal       Cardiovascular negative cardio ROS  Rhythm:Regular Rate:Normal     Neuro/Psych PSYCHIATRIC DISORDERS Anxiety Depression negative neurological ROS     GI/Hepatic Neg liver ROS, GERD-  Medicated and Controlled,  Endo/Other  Diabetes mellitus-, Well Controlled, Gestational, Oral Hypoglycemic AgentsMorbid obesity  Renal/GU negative Renal ROS  negative genitourinary   Musculoskeletal negative musculoskeletal ROS (+)   Abdominal (+) + obese,   Peds  Hematology negative hematology ROS (+)   Anesthesia Other Findings   Reproductive/Obstetrics (+) Pregnancy 23weeks 2 day Gestation                          Anesthesia Physical Anesthesia Plan  ASA: III and Emergent  Anesthesia Plan: Spinal   Post-op Pain Management:    Induction:   Airway Management Planned: Natural Airway  Additional Equipment:   Intra-op Plan:   Post-operative Plan:   Informed Consent: I have reviewed the patients History and Physical, chart, labs and discussed the procedure including the risks, benefits and alternatives for the proposed anesthesia with the patient or authorized representative who has indicated his/her understanding and acceptance.     Plan Discussed with: Anesthesiologist, CRNA and Surgeon  Anesthesia Plan Comments:         Anesthesia Quick Evaluation

## 2011-10-18 NOTE — Anesthesia Procedure Notes (Signed)
Spinal  Patient location during procedure: OR Start time: 10/18/2011 7:05 AM Staffing Anesthesiologist: Naasir Carreira A. Performed by: anesthesiologist  Preanesthetic Checklist Completed: patient identified, site marked, surgical consent, pre-op evaluation, timeout performed, IV checked, risks and benefits discussed and monitors and equipment checked Spinal Block Patient position: right lateral decubitus Prep: site prepped and draped and DuraPrep Patient monitoring: heart rate, cardiac monitor, continuous pulse ox and blood pressure Approach: midline Location: L3-4 Injection technique: single-shot Needle Needle type: Sprotte  Needle gauge: 24 G Needle length: 9 cm Assessment Sensory level: T4 Additional Notes Patient tolerated procedure well. Adequate sensory level.

## 2011-10-18 NOTE — Progress Notes (Signed)
10/18/11 1000  Clinical Encounter Type  Visited With Patient and family together (Husband )  Visit Type Follow-up;Spiritual support;Social support  Spiritual Encounters  Spiritual Needs Emotional;Grief support  Stress Factors  Patient Stress Factors (Bereavement; seeking strength, hope; cultural expectation.)  Family Stress Factors (Cultural expectations about bearing children/fertility/paren)    Thank you, RN Lauren from [facility], for referral for immediate bereavement support after delivery.  Spent tearful, prayerful time with Kathryn Howard, holding baby boy on her chest.  Her husband was present for a portion of the visit and has now left to meet with other people from the mosque to prepare for baby's immediate preparation and burial, according to Muslim custom.  It is very important to family that baby be released to mosque quickly; they hope for burial by noon.  Kathryn Howard recognizes the tension between honoring this very important practice of her faith and having such a short time to hold her baby before she must say goodbye.  She was gentle, tearful, full of love, praying for strength and hope, honoring and savoring her baby, while also hoping for a live baby in the future.  She understands that this baby will become an angel to watch over his parents, and she also laments his not having a chance to live.  Kathryn Howard is working very hard to know in her heart that she did everything she could for this child and hopes that he heard her prayers and love for him.   Provided compassionate presence, witness to Kathryn Howard's love and lament, affirmation of her parental identity and her having done her very best for her baby.  Joined her in prayer and tears.  Will follow up this afternoon for further support.  Kathryn Howard is taking private time with son before husband takes baby to mosque for preparation and burial.  A female friend will stay with Kathryn Howard in her new room (149; I saw pt in PACU) for comfort and support as these preparations  take place at the mosque.  Please page as needed, too.  [address] [place], [place] [phone]

## 2011-10-19 ENCOUNTER — Encounter (HOSPITAL_COMMUNITY): Payer: Self-pay | Admitting: Obstetrics and Gynecology

## 2011-10-19 DIAGNOSIS — R000 Tachycardia, unspecified: Secondary | ICD-10-CM | POA: Diagnosis not present

## 2011-10-19 DIAGNOSIS — D649 Anemia, unspecified: Secondary | ICD-10-CM | POA: Diagnosis present

## 2011-10-19 LAB — CBC
HCT: 25.9 % — ABNORMAL LOW (ref 36.0–46.0)
Hemoglobin: 8 g/dL — ABNORMAL LOW (ref 12.0–15.0)
MCH: 26.2 pg (ref 26.0–34.0)
MCH: 26.2 pg (ref 26.0–34.0)
MCV: 79 fL (ref 78.0–100.0)
MCV: 79.7 fL (ref 78.0–100.0)
RBC: 3.05 MIL/uL — ABNORMAL LOW (ref 3.87–5.11)
RDW: 14.3 % (ref 11.5–15.5)
WBC: 12.2 10*3/uL — ABNORMAL HIGH (ref 4.0–10.5)
WBC: 11 10*3/uL — ABNORMAL HIGH (ref 4.0–10.5)

## 2011-10-19 LAB — DIFFERENTIAL
Basophils Absolute: 0 10*3/uL (ref 0.0–0.1)
Eosinophils Absolute: 0 10*3/uL (ref 0.0–0.7)
Eosinophils Relative: 0 % (ref 0–5)
Lymphocytes Relative: 4 % — ABNORMAL LOW (ref 12–46)
Lymphs Abs: 0.4 10*3/uL — ABNORMAL LOW (ref 0.7–4.0)
Monocytes Absolute: 0.5 10*3/uL (ref 0.1–1.0)

## 2011-10-19 LAB — GLUCOSE, CAPILLARY
Glucose-Capillary: 107 mg/dL — ABNORMAL HIGH (ref 70–99)
Glucose-Capillary: 90 mg/dL (ref 70–99)
Glucose-Capillary: 136 mg/dL — ABNORMAL HIGH (ref 70–99)

## 2011-10-19 MED ORDER — DEXTROSE 5 % IV SOLN
2.0000 g | Freq: Two times a day (BID) | INTRAVENOUS | Status: DC
  Administered 2011-10-19 – 2011-10-22 (×7): 2 g via INTRAVENOUS
  Filled 2011-10-19 (×7): qty 2

## 2011-10-19 MED ORDER — ACETAMINOPHEN 325 MG PO TABS
650.0000 mg | ORAL_TABLET | Freq: Once | ORAL | Status: AC
  Administered 2011-10-19: 650 mg via ORAL
  Filled 2011-10-19: qty 2

## 2011-10-19 MED ORDER — DIPHENHYDRAMINE HCL 25 MG PO CAPS
25.0000 mg | ORAL_CAPSULE | Freq: Once | ORAL | Status: AC
  Administered 2011-10-19: 25 mg via ORAL

## 2011-10-19 MED ORDER — SODIUM CHLORIDE 0.45 % IV SOLN: INTRAVENOUS | Status: DC

## 2011-10-19 NOTE — Progress Notes (Signed)
Subjective: Postpartum Day 1 Cesarean Delivery Patient reports tolerating PO and + flatus, pain meds are working.    Objective: I/O last 3 completed shifts: In: 3300 [I.V.:3100; Other:200] Out: 3800 [Urine:2600; Blood:1200]  CBG (last 3)   Basename 10/19/11 1030 10/19/11 0614 10/18/11 2238  GLUCAP 90 107* 149*    Vital signs in last 24 hours:Temp:  [98.3 F (36.8 C)-100.9 F (38.3 C)] 100.9 F (38.3 C) (06/20 1043) Pulse Rate:  [70-279] 117  (06/20 0830) Resp:  [16-20] 18  (06/20 0830) BP: (100-132)/(48-81) 104/52 mmHg (06/20 0830) SpO2:  [93 %-100 %] 100 % (06/20 0626)  Physical Exam:  General: alert, cooperative and no distress Lochia: appropriate Uterine Fundus: firm Incision: dressing dry to abd incision DVT Evaluation: Negative Homan's sign. No significant calf/ankle edema. Lungs clear bilaterally AP RRR Bowel sounds active   S: comfortable, little bleeding,  A normal involution     PO day 1     Normal involution P discussed pt status with Dr. Pennie Rushing per telephone and see will come see pt,continue care Lavera Guise, CNM

## 2011-10-19 NOTE — Progress Notes (Signed)
SW received consult to see patient for assessment of need for post discharge grief management.  Patient was understandably and appropriately tearful, but very pleasant and open to SW's interventions.  SW gave [employer] brochure and explained services and most therapeutic timeframe to access.  SW recommends grief support from Hospice.  Patient willing to take information but does not want SW to make an appointment at this time.  Patient's husband appears supportive and in agreement.

## 2011-10-19 NOTE — Progress Notes (Signed)
I visited with Elexis and her husband for follow-up spiritual care and grief support.  She had spiritual support yesterday immediately after delivery and today I offered additional grief support and compassionate presence.   Please page as needed for follow-up care, [phone].  Agnes Lawrence Kwadwo Taras 10:48 AM   10/19/11 1000  Clinical Encounter Type  Visited With Patient;Family;Patient and family together  Visit Type Follow-up;Spiritual support  Referral From Chaplain  Spiritual Encounters  Spiritual Needs Emotional;Grief support;Prayer  Stress Factors  Patient Stress Factors Loss

## 2011-10-19 NOTE — Progress Notes (Signed)
RN called to room with pt c/o shivering.  CBG WNL and temperature noted 100.9.

## 2011-10-19 NOTE — Progress Notes (Signed)
Subjective: Postpartum Day one: Cesarean Delivery  The patient was evaluated at 1:30 PM Patient reports incisional pain and tolerating PO.  She complains of difficulty even changing position in bed because of incisional pain. She tolerated a regular diet.  She felt very dizzy when the nurse attempted to help her stand so she was put back to bed.  She denies shortness of breath she denies chest pain. She asked appropriate questions concerning future pregnancies given the loss of her preterm son.  We discussed the fact that she had a classical cesarean section and would require cesarean delivery for each subsequent pregnancy. She had an episode of shaking chills this morning at which time her temperature was 100.9.  She does not currently complain of any chills  Objective: Vital signs in last 24 hours: Temp:  [98.3 F (36.8 C)-101.7 F (38.7 C)] 101.6 F (38.7 C) (06/20 1515) Pulse Rate:  [70-279] 141  (06/20 1223) Resp:  [16-20] 20  (06/20 1211) BP: (94-132)/(48-81) 94/66 mmHg (06/20 1223) SpO2:  [95 %-100 %] 100 % (06/20 0626)  Physical Exam:  General: alert, cooperative, appears stated age, fatigued, mild distress and moderately obese Lochia: appropriate Uterine Fundus: firm with moderate tenderness to deep palpation Incision: dressing dry and intact DVT Evaluation: No evidence of DVT seen on physical exam.   Basename 10/19/11 1326 10/19/11 0525  HGB 8.5* 8.0*  HCT 25.9* 24.1*    Assessment/Plan: Status post Cesarean section 4 preterm labor with a footling breech presentation at 23 weeks and one day . Postoperative course complicated by  Maternal anemia, maternal tachycardia, and maternal fever, possibly endometritis. Type 2 diabetes with good control Recommendations: 1)Antibiotic therapy with Cefotan 2 g IV q.12 has been ordered. 2)transfusion to increase oxygen-carrying capacity is recommended.  The risks and benefits of transfusion were explained in detail to the patient and  her husband with 2 female visitors present.  She consents to proceed with transfusion . 3) the patient feels that she will be better able to remove and possibly ambulate with support from an abdominal binder. 4) IV fluids support and I and O. Assessment to maintain fluid balance.  Loriel Diehl P 10/19/2011, 4:14 PM

## 2011-10-20 DIAGNOSIS — Z09 Encounter for follow-up examination after completed treatment for conditions other than malignant neoplasm: Secondary | ICD-10-CM

## 2011-10-20 LAB — DIFFERENTIAL
Basophils Absolute: 0 10*3/uL (ref 0.0–0.1)
Eosinophils Relative: 0 % (ref 0–5)
Lymphocytes Relative: 5 % — ABNORMAL LOW (ref 12–46)
Lymphs Abs: 0.5 10*3/uL — ABNORMAL LOW (ref 0.7–4.0)
Neutro Abs: 8 10*3/uL — ABNORMAL HIGH (ref 1.7–7.7)

## 2011-10-20 LAB — CBC
HCT: 31.8 % — ABNORMAL LOW (ref 36.0–46.0)
MCV: 80.5 fL (ref 78.0–100.0)
Platelets: 196 10*3/uL (ref 150–400)
RBC: 3.95 MIL/uL (ref 3.87–5.11)
WBC: 8.7 10*3/uL (ref 4.0–10.5)

## 2011-10-20 LAB — TYPE AND SCREEN
ABO/RH(D): O POS
Antibody Screen: NEGATIVE
Unit division: 0

## 2011-10-20 LAB — GLUCOSE, CAPILLARY
Glucose-Capillary: 103 mg/dL — ABNORMAL HIGH (ref 70–99)
Glucose-Capillary: 115 mg/dL — ABNORMAL HIGH (ref 70–99)
Glucose-Capillary: 96 mg/dL (ref 70–99)
Glucose-Capillary: 120 mg/dL — ABNORMAL HIGH (ref 70–99)

## 2011-10-20 MED ORDER — OXYCODONE HCL 5 MG PO TABS
10.0000 mg | ORAL_TABLET | ORAL | Status: AC
  Administered 2011-10-20 – 2011-10-22 (×12): 10 mg via ORAL
  Filled 2011-10-20 (×2): qty 2
  Filled 2011-10-20: qty 1
  Filled 2011-10-20 (×3): qty 2
  Filled 2011-10-20: qty 1
  Filled 2011-10-20 (×6): qty 2

## 2011-10-20 MED ORDER — ACETAMINOPHEN 500 MG PO TABS
1000.0000 mg | ORAL_TABLET | Freq: Once | ORAL | Status: AC
  Administered 2011-10-20: 1000 mg via ORAL
  Filled 2011-10-20: qty 2

## 2011-10-20 MED ORDER — METFORMIN HCL ER 750 MG PO TB24
2000.0000 mg | ORAL_TABLET | Freq: Every day | ORAL | Status: DC
  Administered 2011-10-20 – 2011-10-21 (×2): 2000 mg via ORAL
  Filled 2011-10-20 (×2): qty 1

## 2011-10-20 MED ORDER — IBUPROFEN 600 MG PO TABS: 600.0000 mg | ORAL_TABLET | Freq: Four times a day (QID) | ORAL | Status: DC

## 2011-10-20 NOTE — Progress Notes (Signed)
VASCULAR LAB PRELIMINARY  PRELIMINARY  PRELIMINARY  PRELIMINARY  Bilateral lower extremity venous duplex  completed.    Preliminary report:  Bilateral:  No evidence of DVT, superficial thrombosis, or Baker's Cyst.  Sluggish flow noted at in the CFV at the valve.   Dakwon Wenberg, RVT 10/20/2011, 11:41 AM

## 2011-10-20 NOTE — Progress Notes (Signed)
Neg veinous duplex bilaterally, ambulation discussed. Lavera Guise, CNM

## 2011-10-20 NOTE — Progress Notes (Signed)
Blood transfusion complete 10/19/11 at 2340.

## 2011-10-20 NOTE — Progress Notes (Signed)
Subjective: Postpartum Day 2 Cesarean Delivery Patient reports tolerating PO, + flatus and no problems voiding.  Hurting when I move at the incision, states L hip and butt are feeling numb, no problems walking. Have walked in room and to bathroom.  Objective: Results for orders placed during the hospital encounter of 09/14/11 (from the past 72 hour(s))  GLUCOSE, CAPILLARY     Status: Normal   Collection Time   10/17/11 12:22 PM      Component Value Range Comment   Glucose-Capillary 85  70 - 99 mg/dL    Comment 1 Notify RN     GLUCOSE, CAPILLARY     Status: Abnormal   Collection Time   10/17/11  3:37 PM      Component Value Range Comment   Glucose-Capillary 109 (*) 70 - 99 mg/dL    Comment 1 Notify RN     GLUCOSE, CAPILLARY     Status: Abnormal   Collection Time   10/17/11  9:02 PM      Component Value Range Comment   Glucose-Capillary 100 (*) 70 - 99 mg/dL   CBC     Status: Abnormal   Collection Time   10/18/11  6:49 AM      Component Value Range Comment   WBC 16.1 (*) 4.0 - 10.5 K/uL    RBC 4.01  3.87 - 5.11 MIL/uL    Hemoglobin 10.4 (*) 12.0 - 15.0 g/dL    HCT 29.5 (*) 62.1 - 46.0 %    MCV 78.3  78.0 - 100.0 fL    MCH 25.9 (*) 26.0 - 34.0 pg    MCHC 33.1  30.0 - 36.0 g/dL    RDW 30.8  65.7 - 84.6 %    Platelets 224  150 - 400 K/uL   GLUCOSE, CAPILLARY     Status: Abnormal   Collection Time   10/18/11  9:04 AM      Component Value Range Comment   Glucose-Capillary 157 (*) 70 - 99 mg/dL   GLUCOSE, CAPILLARY     Status: Abnormal   Collection Time   10/18/11 12:50 PM      Component Value Range Comment   Glucose-Capillary 128 (*) 70 - 99 mg/dL   GLUCOSE, CAPILLARY     Status: Abnormal   Collection Time   10/18/11  3:47 PM      Component Value Range Comment   Glucose-Capillary 112 (*) 70 - 99 mg/dL   CBC     Status: Abnormal   Collection Time   10/18/11  3:54 PM      Component Value Range Comment   WBC 17.0 (*) 4.0 - 10.5 K/uL    RBC 3.79 (*) 3.87 - 5.11 MIL/uL    Hemoglobin 9.8 (*) 12.0 - 15.0 g/dL    HCT 96.2 (*) 95.2 - 46.0 %    MCV 79.2  78.0 - 100.0 fL    MCH 25.9 (*) 26.0 - 34.0 pg    MCHC 32.7  30.0 - 36.0 g/dL    RDW 84.1  32.4 - 40.1 %    Platelets 218  150 - 400 K/uL   URINALYSIS, ROUTINE W REFLEX MICROSCOPIC     Status: Abnormal   Collection Time   10/18/11  4:23 PM      Component Value Range Comment   Color, Urine AMBER (*) YELLOW BIOCHEMICALS MAY BE AFFECTED BY COLOR   APPearance HAZY (*) CLEAR    Specific Gravity, Urine >1.030 (*) 1.005 - 1.030  pH 5.5  5.0 - 8.0    Glucose, UA NEGATIVE  NEGATIVE mg/dL    Hgb urine dipstick LARGE (*) NEGATIVE    Bilirubin Urine SMALL (*) NEGATIVE    Ketones, ur 15 (*) NEGATIVE mg/dL    Protein, ur 30 (*) NEGATIVE mg/dL    Urobilinogen, UA 0.2  0.0 - 1.0 mg/dL    Nitrite POSITIVE (*) NEGATIVE    Leukocytes, UA NEGATIVE  NEGATIVE   URINE MICROSCOPIC-ADD ON     Status: Abnormal   Collection Time   10/18/11  4:23 PM      Component Value Range Comment   Squamous Epithelial / LPF FEW (*) RARE    WBC, UA 3-6  <3 WBC/hpf    RBC / HPF TOO NUMEROUS TO COUNT  <3 RBC/hpf    Bacteria, UA FEW (*) RARE   GLUCOSE, CAPILLARY     Status: Abnormal   Collection Time   10/18/11  5:59 PM      Component Value Range Comment   Glucose-Capillary 116 (*) 70 - 99 mg/dL   GLUCOSE, CAPILLARY     Status: Abnormal   Collection Time   10/18/11 10:38 PM      Component Value Range Comment   Glucose-Capillary 149 (*) 70 - 99 mg/dL   CBC     Status: Abnormal   Collection Time   10/19/11  5:25 AM      Component Value Range Comment   WBC 12.2 (*) 4.0 - 10.5 K/uL    RBC 3.05 (*) 3.87 - 5.11 MIL/uL    Hemoglobin 8.0 (*) 12.0 - 15.0 g/dL    HCT 19.1 (*) 47.8 - 46.0 %    MCV 79.0  78.0 - 100.0 fL    MCH 26.2  26.0 - 34.0 pg    MCHC 33.2  30.0 - 36.0 g/dL    RDW 29.5  62.1 - 30.8 %    Platelets 175  150 - 400 K/uL   GLUCOSE, CAPILLARY     Status: Abnormal   Collection Time   10/19/11  6:14 AM      Component Value Range  Comment   Glucose-Capillary 107 (*) 70 - 99 mg/dL   GLUCOSE, CAPILLARY     Status: Normal   Collection Time   10/19/11 10:30 AM      Component Value Range Comment   Glucose-Capillary 90  70 - 99 mg/dL    Comment 1 Documented in Chart      Comment 2 Notify RN     CBC     Status: Abnormal   Collection Time   10/19/11  1:26 PM      Component Value Range Comment   WBC 11.0 (*) 4.0 - 10.5 K/uL    RBC 3.25 (*) 3.87 - 5.11 MIL/uL    Hemoglobin 8.5 (*) 12.0 - 15.0 g/dL    HCT 65.7 (*) 84.6 - 46.0 %    MCV 79.7  78.0 - 100.0 fL    MCH 26.2  26.0 - 34.0 pg    MCHC 32.8  30.0 - 36.0 g/dL    RDW 96.2  95.2 - 84.1 %    Platelets 177  150 - 400 K/uL   DIFFERENTIAL     Status: Abnormal   Collection Time   10/19/11  1:26 PM      Component Value Range Comment   Neutrophils Relative 92 (*) 43 - 77 %    Neutro Abs 10.1 (*) 1.7 - 7.7 K/uL  Lymphocytes Relative 4 (*) 12 - 46 %    Lymphs Abs 0.4 (*) 0.7 - 4.0 K/uL    Monocytes Relative 4  3 - 12 %    Monocytes Absolute 0.5  0.1 - 1.0 K/uL    Eosinophils Relative 0  0 - 5 %    Eosinophils Absolute 0.0  0.0 - 0.7 K/uL    Basophils Relative 0  0 - 1 %    Basophils Absolute 0.0  0.0 - 0.1 K/uL   TYPE AND SCREEN     Status: Normal   Collection Time   10/19/11  1:26 PM      Component Value Range Comment   ABO/RH(D) O POS      Antibody Screen NEG      Sample Expiration 10/22/2011      Unit Number [id number]      Blood Component Type RED CELLS,LR      Unit division 00      Status of Unit ISSUED,FINAL      Transfusion Status OK TO TRANSFUSE      Crossmatch Result Compatible      Unit Number [id number]      Blood Component Type RED CELLS,LR      Unit division 00      Status of Unit ISSUED,FINAL      Transfusion Status OK TO TRANSFUSE      Crossmatch Result Compatible     PREPARE RBC (CROSSMATCH)     Status: Normal   Collection Time   10/19/11  2:00 PM      Component Value Range Comment   Order Confirmation ORDER PROCESSED BY BLOOD BANK       GLUCOSE, CAPILLARY     Status: Abnormal   Collection Time   10/19/11  3:12 PM      Component Value Range Comment   Glucose-Capillary 143 (*) 70 - 99 mg/dL    Comment 1 Documented in Chart      Comment 2 Notify RN     GLUCOSE, CAPILLARY     Status: Abnormal   Collection Time   10/19/11 11:14 PM      Component Value Range Comment   Glucose-Capillary 136 (*) 70 - 99 mg/dL   GLUCOSE, CAPILLARY     Status: Abnormal   Collection Time   10/20/11  6:04 AM      Component Value Range Comment   Glucose-Capillary 103 (*) 70 - 99 mg/dL      Vital signs in last 24 hours: BP 106/59  Pulse 113  Temp 99.3 F (37.4 C) (Oral)  Resp 20  Ht 5\' 4"  (1.626 m)  Wt 227 lb 12.8 oz (103.329 kg)  BMI 39.10 kg/m2  SpO2 100%  Breastfeeding? Unknown  Physical Exam:  General: alert and no distress Lochia: appropriate Uterine Fundus: firm Incision: healing well, no redness, edema, or drainage DVT Evaluation: no edema, pedal pulses present bilaterally, -Homan's sign R and +Homan's sign L, L calf no redness, warmth Lungs clear bilaterally AP RRR Bowel sounds hypoactive A normal involution     2 PO day      Normal involution     Potential DVT     Type 2 diatetic      Fetal loss P veinous doppler duplex today notified by RN of order, discussed plan for increased ambulation today if Neg DVT, collaboration with Dr. Pennie Rushing at [facility]. Lavera Guise, CNM

## 2011-10-20 NOTE — Progress Notes (Signed)
I did a follow-up visit with Kathryn Howard and her husband.  They seemed to be coping as well as can be expected and supporting each other better today.  I provided grief education and support and made sure they knew they had resources for follow-up support as well.  Kathleen Argue [phone] 2:16 PM   10/20/11 1400  Clinical Encounter Type  Visited With Patient and family together  Visit Type Follow-up

## 2011-10-20 NOTE — Progress Notes (Signed)
Subjective: Postpartum Day 2: Cesarean Delivery Patient reports incisional pain and tolerating PO.  No nausea or vomiting.  No flatus yet.  No BM.  Ambulating poorly because of pain.  Hip and calf pain improved  Objective: Vital signs in last 24 hours: Temp:  [98.7 F (37.1 C)-101.6 F (38.7 C)] 99 F (37.2 C) (06/21 1143) Pulse Rate:  [106-123] 107  (06/21 1143) Resp:  [17-20] 18  (06/21 1326) BP: (86-125)/(47-84) 125/66 mmHg (06/21 1143)  Physical Exam:  General: alert, cooperative and mild distress Lochia: appropriate Uterine Fundus: Firm and appropriately tender.  Bowel sounds sluggish. Incision: healing well DVT Evaluation: No evidence of DVT seen on physical exam. Dopplers of both legs negative for clot   Basename 10/19/11 1326 10/19/11 0525  HGB 8.5* 8.0*  HCT 25.9* 24.1*    Assessment/Plan: Status post Cesarean section.  S/P transfusion with improvement of tachycardia Post op pain not yet managed  Afebrile s/p antibiotics Type II DM controlled  Plan: Continue antibiotics for a total of 48 hrs CBC Encourage ambulation Analgesics changed Continue Metformin.  Discussed with Dr. Lucianne Muss who will see the pt as an outpt in 4 weeks Discharge home on 10/22/11   Yovan Leeman P 10/20/2011, 1:55 PM

## 2011-10-21 LAB — GLUCOSE, CAPILLARY: Glucose-Capillary: 90 mg/dL (ref 70–99)

## 2011-10-21 MED ORDER — BISACODYL 10 MG RE SUPP
10.0000 mg | Freq: Every day | RECTAL | Status: DC | PRN
  Filled 2011-10-21: qty 1

## 2011-10-21 NOTE — Progress Notes (Signed)
1017- house coverage carol rn notified of incident. Chair was intact when pt was assisted to sit down in it.

## 2011-10-21 NOTE — Progress Notes (Signed)
0945- Kathryn Howard nt in bathroom with pt. Pt sitting on the shower cahir drying off when chair collasped on the floor. Pt sitting on the shower chair on the floor of the shower. Pt assisted up to chair in room after drying off. Pt denies pain. States that her stitches on the rt side of the incision feel like they are pulling. Steri strips intact. No bleeding noted at site. facillities called to pick up chair. Dr. Pennie Rushing in surgery. Will have her call me when she is done.

## 2011-10-21 NOTE — Progress Notes (Signed)
Up to bathroom, voided in commode, pericare done

## 2011-10-21 NOTE — Progress Notes (Addendum)
Subjective: Postpartum Day 3: Cesarean Delivery Patient reports tolerating PO, + flatus and no problems voiding.  States "fell in shower, chair broke while sitting and drying with towel and fell straight done on butt, not feeling sore except left thigh and butt have been sore and tender for weeks with resting in bed and that continues, reports mood is good I lost a pregnancy before at least I got to spend 15 minutes with my baby this time, some gas comes out but that hurts"  Objective:  Results for orders placed during the hospital encounter of 09/14/11 (from the past 72 hour(s))  GLUCOSE, CAPILLARY     Status: Abnormal   Collection Time   10/18/11 12:50 PM      Component Value Range Comment   Glucose-Capillary 128 (*) 70 - 99 mg/dL   GLUCOSE, CAPILLARY     Status: Abnormal   Collection Time   10/18/11  3:47 PM      Component Value Range Comment   Glucose-Capillary 112 (*) 70 - 99 mg/dL   CBC     Status: Abnormal   Collection Time   10/18/11  3:54 PM      Component Value Range Comment   WBC 17.0 (*) 4.0 - 10.5 K/uL    RBC 3.79 (*) 3.87 - 5.11 MIL/uL    Hemoglobin 9.8 (*) 12.0 - 15.0 g/dL    HCT 16.1 (*) 09.6 - 46.0 %    MCV 79.2  78.0 - 100.0 fL    MCH 25.9 (*) 26.0 - 34.0 pg    MCHC 32.7  30.0 - 36.0 g/dL    RDW 04.5  40.9 - 81.1 %    Platelets 218  150 - 400 K/uL   URINALYSIS, ROUTINE W REFLEX MICROSCOPIC     Status: Abnormal   Collection Time   10/18/11  4:23 PM      Component Value Range Comment   Color, Urine AMBER (*) YELLOW BIOCHEMICALS MAY BE AFFECTED BY COLOR   APPearance HAZY (*) CLEAR    Specific Gravity, Urine >1.030 (*) 1.005 - 1.030    pH 5.5  5.0 - 8.0    Glucose, UA NEGATIVE  NEGATIVE mg/dL    Hgb urine dipstick LARGE (*) NEGATIVE    Bilirubin Urine SMALL (*) NEGATIVE    Ketones, ur 15 (*) NEGATIVE mg/dL    Protein, ur 30 (*) NEGATIVE mg/dL    Urobilinogen, UA 0.2  0.0 - 1.0 mg/dL    Nitrite POSITIVE (*) NEGATIVE    Leukocytes, UA NEGATIVE  NEGATIVE   URINE  MICROSCOPIC-ADD ON     Status: Abnormal   Collection Time   10/18/11  4:23 PM      Component Value Range Comment   Squamous Epithelial / LPF FEW (*) RARE    WBC, UA 3-6  <3 WBC/hpf    RBC / HPF TOO NUMEROUS TO COUNT  <3 RBC/hpf    Bacteria, UA FEW (*) RARE   GLUCOSE, CAPILLARY     Status: Abnormal   Collection Time   10/18/11  5:59 PM      Component Value Range Comment   Glucose-Capillary 116 (*) 70 - 99 mg/dL   GLUCOSE, CAPILLARY     Status: Abnormal   Collection Time   10/18/11 10:38 PM      Component Value Range Comment   Glucose-Capillary 149 (*) 70 - 99 mg/dL   CBC     Status: Abnormal   Collection Time   10/19/11  5:25 AM  Component Value Range Comment   WBC 12.2 (*) 4.0 - 10.5 K/uL    RBC 3.05 (*) 3.87 - 5.11 MIL/uL    Hemoglobin 8.0 (*) 12.0 - 15.0 g/dL    HCT 16.1 (*) 09.6 - 46.0 %    MCV 79.0  78.0 - 100.0 fL    MCH 26.2  26.0 - 34.0 pg    MCHC 33.2  30.0 - 36.0 g/dL    RDW 04.5  40.9 - 81.1 %    Platelets 175  150 - 400 K/uL   GLUCOSE, CAPILLARY     Status: Abnormal   Collection Time   10/19/11  6:14 AM      Component Value Range Comment   Glucose-Capillary 107 (*) 70 - 99 mg/dL   GLUCOSE, CAPILLARY     Status: Normal   Collection Time   10/19/11 10:30 AM      Component Value Range Comment   Glucose-Capillary 90  70 - 99 mg/dL    Comment 1 Documented in Chart      Comment 2 Notify RN     CBC     Status: Abnormal   Collection Time   10/19/11  1:26 PM      Component Value Range Comment   WBC 11.0 (*) 4.0 - 10.5 K/uL    RBC 3.25 (*) 3.87 - 5.11 MIL/uL    Hemoglobin 8.5 (*) 12.0 - 15.0 g/dL    HCT 91.4 (*) 78.2 - 46.0 %    MCV 79.7  78.0 - 100.0 fL    MCH 26.2  26.0 - 34.0 pg    MCHC 32.8  30.0 - 36.0 g/dL    RDW 95.6  21.3 - 08.6 %    Platelets 177  150 - 400 K/uL   DIFFERENTIAL     Status: Abnormal   Collection Time   10/19/11  1:26 PM      Component Value Range Comment   Neutrophils Relative 92 (*) 43 - 77 %    Neutro Abs 10.1 (*) 1.7 - 7.7 K/uL     Lymphocytes Relative 4 (*) 12 - 46 %    Lymphs Abs 0.4 (*) 0.7 - 4.0 K/uL    Monocytes Relative 4  3 - 12 %    Monocytes Absolute 0.5  0.1 - 1.0 K/uL    Eosinophils Relative 0  0 - 5 %    Eosinophils Absolute 0.0  0.0 - 0.7 K/uL    Basophils Relative 0  0 - 1 %    Basophils Absolute 0.0  0.0 - 0.1 K/uL   TYPE AND SCREEN     Status: Normal   Collection Time   10/19/11  1:26 PM      Component Value Range Comment   ABO/RH(D) O POS      Antibody Screen NEG      Sample Expiration 10/22/2011      Unit Number [id number]      Blood Component Type RED CELLS,LR      Unit division 00      Status of Unit ISSUED,FINAL      Transfusion Status OK TO TRANSFUSE      Crossmatch Result Compatible      Unit Number [id number]      Blood Component Type RED CELLS,LR      Unit division 00      Status of Unit ISSUED,FINAL      Transfusion Status OK TO TRANSFUSE      Crossmatch Result Compatible  PREPARE RBC (CROSSMATCH)     Status: Normal   Collection Time   10/19/11  2:00 PM      Component Value Range Comment   Order Confirmation ORDER PROCESSED BY BLOOD BANK     GLUCOSE, CAPILLARY     Status: Abnormal   Collection Time   10/19/11  3:12 PM      Component Value Range Comment   Glucose-Capillary 143 (*) 70 - 99 mg/dL    Comment 1 Documented in Chart      Comment 2 Notify RN     GLUCOSE, CAPILLARY     Status: Abnormal   Collection Time   10/19/11 11:14 PM      Component Value Range Comment   Glucose-Capillary 136 (*) 70 - 99 mg/dL   GLUCOSE, CAPILLARY     Status: Abnormal   Collection Time   10/20/11  6:04 AM      Component Value Range Comment   Glucose-Capillary 103 (*) 70 - 99 mg/dL   GLUCOSE, CAPILLARY     Status: Abnormal   Collection Time   10/20/11 11:17 AM      Component Value Range Comment   Glucose-Capillary 115 (*) 70 - 99 mg/dL    Comment 1 Documented in Chart      Comment 2 Notify RN     CBC     Status: Abnormal   Collection Time   10/20/11  1:30 PM      Component Value Range  Comment   WBC 8.7  4.0 - 10.5 K/uL    RBC 3.95  3.87 - 5.11 MIL/uL    Hemoglobin 10.6 (*) 12.0 - 15.0 g/dL    HCT 40.9 (*) 81.1 - 46.0 %    MCV 80.5  78.0 - 100.0 fL    MCH 26.8  26.0 - 34.0 pg    MCHC 33.3  30.0 - 36.0 g/dL    RDW 91.4  78.2 - 95.6 %    Platelets 196  150 - 400 K/uL   DIFFERENTIAL     Status: Abnormal   Collection Time   10/20/11  1:30 PM      Component Value Range Comment   Neutrophils Relative 92 (*) 43 - 77 %    Neutro Abs 8.0 (*) 1.7 - 7.7 K/uL    Lymphocytes Relative 5 (*) 12 - 46 %    Lymphs Abs 0.5 (*) 0.7 - 4.0 K/uL    Monocytes Relative 2 (*) 3 - 12 %    Monocytes Absolute 0.2  0.1 - 1.0 K/uL    Eosinophils Relative 0  0 - 5 %    Eosinophils Absolute 0.0  0.0 - 0.7 K/uL    Basophils Relative 0  0 - 1 %    Basophils Absolute 0.0  0.0 - 0.1 K/uL   GLUCOSE, CAPILLARY     Status: Normal   Collection Time   10/20/11  5:08 PM      Component Value Range Comment   Glucose-Capillary 96  70 - 99 mg/dL    Comment 1 Documented in Chart      Comment 2 Notify RN     GLUCOSE, CAPILLARY     Status: Abnormal   Collection Time   10/20/11 10:34 PM      Component Value Range Comment   Glucose-Capillary 120 (*) 70 - 99 mg/dL   GLUCOSE, CAPILLARY     Status: Normal   Collection Time   10/21/11  6:07 AM  Component Value Range Comment   Glucose-Capillary 90  70 - 99 mg/dL      Vital signs in last 24 hours:BP 111/67  Pulse 103  Temp 98.2 F (36.8 C) (Oral)  Resp 20  Ht 5\' 4"  (1.626 m)  Wt 227 lb 12.8 oz (103.329 kg)  BMI 39.10 kg/m2  SpO2 100%  Breastfeeding? Unknown  Physical Exam:  General: alert, cooperative and no distress Lochia: appropriate Uterine Fundus: firm Incision: no redness,edema, or drainage, with steri strips DVT Evaluation: Negative Homan's sign bilaterally Lungs clear bilaterally AP RRR Bowel sounds hypoactive abd slightly tympanic   A normal involution     Post op fever    Type 2 diabetic     Post fall P continue care,  collaboration with Dr. Pennie Rushing per telephone. Lavera Guise, CNM  Pt had a single temp elevation at about 10pm yesterday to 102 without recurrence.  WBC yesterday was normal.  Temp has not recurred.  Will continue atbx through am dose on 10/22/11, then plan d/c home if pt remains afebrile between now and then. Discussed pt's fall on shower chair with patient and nursing personnel in attendance.  Pt denies significant pain or limitation.  Has full ROM of lower extremities without tenderness to palpation.  SZP reported by nursing.

## 2011-10-22 LAB — GLUCOSE, CAPILLARY: Glucose-Capillary: 101 mg/dL — ABNORMAL HIGH (ref 70–99)

## 2011-10-22 MED ORDER — METOCLOPRAMIDE HCL 5 MG/ML IJ SOLN: 10.0000 mg | Freq: Three times a day (TID) | INTRAMUSCULAR | Status: DC | PRN

## 2011-10-22 MED ORDER — FERROUS SULFATE DRIED 200 (65 FE) MG PO TABS: 200.0000 mg | ORAL_TABLET | Freq: Two times a day (BID) | ORAL | Status: DC

## 2011-10-22 MED ORDER — IBUPROFEN 600 MG PO TABS: 600.0000 mg | ORAL_TABLET | Freq: Four times a day (QID) | ORAL | Status: AC | PRN

## 2011-10-22 MED ORDER — NORETHINDRONE-ETH ESTRADIOL 1-35 MG-MCG PO TABS: 1.0000 | ORAL_TABLET | Freq: Every day | ORAL | Status: DC

## 2011-10-22 MED ORDER — BISACODYL 10 MG RE SUPP: 10.0000 mg | Freq: Every day | RECTAL | Status: AC | PRN

## 2011-10-22 MED ORDER — ZOLPIDEM TARTRATE 5 MG PO TABS: 5.0000 mg | ORAL_TABLET | Freq: Every evening | ORAL | Status: DC | PRN

## 2011-10-22 MED ORDER — OXYCODONE-ACETAMINOPHEN 10-325 MG PO TABS: 1.0000 | ORAL_TABLET | ORAL | Status: AC | PRN

## 2011-10-22 NOTE — Discharge Instructions (Signed)

## 2011-10-22 NOTE — Discharge Summary (Signed)
Obstetric Discharge Summary Reason for Admission: incompetent cervix Prenatal Procedures: cerclage Intrapartum Procedures: cesarean: classical Postpartum Procedures: transfusion 2 units and antibiotics Complications-Operative and Postpartum: anemia tachycardia and presumed endometritis  Hospital course: the patient was admitted with preterm cervical change is at [redacted] weeks gestation. After consultation with maternal fetal medicine emphasizing the risks and benefits of rescue cerclage, the patient underwent a rescue cerclage. She was maintained on bedrest in the hospital and the plan was to continue this through viability at [redacted] weeks gestation. At 23 weeks and 1 day the patient went into active labor and a discussion was held concerning the care he early gestation and desires of the parents. In spite of the extremely poor prognosis for viability at that gestational age the patient wanted to undergo cesarean section for delivery of the fetus who was in an abnormal lie. She therefore underwent classical cesarean section for delivery of her son.  He died within the first few minutes of life of complications of extreme prematurity. Postoperatively the patient had a symptomatic anemia requiring transfusion of 2 units of packed red blood cells for a posttransfusion hemoglobin of 10.6. She also had symptoms of tachycardia and fever and was treated with intravenous antibiotics with resolution of her fever and improvement of her tachycardia. On postoperative day 4 the patient was afebrile with stable vital signs. She was taking a regular diet and ambulating. She had had a bowel movement without difficulty. And was deemed to have received maximal hospital benefit and be ready for discharge home. She received pertussis vaccine prior to her discharge. She plans to use oral contraceptive pills for contraception until she is ready to conceive again.  It is advised that she not begin trying conception for at least one year to  allow her classical cesarean incision to heal. Hemoglobin  Date Value Range Status  10/20/2011 10.6* 12.0 - 15.0 g/dL Final     DELTA CHECK NOTED     REPEATED TO VERIFY     POST TRANSFUSION SPECIMEN  07/10/2011 11.1   Final     HCT  Date Value Range Status  10/20/2011 31.8* 36.0 - 46.0 % Final  07/10/2011 35   Final    Physical Exam:  BP 112/62  Pulse 89  Temp 99 F (37.2 C) (Oral)  Resp 20  Ht 5\' 4"  (1.626 m)  Wt 227 lb 12.8 oz (103.329 kg)  BMI 39.10 kg/m2  SpO2 100%  Breastfeeding? Unknown  General: alert, cooperative, no distress and mildly obese Lochia: appropriate Uterine Fundus: firm Incision: healing well DVT Evaluation: No evidence of DVT seen on physical exam.  Discharge Diagnoses: Incompetent cervix     Preterm labor and delivery with almost immediate neonatal demise     Gestational diabetes     Polycystic ovarian syndrome     Anemia status post transfusion of 2 units of packed red blood cells     Presumed endometritis now afebrile after 48 hours of antibiotics  Discharge Information: Date: 10/22/2011 Activity: pelvic rest and As outlined in practice discharge instructions Diet: modified carbohydrate Medications: see after visit summary Condition: improved Instructions: refer to practice specific booklet Discharge to: home   Newborn Data: Live born premature female at 55 weeks. Died within first hour of life APGAR: 2, 1  followup instructions:  The patient will followup with Dr. Pennie Rushing at [facility] in 6 weeks and with Dr. Lucianne Muss at [facility] endocrinology in 4 weeks     Kingsten Enfield P 10/22/2011, 11:02 AM

## 2011-10-23 ENCOUNTER — Encounter (HOSPITAL_COMMUNITY): Payer: Self-pay | Admitting: Obstetrics and Gynecology

## 2011-10-23 NOTE — Op Note (Signed)
Cesarean Section Procedure Note  Indications: 23wks in labor fully dilated with cerclage in place and footling breech presentation.  Pre-operative Diagnosis: Complete dilation; Footling Breech   Post-operative Diagnosis: Complete dilation; Footling Breech  Procedure: CERCLAGE CERVICAL CESAREAN SECTION  Surgeon: Osborn Coho, MD  Assistants: Denny Levy, CNM  Anesthesia: Spinal  Anesthesiologist: Creta Levin, MD   Procedure Details  The patient was taken to the operating room after discussing the options, risks, benefits and alternatives.  The patient was informed several times that there is no increased survivability with a csection at this gestational age.  The patient was also informed that she would likely have a classical csection which obligates her to all future csections.  The patient verbalized understanding and questions answered.  Once again upon arrival to OR, the risks, benefits, complications, treatment options, and expected outcomes were discussed with the patient including the likelihood that the baby would not survive.  The patient continued to want to proceed with csection.  Informed consent was obtained and consent signed and witnessed. The patient was taken to Operating Room 2, identified as Kathryn Howard and the procedure verified as C-Section Delivery. A Time Out was held and the above information confirmed.  After induction of anesthesia by obtaining a spinal, the patient was prepped and draped in the usual sterile manner. A Pfannenstiel skin incision was made and carried down through the subcutaneous tissue to the underlying layer of fascia.  The fascia was incised bilaterally and extended transversely bilaterally with the Mayo scissors. Kocher clamps were placed on the inferior aspect of the fascial incision and the underlying rectus muscle was separated from the fascia. The same was done on the superior aspect of the fascial incision.  The peritoneum was identified,  entered bluntly and extended manually. The utero-vesical peritoneal reflection was incised transversely and the bladder flap was bluntly freed from the lower uterine segment.  A classical uterine incision was made with the scalpel secondary to the limitations of the lower uterine segment.  The infant was delivered in breech presentation.  After the umbilical cord was clamped and cut, the infant was handed to the awaiting pediatricians. The placenta was removed intact and appeared to be within normal limits. The uterus was cleared of all clots and debris. The uterine incision was closed with running interlocking sutures of 0 Vicryl and a second, third and fourth imbricating layers were performed.   Bilateral tubes and ovaries appeared to be within normal limits.  Good hemostasis was noted and interceed was placed over uterine incision as well.  Copious irrigation was performed until clear.  The peritoneum was repaired with 2-0 chromic via a running suture.  The fascia was reapproximated with a running suture of 0 Vicryl. The subcutaneous tissue was reapproximated with 3 interrupted sutures of 2-0 plain.  The skin was reapproximated with a subcuticular suture of 3-0 monocryl.  Steristrips were applied.  The patient was frog legged, speculum placed in vagina and cerclage visualized.  The cerclage was then removed without difficulty.  It was difficult to asses but the cerclage appeared to have torn through the cervix posteriorly.  Instrument, sponge, and needle counts were correct prior to abdominal closure and at the conclusion of the case.  The patient was awaiting transfer to the recovery room in good condition.  Findings: Live female infant delivered with unsuccessful resuscitative efforts.  Infant was deceased prior to completion of csection and I was informed by Neonatologist when code was "called". Normal appearing bilateral ovaries and  fallopian tubes were noted.  Estimated Blood Loss:  700 ml           Drains: foley to gravity (see flowsheet)         Total IV Fluids: see flowsheet         Specimens to Pathology: Placenta         Complications:  None; patient tolerated the procedure well.         Disposition: PACU - hemodynamically stable.         Condition: stable  Attending Attestation: I performed the procedure.

## 2011-10-24 LAB — URINE CULTURE: Colony Count: 10000

## 2011-10-25 ENCOUNTER — Other Ambulatory Visit: Payer: Self-pay

## 2011-10-25 ENCOUNTER — Other Ambulatory Visit: Payer: Managed Care, Other (non HMO)

## 2011-10-25 ENCOUNTER — Telehealth: Payer: Self-pay

## 2011-10-25 DIAGNOSIS — R000 Tachycardia, unspecified: Secondary | ICD-10-CM

## 2011-10-25 NOTE — Telephone Encounter (Signed)
Message copied by Rolla Plate on Wed Oct 25, 2011  3:19 PM ------      Message from: Jaymes Graff      Created: Wed Oct 25, 2011 10:08 AM       Please tell pt TSH result and she need to f/u with her endocrinologist              FYI  Pt just hsd a C/S at 23 weeks and the baby passed away.

## 2011-10-25 NOTE — Telephone Encounter (Signed)
Tc to pt per increased TSH results. Pt needs t3 and t4 testing per vph. Appt sched today @ 2:00 with lab only. Pt voices understanding.

## 2011-10-26 LAB — T4: T4, Total: 16.3 ug/dL — ABNORMAL HIGH (ref 5.0–12.5)

## 2011-10-27 ENCOUNTER — Telehealth: Payer: Self-pay

## 2011-10-27 ENCOUNTER — Ambulatory Visit (INDEPENDENT_AMBULATORY_CARE_PROVIDER_SITE_OTHER): Payer: Managed Care, Other (non HMO) | Admitting: Obstetrics and Gynecology

## 2011-10-27 ENCOUNTER — Encounter: Payer: Self-pay | Admitting: Obstetrics and Gynecology

## 2011-10-27 ENCOUNTER — Telehealth: Payer: Self-pay | Admitting: Obstetrics and Gynecology

## 2011-10-27 VITALS — BP 110/64 | Temp 98.3°F | Resp 16 | Wt 222.0 lb

## 2011-10-27 DIAGNOSIS — R300 Dysuria: Secondary | ICD-10-CM

## 2011-10-27 NOTE — Telephone Encounter (Signed)
Pt states she had c-section 10/18/11 and is experiencing pain on the right side of the incision. Pt states there is no bleeding or leaking of fluid from the incision and does not think she has had a fever. Pt also c/o urinary urgency and pain when urinating. Scheduled appointment with Corrie Dandy this afternoon, pt voiced understanding.

## 2011-10-27 NOTE — Progress Notes (Signed)
C/o burning with urine O incision well approximated no redness, edema, or drainage, scant tan discharge EGBUS WNL wet prep and ua neg A normal 2 week pp exam P UA pending discussed, f/o 4 weeks pp visit. Lavera Guise, CNM

## 2011-10-27 NOTE — Telephone Encounter (Signed)
Charetha/surg. Issues

## 2011-10-27 NOTE — Progress Notes (Signed)
DATE OF SURGERY:10-18-11 TYPE OF SURGERY:C-Section. PAIN:No VAG BLEEDING: yes VAG DISCHARGE: no NORMAL GI FUNCTN: no NORMAL GU FUNCTN: yes Temp 98.3 Oral

## 2011-10-27 NOTE — Telephone Encounter (Signed)
Pt states she is having pain on right side of her c-section incision. Pt also states she is experiencing burning when urinating and urinary urgency. Offered appointment with Corrie Dandy for this afternoon, pt accepted and voiced understanding.

## 2011-10-29 ENCOUNTER — Inpatient Hospital Stay (HOSPITAL_COMMUNITY)
Admission: AD | Admit: 2011-10-29 | Discharge: 2011-10-29 | Disposition: A | Discharge status: Home / Self Care | Payer: Managed Care, Other (non HMO) | Source: Ambulatory Visit | Attending: Obstetrics and Gynecology | Admitting: Obstetrics and Gynecology

## 2011-10-29 ENCOUNTER — Encounter (HOSPITAL_COMMUNITY): Payer: Self-pay | Admitting: *Deleted

## 2011-10-29 DIAGNOSIS — IMO0002 Reserved for concepts with insufficient information to code with codable children: Secondary | ICD-10-CM | POA: Diagnosis present

## 2011-10-29 DIAGNOSIS — O2493 Unspecified diabetes mellitus in the puerperium: Secondary | ICD-10-CM | POA: Insufficient documentation

## 2011-10-29 DIAGNOSIS — O909 Complication of the puerperium, unspecified: Secondary | ICD-10-CM | POA: Insufficient documentation

## 2011-10-29 DIAGNOSIS — E119 Type 2 diabetes mellitus without complications: Secondary | ICD-10-CM | POA: Insufficient documentation

## 2011-10-29 LAB — URINE CULTURE: Colony Count: 10000

## 2011-10-29 MED ORDER — OXYCODONE-ACETAMINOPHEN 5-325 MG PO TABS
1.0000 | ORAL_TABLET | Freq: Once | ORAL | Status: AC
  Administered 2011-10-29: 1 via ORAL
  Filled 2011-10-29: qty 1

## 2011-10-29 NOTE — MAU Note (Signed)
C/o drainage from incision;

## 2011-10-29 NOTE — MAU Provider Note (Signed)
History   36 yo G2P0110 s/p primary LTCS on 6/19 following rescue cerclage placement on 5/17, with ensuing labor on 6/19 requiring C/S due to breech--presents today c/o drainage from incision and opening of incision.  Denies any significant activity change.  Has not been checking her CBGs (is Type 2 diabetic on glyburide).  Pregnancy remarkable for: Patient Active Problem List  Diagnosis  . Type 2 diabetes mellitus  . Increased BMI  . Hx of infertility  . Female circumcision  . History of PCOS  . Incompetent cervix in pregnancy, antepartum  . Preterm labor  . Preterm delivery  . Tachycardia  . Anemia  . Neonatal death  . Seroma, postoperative     Chief Complaint  Patient presents with  . Drainage from Incision    OB History    Grav Para Term Preterm Abortions TAB SAB Ect Mult Living   2 1  1 1  1    0      Past Medical History  Diagnosis Date  . Vitamin d deficiency   . Obesity   . Gastroparesis   . Diabetes mellitus   . Acid reflux   . Fatty liver   . DUB (dysfunctional uterine bleeding)   . H pylori ulcer   . H/O rubella   . H/O varicella   . Depression     stopped meds 2012  . History of anxiety   . History of PCOS 03/07/10  . Oligomenorrhea 09/12/10  . Pelvic pain 03/14/11    right sided back  . H/O hematuria 03/14/11  . History of ovarian cyst   . Yeast infection     Past Surgical History  Procedure Date  . Dilation and curettage of uterus 2008  . Cervical cerclage 09/15/2011    Procedure: CERCLAGE CERVICAL;  Surgeon: Kirkland Hun, MD;  Location: [facility];  Service: Gynecology;  Laterality: N/A;  . Cesarean section 10/18/2011    Procedure: CESAREAN SECTION;  Surgeon: [name], MD;  Location: [facility];  Service: Gynecology;  Laterality: N/A;    Family History  Problem Relation Age of Onset  . Diabetes Mother   . Hypertension Mother   . Hyperlipidemia Mother   . Diabetes Father   . Hypertension Father     History  Substance Use Topics    . Smoking status: Never Smoker   . Smokeless tobacco: Never Used  . Alcohol Use: No    Allergies:  Allergies  Allergen Reactions  . Sulfa Antibiotics     Unknown agent in ear gtt    Prescriptions prior to admission  Medication Sig Dispense Refill  . bisacodyl (DULCOLAX) 10 MG suppository Place 1 suppository (10 mg total) rectally daily as needed.  12 suppository    . Cholecalciferol (VITAMIN D3) 5000 UNITS CAPS Take 1 capsule by mouth daily.      . Ferrous Sulfate Dried (FEOSOL) 200 (65 FE) MG TABS Take 200 mg by mouth 2 (two) times daily.  60 tablet  12  . ibuprofen (ADVIL,MOTRIN) 600 MG tablet Take 1 tablet (600 mg total) by mouth every 6 (six) hours as needed for pain.  60 tablet  3  . Inositol-Folic Acid (PREGNITUDE) 2000-200 MG-MCG PACK Take 2 packets by mouth 2 times daily at 12 noon and 4 pm. Taking 1 pack in a glass of water in the AM and PM      . metFORMIN (GLUMETZA) 1000 MG (MOD) 24 hr tablet Take 2,000 mg by mouth daily with supper. Taking with  the evening meal      . metoCLOPramide (REGLAN) 5 MG/ML injection Inject 2 mLs (10 mg total) into the vein every 8 (eight) hours as needed (nausea and vomiting).  30 mL  0  . norethindrone-ethinyl estradiol 1/35 (ORTHO-NOVUM 1/35, 28,) tablet Take 1 tablet by mouth daily.  1 Package  11  . oxyCODONE-acetaminophen (PERCOCET) 10-325 MG per tablet Take 1 tablet by mouth every 4 (four) hours as needed for pain.  30 tablet  0  . Prenatal Vit-Fe Fumarate-FA (PRENATAL MULTIVITAMIN) TABS Take 1 tablet by mouth at bedtime.      Marland Kitchen zolpidem (AMBIEN) 5 MG tablet Take 1 tablet (5 mg total) by mouth at bedtime as needed for sleep (insomnia. May repeat  Ambien 5 mg po prn insomnia x 1.).  30 tablet  0     Physical Exam   Blood pressure 121/79, pulse 91, temperature 98.5 F (36.9 C), temperature source Oral, resp. rate 16, height 5\' 4"  (1.626 m), weight 222 lb (100.699 kg), not currently breastfeeding.  Chest clear Heart RRR without murmur Abd  pannus noted, NT, fundus well-involuted Incision--right side to midline is intact, with no evidence of drainage or disruption.  Unable to explore below incision line. Left side at edge has approx 1 cm area of opening between sutures, with moderate serosanguinous drainage noted.  No foul odor to drainage.  The next suture point is slightly open, with small amount of blood oozing from area after exploration.  Both areas explore to depth of fascia, but no disruption of fascia is noted.  The left edge area explores to depth of fascial layer and approx 1/2 cm to either side.  The areas are not open enough between to allow for packing of both spaces, but irrigation solution does communicate between the two areas. Incision is without erythema along entire line.   Small area of edema noted on right hip area, removed from incisional area--likely dependent edema, no cellulitis. Patient reports this area is mildly tender to touch.  Area was irrigated with 1/2 H2O/H2O2 solution until clear--again, no pus or foul odor noted. Left edge area packed with 1/2'' gauze .  Dr. Normand Sloop in to see patient and discuss plan. ED Course  11 days s/p primary LTCS at 23 weeks, with neonatal death Wound seroma, opened spontaneously Type 2 diabetes, poorly monitored  Plan: Per consult with Dr. Normand Sloop, patient to have wound re-check and packing changed in office on Tuesday, 7/2--VL will do. Patient to check CBGs FBS and 2 hour pc. To call with any increase in drainage, any fever, heavy bleeding from incision, or any other issues or concerns.  Nigel Bridgeman, CNM, [place] 10/29/11 12:10p

## 2011-10-29 NOTE — Discharge Instructions (Signed)
Come to office for follow-up on Tuesday--Kathryn Howard will re-check incision, clean, and replace packing. Call with any questions or concerns regarding your healing.  Please check your blood sugars--fasting and 2 hours after meals.

## 2011-10-30 ENCOUNTER — Telehealth: Payer: Self-pay | Admitting: Obstetrics and Gynecology

## 2011-10-30 NOTE — Telephone Encounter (Signed)
Notified UA C&S neg results, f/o as scheduled. Lavera Guise, CNM

## 2011-10-31 ENCOUNTER — Encounter: Payer: Self-pay | Admitting: Obstetrics and Gynecology

## 2011-10-31 ENCOUNTER — Ambulatory Visit (INDEPENDENT_AMBULATORY_CARE_PROVIDER_SITE_OTHER): Payer: Managed Care, Other (non HMO) | Admitting: Obstetrics and Gynecology

## 2011-10-31 VITALS — BP 104/68 | Temp 98.3°F | Wt 218.0 lb

## 2011-10-31 DIAGNOSIS — IMO0002 Reserved for concepts with insufficient information to code with codable children: Secondary | ICD-10-CM

## 2011-10-31 DIAGNOSIS — Z5189 Encounter for other specified aftercare: Secondary | ICD-10-CM

## 2011-10-31 MED ORDER — IBUPROFEN 400 MG PO TABS: 600.0000 mg | ORAL_TABLET | Freq: Once | ORAL | Status: DC

## 2011-10-31 NOTE — Progress Notes (Signed)
Here for wound care for wound disruption from seroma s/p C/S October 19, 2022, with neonatal death due to prematurity.  Type 2 diabetes. Seen on Sunday by me in MAU--small separation of incision site due to seroma. Packed on Sunday, here for re-check and re-packing.  Incision clean, with packing removed without difficulty. Irrigated with 1/2 H2O and H2O2, no pus or drainage noted.  Approx 5 cc 1% Lidocaine injected into wound cavity for comfort prior to packing. Repacked with plain 1/2" gauze packing.  Will see me in MAU Thursday night at 7pm for wound re-check and re-packing. Ibuprophen 600 mg po given post visit--had not taken any pain medication prior to visit. Support offered to patient for loss--she was tearful today.  CBGs WNL.

## 2011-10-31 NOTE — Progress Notes (Signed)
Wound Care & Repacking

## 2011-11-02 ENCOUNTER — Inpatient Hospital Stay (HOSPITAL_COMMUNITY)
Admission: AD | Admit: 2011-11-02 | Discharge: 2011-11-02 | Disposition: A | Discharge status: Home / Self Care | Payer: Managed Care, Other (non HMO) | Source: Ambulatory Visit | Attending: Obstetrics and Gynecology | Admitting: Obstetrics and Gynecology

## 2011-11-02 DIAGNOSIS — E119 Type 2 diabetes mellitus without complications: Secondary | ICD-10-CM | POA: Insufficient documentation

## 2011-11-02 DIAGNOSIS — O909 Complication of the puerperium, unspecified: Secondary | ICD-10-CM | POA: Insufficient documentation

## 2011-11-02 DIAGNOSIS — T8131XA Disruption of external operation (surgical) wound, not elsewhere classified, initial encounter: Secondary | ICD-10-CM

## 2011-11-02 DIAGNOSIS — O2493 Unspecified diabetes mellitus in the puerperium: Secondary | ICD-10-CM | POA: Insufficient documentation

## 2011-11-02 DIAGNOSIS — IMO0002 Reserved for concepts with insufficient information to code with codable children: Secondary | ICD-10-CM | POA: Diagnosis present

## 2011-11-02 MED ORDER — IBUPROFEN 600 MG PO TABS
600.0000 mg | ORAL_TABLET | Freq: Once | ORAL | Status: AC
  Administered 2011-11-02: 600 mg via ORAL
  Filled 2011-11-02: qty 1

## 2011-11-02 MED ORDER — OXYCODONE-ACETAMINOPHEN 5-325 MG PO TABS: 1.0000 | ORAL_TABLET | ORAL | Status: AC | PRN

## 2011-11-02 MED ORDER — LIDOCAINE HCL (PF) 1 % IJ SOLN
INTRAMUSCULAR | Status: AC
  Administered 2011-11-02: 1 mL
  Filled 2011-11-02: qty 30

## 2011-11-02 MED ORDER — LIDOCAINE HCL (PF) 1 % IJ SOLN
30.0000 mL | Freq: Once | INTRAMUSCULAR | Status: AC
  Administered 2011-11-02: 1 mL

## 2011-11-02 NOTE — MAU Note (Signed)
Pt presents to MAU for dressing change

## 2011-11-02 NOTE — MAU Provider Note (Signed)
History   36 yo G2P0110 s/p LTCS on 6/19 at 23 weeks, with subsequent neonatal death from prematurity, presented for incision check and packing change--she had a small seroma of the incision on 6/30, causing approx 1 cm disruption of the incision on the right side, down to the fascial layer, but with fascia intact.  The area was cleaned and packed with 1/2" gauze--this was repeated on Tuesday in the office, and she was instructed to return tonight for recheck and packing.  She is doing well overall and coping with the loss of her baby.  Husband is present with her and very supportive.  She is taking minimal pain medication (Ibuprophen only), and has not taken any today.  Patient is monitoring her CBGs more routinely now (Type 2 diabetic)  Patient was also on complete BR for 1 month prior to delivery, s/p rescue cerclage.  Still feels muscles are weak, but able to ambulate without difficulty.  Patient Active Problem List  Diagnosis  . Type 2 diabetes mellitus  . Increased BMI  . Hx of infertility  . Female circumcision  . History of PCOS  . Incompetent cervix in pregnancy, antepartum  . Preterm labor  . Preterm delivery  . Tachycardia  . Anemia  . Neonatal death  . Seroma, postoperative     Chief Complaint  Patient presents with  . Dressing Change     OB History    Grav Para Term Preterm Abortions TAB SAB Ect Mult Living   2 1  1 1  1    0      Past Medical History  Diagnosis Date  . Vitamin d deficiency   . Obesity   . Gastroparesis   . Diabetes mellitus   . Acid reflux   . Fatty liver   . DUB (dysfunctional uterine bleeding)   . H pylori ulcer   . H/O rubella   . H/O varicella   . Depression     stopped meds 2012  . History of anxiety   . History of PCOS 03/07/10  . Oligomenorrhea 09/12/10  . Pelvic pain 03/14/11    right sided back  . H/O hematuria 03/14/11  . History of ovarian cyst   . Yeast infection     Past Surgical History  Procedure Date  . Dilation  and curettage of uterus 2008  . Cervical cerclage 09/15/2011    Procedure: CERCLAGE CERVICAL;  Surgeon: Kirkland Hun, MD;  Location: [facility];  Service: Gynecology;  Laterality: N/A;  . Cesarean section 10/18/2011    Procedure: CESAREAN SECTION;  Surgeon: [name], MD;  Location: [facility];  Service: Gynecology;  Laterality: N/A;    Family History  Problem Relation Age of Onset  . Diabetes Mother   . Hypertension Mother   . Hyperlipidemia Mother   . Diabetes Father   . Hypertension Father     History  Substance Use Topics  . Smoking status: Never Smoker   . Smokeless tobacco: Never Used  . Alcohol Use: No    Allergies:  Allergies  Allergen Reactions  . Sulfa Antibiotics     Unknown agent in ear gtt    Prescriptions prior to admission  Medication Sig Dispense Refill  . bisacodyl (DULCOLAX) 10 MG suppository Place 1 suppository (10 mg total) rectally daily as needed.  12 suppository    . Cholecalciferol (VITAMIN D3) 5000 UNITS CAPS Take 1 capsule by mouth daily.      . Ferrous Sulfate Dried (FEOSOL) 200 (65 FE)  MG TABS Take 200 mg by mouth 2 (two) times daily.  60 tablet  12  . ibuprofen (ADVIL,MOTRIN) 600 MG tablet Take 1 tablet (600 mg total) by mouth every 6 (six) hours as needed for pain.  60 tablet  3  . Inositol-Folic Acid (PREGNITUDE) 2000-200 MG-MCG PACK Take 2 packets by mouth 2 times daily at 12 noon and 4 pm. Taking 1 pack in a glass of water in the AM and PM      . metFORMIN (GLUMETZA) 1000 MG (MOD) 24 hr tablet Take 2,000 mg by mouth daily with supper. Taking with the evening meal      . metoCLOPramide (REGLAN) 5 MG/ML injection Inject 2 mLs (10 mg total) into the vein every 8 (eight) hours as needed (nausea and vomiting).  30 mL  0  . norethindrone-ethinyl estradiol 1/35 (ORTHO-NOVUM 1/35, 28,) tablet Take 1 tablet by mouth daily.  1 Package  11  . oxyCODONE-acetaminophen (PERCOCET) 10-325 MG per tablet Take 1 tablet by mouth every 4 (four) hours as needed for  pain.  30 tablet  0  . Prenatal Vit-Fe Fumarate-FA (PRENATAL MULTIVITAMIN) TABS Take 1 tablet by mouth at bedtime.      Marland Kitchen zolpidem (AMBIEN) 5 MG tablet Take 1 tablet (5 mg total) by mouth at bedtime as needed for sleep (insomnia. May repeat  Ambien 5 mg po prn insomnia x 1.).  30 tablet  0     Physical Exam   Blood pressure 105/66, pulse 80, temperature 98 F (36.7 C), temperature source Oral, resp. rate 18, height 5\' 5"  (1.651 m), weight 215 lb (97.523 kg), not currently breastfeeding.  Chest clear Heart RRR without murmur Abd soft, fundus well-involuted, NT. Incision--approx 1 cm disruption of incision at left border.  Wound edges are clean and pink.  1/2" packing removed--no active drainage, with packing stained with yellow drainage, but no foul odor.  Remainder of incision is intact and very clean, healing well. Ext WNL  Consulted with Dr. Rivard--recommended packing with Caltostat to lower edge of wound (avoiding having any Caltostat extruding out of the opening to allow for appropriate healing). Area irrigated with 1/2 H2O2 and sterile saline. 1% Lidocaine instilled in cavity of wound prior to packing for patient comfort. Caltostat packed into incision, filling the wound to a level just below the opening.    ED Course  16 days s/p primary LTCS at 23 weeks, with neonatal death Wound disruption due to seroma on 6/30 Type 2 diabetes  Plan: Per Dr. Estanislado Pandy, wound needs to be re-packed every 2 days.   Office will investigate option for home health to do wound care every 2 day, incorporating cleaning with 1/2 hydrogen peroxide and sterile water and re-packing with Caltostat, to avoid patient having to come to hospital or office. If Home Health care not available, patient will need to return to MAU on Saturday, 11/04/11, for same care. Office will notify patient on Friday of plan of care. Patient and husband agreeable with plan.  Nigel Bridgeman, CNM, [place] 11/02/11 8:40pm

## 2011-11-03 ENCOUNTER — Telehealth: Payer: Self-pay | Admitting: Obstetrics and Gynecology

## 2011-11-03 NOTE — Telephone Encounter (Signed)
TC to [facility] d/t msg below.  Per [facility], d/t where pt lives [place]), they do not have that many resources in the area that can do wound care QOD as well as they do not take her insurance Counselling psychologist).  Pt informed will need to go to MAU on tomorrow 11/04/11 for next wound care and on Monday will work on finding a home health company in her insurance network that can go out to do her wound care QOD.  Pt states too, that her address is [place], but she lives only 10 min from [facility].  Pt agrees to go to MAU tomorrow and will work on finding a home care company that does wound care.

## 2011-11-03 NOTE — Telephone Encounter (Signed)
Message copied by Delon Sacramento on Fri Nov 03, 2011  4:16 PM ------      Message from: Cornelius Moras      Created: Thu Nov 02, 2011  8:41 PM      Regarding: Need to investigate home health for wound care       See notes from MAU visit 11/02/11.      Please check with [facility] to see if home health nurses can be arranged for wound care every 2 days as described in the note.      If not (or if it just can't be arranged that quickly), please notify patient that she will need to come to MAU on Saturday, 11/04/11 for wound care.            Thanks!      VL

## 2011-11-04 ENCOUNTER — Inpatient Hospital Stay (HOSPITAL_COMMUNITY)
Admission: AD | Admit: 2011-11-04 | Discharge: 2011-11-04 | Disposition: A | Discharge status: Home / Self Care | Payer: Managed Care, Other (non HMO) | Source: Ambulatory Visit | Attending: Obstetrics and Gynecology | Admitting: Obstetrics and Gynecology

## 2011-11-04 ENCOUNTER — Encounter (HOSPITAL_COMMUNITY): Payer: Self-pay | Admitting: *Deleted

## 2011-11-04 DIAGNOSIS — O909 Complication of the puerperium, unspecified: Secondary | ICD-10-CM | POA: Insufficient documentation

## 2011-11-04 DIAGNOSIS — T8131XA Disruption of external operation (surgical) wound, not elsewhere classified, initial encounter: Secondary | ICD-10-CM

## 2011-11-04 MED ORDER — LIDOCAINE HCL (PF) 1 % IJ SOLN
30.0000 mL | Freq: Once | INTRAMUSCULAR | Status: DC
  Filled 2011-11-04: qty 30

## 2011-11-04 NOTE — MAU Provider Note (Signed)
History     CSN: [id number]  Arrival date and time: 11/04/11 1221   First Provider Initiated Contact with Patient 11/04/11 1325      Chief Complaint  Patient presents with  . Wound Check   HPI Comments: Pt s/p primary c/s on 6-19 at 23wks w subsequent neonatal death. Pt is appropriately grieving, sig other at bs w pt. Pt is type 2 diabetic.  Here for wound care, had wound cleaning and packing on 7-4 w 1/2 hydrogen peroxide and sterile water and packing with Caltostat per V.Emilee Hero, CNM,     Past Medical History  Diagnosis Date  . Vitamin d deficiency   . Obesity   . Gastroparesis   . Diabetes mellitus   . Acid reflux   . Fatty liver   . DUB (dysfunctional uterine bleeding)   . H pylori ulcer   . H/O rubella   . H/O varicella   . Depression     stopped meds 2012  . History of anxiety   . History of PCOS 03/07/10  . Oligomenorrhea 09/12/10  . Pelvic pain 03/14/11    right sided back  . H/O hematuria 03/14/11  . History of ovarian cyst   . Yeast infection     Past Surgical History  Procedure Date  . Dilation and curettage of uterus 2008  . Cervical cerclage 09/15/2011    Procedure: CERCLAGE CERVICAL;  Surgeon: Kirkland Hun, MD;  Location: [facility];  Service: Gynecology;  Laterality: N/A;  . Cesarean section 10/18/2011    Procedure: CESAREAN SECTION;  Surgeon: [name], MD;  Location: [facility];  Service: Gynecology;  Laterality: N/A;    Family History  Problem Relation Age of Onset  . Diabetes Mother   . Hypertension Mother   . Hyperlipidemia Mother   . Diabetes Father   . Hypertension Father     History  Substance Use Topics  . Smoking status: Never Smoker   . Smokeless tobacco: Never Used  . Alcohol Use: No    Allergies:  Allergies  Allergen Reactions  . Sulfa Antibiotics Swelling    Unknown agent in ear drops    Prescriptions prior to admission  Medication Sig Dispense Refill  . Cholecalciferol (VITAMIN D3) 5000 UNITS CAPS Take 1 capsule by  mouth daily.      . Ferrous Sulfate Dried (FEOSOL) 200 (65 FE) MG TABS Take 200 mg by mouth 2 (two) times daily.  60 tablet  12  . ibuprofen (ADVIL,MOTRIN) 600 MG tablet Take 600 mg by mouth as needed. For pain      . Inositol-Folic Acid (PREGNITUDE) 2000-200 MG-MCG PACK Take 2 packets by mouth 2 times daily at 12 noon and 4 pm. Taking 1 pack in a glass of water in the AM and PM      . metFORMIN (GLUMETZA) 1000 MG (MOD) 24 hr tablet Take 2,000 mg by mouth daily with supper. Taking with the evening meal      . oxyCODONE-acetaminophen (ROXICET) 5-325 MG per tablet Take 1 tablet by mouth every 4 (four) hours as needed for pain.  30 tablet  0  . Prenatal Vit-Fe Fumarate-FA (PRENATAL MULTIVITAMIN) TABS Take 1 tablet by mouth daily.       Marland Kitchen zolpidem (AMBIEN) 5 MG tablet Take 1 tablet (5 mg total) by mouth at bedtime as needed for sleep (insomnia. May repeat  Ambien 5 mg po prn insomnia x 1.).  30 tablet  0  . norethindrone-ethinyl estradiol 1/35 (ORTHO-NOVUM 1/35, 28,) tablet  Take 1 tablet by mouth daily.  1 Package  11  . DISCONTD: metoCLOPramide (REGLAN) 5 MG/ML injection Inject 2 mLs (10 mg total) into the vein every 8 (eight) hours as needed (nausea and vomiting).  30 mL  0    Review of Systems  Constitutional: Negative for fever and chills.  Gastrointestinal:       Some incisional pain, but well controlled w PO meds  Psychiatric/Behavioral: Negative for depression and suicidal ideas. The patient is nervous/anxious.   All other systems reviewed and are negative.   Physical Exam   Blood pressure 110/63, pulse 83, temperature 98.5 F (36.9 C), temperature source Oral, resp. rate 18, not currently breastfeeding.  Physical Exam  Nursing note and vitals reviewed. Constitutional: She is oriented to person, place, and time. She appears well-developed and well-nourished. She appears distressed.       Pt appears very anxious and worried and asks repeatedly about condition of healing,   HENT:    Head: Normocephalic.  Neck: Normal range of motion.  Cardiovascular: Normal rate.   Respiratory: Effort normal.  GI: Soft. She exhibits no distension. There is no tenderness.  Genitourinary:       Deferred   Musculoskeletal: Normal range of motion. She exhibits edema.  Neurological: She is alert and oriented to person, place, and time.  Skin: Skin is warm and dry.  Psychiatric: She has a normal mood and affect. Her behavior is normal.    MAU Course  Procedures    Assessment and Plan  S/p primary classical c/s  No s/s wound infection L side of c/s incision w sm amt greenish thick d/c about .5cm area and .5cm deep, sm amt of previous packing removed, no odor noted and wound appears clean, irrigated w 1/2 hydrogen peroxide and sterile water and then 1% lidocaine,  Repacked w sm amt of caltostat gauze  Middle area of incision appears to be healing well, no erythema noted, on R side of incision about 2cm long area of slight weeping noted, is intact, however.  Plan to have home health f/u for wound repacking on Monday - will have office call pt.  rv'd w Dr Su Hilt.    Ozro Russett M 11/04/2011, 1:44 PM

## 2011-11-04 NOTE — MAU Note (Signed)
C/s on 6/19 incision opened on left one week ago now right side skin is peeled up has had left side packed.

## 2011-11-05 ENCOUNTER — Inpatient Hospital Stay (HOSPITAL_COMMUNITY)
Admission: AD | Admit: 2011-11-05 | Discharge: 2011-11-05 | Disposition: A | Discharge status: Home / Self Care | Payer: Managed Care, Other (non HMO) | Source: Ambulatory Visit | Attending: Obstetrics and Gynecology | Admitting: Obstetrics and Gynecology

## 2011-11-05 ENCOUNTER — Encounter (HOSPITAL_COMMUNITY): Payer: Self-pay | Admitting: *Deleted

## 2011-11-05 DIAGNOSIS — O900 Disruption of cesarean delivery wound: Secondary | ICD-10-CM

## 2011-11-05 DIAGNOSIS — IMO0002 Reserved for concepts with insufficient information to code with codable children: Secondary | ICD-10-CM | POA: Diagnosis present

## 2011-11-05 DIAGNOSIS — O909 Complication of the puerperium, unspecified: Secondary | ICD-10-CM | POA: Insufficient documentation

## 2011-11-05 NOTE — MAU Provider Note (Signed)
Here for wound check--having qod wound check for repacking, due to seroma and wound separation on left side of incision.  Called CNM this am to report "leaking and peeling" on right side of incision.  Denies fever.  Reports CBGs have been stable/low.  Patient Active Problem List  Diagnosis  . Type 2 diabetes mellitus  . Increased BMI  . Hx of infertility  . Female circumcision  . History of PCOS  . Incompetent cervix in pregnancy, antepartum  . Preterm labor  . Preterm delivery  . Tachycardia  . Anemia  . Neonatal death  . Seroma, postoperative  . Cesarean wound disruption   Filed Vitals:   11/05/11 1106  BP: 103/65  Pulse: 96  Temp: 98.3 F (36.8 C)  TempSrc: Oral  Resp: 16  Height: 5\' 5"  (1.651 m)  Weight: 215 lb (97.523 kg)  SpO2: 100%   Chest clear Heart RRR without murmur Abd soft, NT Ext WNL  Wound assessment: Right side of incision has approx 2 cm area of healing wound edge, with wetness of tissue noted, but no active bleeding or drainage--appears to be an edge of incision that rolled outward vs granulation tissue.  No cellulitis noted, unable to explore into incision at that area.  Incision edges are intact, moderately tender to touch.  Small amount of skin peeling noted along lower border of incision.  Left side of incision--Caltostat still in place, with clean edges of incision, no cellulitis or erythema.  Plan: Reassured patient at present.  Will have patient be seen in office on Monday (already planned for wound care)--office may be able to arrange home health for incision care, but not yet coordinated. Wound care reviewed.  Nigel Bridgeman, CNM, [place] 11/05/11 12:15p

## 2011-11-05 NOTE — MAU Note (Signed)
Pt was seen here in MAU yesterday for Left sided wound drainage and skin peeling on the right.  Today she states she had drainage coming from the right side of her incision.  Pt states the fluid is pinkish in color without any odor.

## 2011-11-06 ENCOUNTER — Ambulatory Visit (INDEPENDENT_AMBULATORY_CARE_PROVIDER_SITE_OTHER): Payer: Managed Care, Other (non HMO) | Admitting: Obstetrics and Gynecology

## 2011-11-06 ENCOUNTER — Telehealth: Payer: Self-pay | Admitting: Obstetrics and Gynecology

## 2011-11-06 ENCOUNTER — Encounter: Payer: Self-pay | Admitting: Obstetrics and Gynecology

## 2011-11-06 VITALS — BP 100/72 | Temp 98.2°F | Wt 212.0 lb

## 2011-11-06 DIAGNOSIS — O900 Disruption of cesarean delivery wound: Secondary | ICD-10-CM

## 2011-11-06 DIAGNOSIS — L089 Local infection of the skin and subcutaneous tissue, unspecified: Secondary | ICD-10-CM

## 2011-11-06 NOTE — Progress Notes (Signed)
HISTORY OF PRESENT ILLNESS  [name] is a 36 y.o. year old female,G2P0110, who presents for a problem visit. She had a cesarean section for a nonviable infant on October 18, 2011.  She has a wound disruption and wound infection. She is cleaning her incision once each day with H2O2. She has type II diabetes. Subjective:  C/O discharge from incision and pealing skin.  Objective:  BP 100/72  Temp 98.2 F (36.8 C) (Oral)  Wt 212 lb (96.163 kg)   General: alert, cooperative and no distress GI: incision: granulating well on the left. No sign of infection or cellulitis. The  skin is closed on the right  and slightly hypertrophied. Again, no sign of active infection.  Procedure:  Incision cleaned with H2O2 and saline. Good granulation tissue noted. Packed. Covered.  Assessment:  Wound disruption; healing after wound infection.  Plan:  Clean TID with H2O2 and saline. Apply antibiotic ointment. Pack. Cover. Home nurse to come to her home.  Return to office prn if symptoms worsen or fail to improve.   Leonard Schwartz M.D.  11/06/2011 1:54 PM

## 2011-11-06 NOTE — Telephone Encounter (Signed)
Message copied by Delon Sacramento on Mon Nov 06, 2011 12:15 PM ------      Message from: Cornelius Moras      Created: Sun Nov 05, 2011 12:14 PM      Regarding: patient needs wound care at office on Monday       See my MAU note from today.      Needs to be seen in the office by EP or MD.  Has transportation top office for appointment at 11:30a.            Thanks!            VL

## 2011-11-06 NOTE — Progress Notes (Signed)
Pt states her incision is open on left side with peeling on right side. Pt states that the incision is tender to touch with a little redness

## 2011-11-06 NOTE — Telephone Encounter (Signed)
TC to [facility] , a home healthcare company thru [employer], all pertinent info given, will go out to pt's home for 6 visits QOD.  Will fax Rx, H&P, and all pertinent info regarding dx.  Pt made aware, RN will start wound care on Thursday 11/09/11.  Pt will come into office today to see AVS for incision check/wound dressing change today @ 1300.

## 2011-11-08 ENCOUNTER — Telehealth: Payer: Self-pay

## 2011-11-08 NOTE — Telephone Encounter (Signed)
Tc to pt per test results. Told pt T3-wnl and T4-elevated. Pt to follow up with Dr. Lucianne Muss rgdg results. Pt states,"already had an appt with Dr. Lucianne Muss yesterday". Next follow up in 2 months. Informed pt will fax results to Dr.Kumar's office and pt will call today to follow up rgdg results.  Pt voices understanding.

## 2011-11-09 ENCOUNTER — Encounter: Payer: Self-pay | Admitting: Obstetrics and Gynecology

## 2011-11-09 ENCOUNTER — Ambulatory Visit (INDEPENDENT_AMBULATORY_CARE_PROVIDER_SITE_OTHER): Payer: Managed Care, Other (non HMO) | Admitting: Obstetrics and Gynecology

## 2011-11-09 VITALS — BP 100/62 | Temp 98.5°F | Wt 211.0 lb

## 2011-11-09 DIAGNOSIS — Z5189 Encounter for other specified aftercare: Secondary | ICD-10-CM

## 2011-11-09 NOTE — Patient Instructions (Addendum)
Take Docusate Sodium 100 mg 3 daily to help with constipation.  If you need a laxative you may take Miralax

## 2011-11-09 NOTE — Progress Notes (Signed)
36 YO for wound care due to C-section 2011/10/24 (infant deceased) with subsequent incision separation.  Patient's mother has been changing her wound dressing and Kaltosat packing three times a day.  Is scheduled to begin Home Health wound care on tomorrow.  Denies any fever, increased pain or significant drainage.  Patient also reports that she continues to have constipation.   O:  Abdomen: soft, incision intact without evidence of infection except for a 1 cm superficial separation at the left quarter of incision line. deepest portion of this separation is 4 mm;  area swabbed with saline and small amount of Kaltostat packing placed and dressed with sterile plain gauze 4 x 4  A:  Wound Care      S/P C-section (10-24-2011-infant deceased)      Constipation  P:  Continue wound care with Home Health 3 times a week (explained tid is not necessary)        Bowel hygiene reviewed, may use Stool Softeners 3/d or Miralax as directed       Per patient, she's to see Dr. Pennie Rushing 6 weeks from her C-section  RTO- at the end of July or first of August      for follow up with Dr. Wallace Keller, PA-C

## 2011-11-25 ENCOUNTER — Encounter (HOSPITAL_COMMUNITY): Payer: Self-pay | Admitting: Obstetrics and Gynecology

## 2011-11-25 ENCOUNTER — Inpatient Hospital Stay (HOSPITAL_COMMUNITY)
Admission: AD | Admit: 2011-11-25 | Discharge: 2011-11-25 | Disposition: A | Discharge status: Home / Self Care | Payer: Managed Care, Other (non HMO) | Source: Ambulatory Visit | Attending: Obstetrics and Gynecology | Admitting: Obstetrics and Gynecology

## 2011-11-25 DIAGNOSIS — O99893 Other specified diseases and conditions complicating puerperium: Secondary | ICD-10-CM | POA: Insufficient documentation

## 2011-11-25 DIAGNOSIS — K5900 Constipation, unspecified: Secondary | ICD-10-CM | POA: Insufficient documentation

## 2011-11-25 DIAGNOSIS — O2493 Unspecified diabetes mellitus in the puerperium: Secondary | ICD-10-CM | POA: Insufficient documentation

## 2011-11-25 DIAGNOSIS — Z5189 Encounter for other specified aftercare: Secondary | ICD-10-CM

## 2011-11-25 DIAGNOSIS — T8131XA Disruption of external operation (surgical) wound, not elsewhere classified, initial encounter: Secondary | ICD-10-CM

## 2011-11-25 DIAGNOSIS — E119 Type 2 diabetes mellitus without complications: Secondary | ICD-10-CM | POA: Insufficient documentation

## 2011-11-25 DIAGNOSIS — O909 Complication of the puerperium, unspecified: Secondary | ICD-10-CM | POA: Insufficient documentation

## 2011-11-25 DIAGNOSIS — O900 Disruption of cesarean delivery wound: Secondary | ICD-10-CM

## 2011-11-25 LAB — URINALYSIS, ROUTINE W REFLEX MICROSCOPIC
Bilirubin Urine: NEGATIVE
Ketones, ur: NEGATIVE mg/dL
Nitrite: NEGATIVE
Protein, ur: NEGATIVE mg/dL
pH: 6 (ref 5.0–8.0)

## 2011-11-25 NOTE — MAU Provider Note (Signed)
History     CSN: [id number]  Arrival date and time: 11/25/11 1448   First Provider Initiated Contact with Patient 11/25/11 1541      Chief Complaint  Patient presents with  . Incisional Pain   HPI Pt presents to MAU with c/o "opening and drainage of right side of incision" since this AM with some increased pain in the area.  States she contacted the home health provider who had been seeing her 3x/wk due to disruption of incision and was told to call our office.  Also c/o constipation and abdominal pain due to flatus.  Denies fever.  States she has some occas mild nausea but no vomiting.  Denies any vaginal bleeding.  She questions whether she needs antibiotics.  She reports some urinary frequency and reports hx of frequent UTIs in the past.  Denies any back pain.  She states her blood sugars have been well controlled on her current medications and she saw her endocrinologist approximately 3 wks ago and her medication dosage was decreased by 1/2 and her Hgb A1C was 5.5%.  She states she is using only Motrin prn for pain with adequate relief.   OB History    Grav Para Term Preterm Abortions TAB SAB Ect Mult Living   2 1 0 1 1 0 1 0 0 0       Past Medical History  Diagnosis Date  . Vitamin d deficiency   . Obesity   . Gastroparesis   . Diabetes mellitus   . Acid reflux   . Fatty liver   . DUB (dysfunctional uterine bleeding)   . H pylori ulcer   . H/O rubella   . H/O varicella   . Depression     stopped meds 2012  . History of anxiety   . History of PCOS 03/07/10  . Oligomenorrhea 09/12/10  . Pelvic pain 03/14/11    right sided back  . H/O hematuria 03/14/11  . History of ovarian cyst   . Yeast infection     Past Surgical History  Procedure Date  . Dilation and curettage of uterus 2008  . Cervical cerclage 09/15/2011    Procedure: CERCLAGE CERVICAL;  Surgeon: Kirkland Hun, MD;  Location: [facility];  Service: Gynecology;  Laterality: N/A;  . Cesarean section 10/18/2011   Procedure: CESAREAN SECTION;  Surgeon: [name], MD;  Location: [facility];  Service: Gynecology;  Laterality: N/A;    Family History  Problem Relation Age of Onset  . Diabetes Mother   . Hypertension Mother   . Hyperlipidemia Mother   . Diabetes Father   . Hypertension Father     History  Substance Use Topics  . Smoking status: Never Smoker   . Smokeless tobacco: Never Used  . Alcohol Use: No    Allergies:  Allergies  Allergen Reactions  . Sulfa Antibiotics Swelling    Unknown agent in ear drops    Prescriptions prior to admission  Medication Sig Dispense Refill  . Cholecalciferol (VITAMIN D3) 5000 UNITS CAPS Take 1 capsule by mouth daily.      . Ferrous Sulfate Dried (FEOSOL) 200 (65 FE) MG TABS Take 200 mg by mouth 2 (two) times daily.  60 tablet  12  . ibuprofen (ADVIL,MOTRIN) 600 MG tablet Take 600 mg by mouth as needed. For pain      . Inositol-Folic Acid (PREGNITUDE) 2000-200 MG-MCG PACK Take 2 packets by mouth 2 times daily at 12 noon and 4 pm. Taking 1 pack in a  glass of water in the AM and PM      . metFORMIN (GLUMETZA) 1000 MG (MOD) 24 hr tablet Take 2,000 mg by mouth daily with supper. Taking with the evening meal      . norethindrone-ethinyl estradiol 1/35 (ORTHO-NOVUM 1/35, 28,) tablet Take 1 tablet by mouth daily.  1 Package  11  . Prenatal Vit-Fe Fumarate-FA (PRENATAL MULTIVITAMIN) TABS Take 1 tablet by mouth daily.       Marland Kitchen zolpidem (AMBIEN) 5 MG tablet Take 1 tablet (5 mg total) by mouth at bedtime as needed for sleep (insomnia. May repeat  Ambien 5 mg po prn insomnia x 1.).  30 tablet  0    Review of Systems  Constitutional: Negative.   HENT: Negative.   Eyes: Negative.   Respiratory: Negative.   Cardiovascular: Negative.   Gastrointestinal: Positive for constipation.  Genitourinary: Positive for frequency.  Musculoskeletal: Negative.   Skin: Negative.   Neurological: Negative.   Endo/Heme/Allergies: Negative.   Psychiatric/Behavioral: Negative.     Physical Exam   Blood pressure 120/72, pulse 83, temperature 98.3 F (36.8 C), temperature source Oral, resp. rate 16.  Physical Exam  Constitutional: She is oriented to person, place, and time. She appears well-developed and well-nourished.  HENT:  Head: Normocephalic and atraumatic.  Right Ear: External ear normal.  Left Ear: External ear normal.  Nose: Nose normal.  Eyes: Conjunctivae are normal. Pupils are equal, round, and reactive to light.  Neck: Normal range of motion. Neck supple. No thyromegaly present.  Cardiovascular: Normal rate, regular rhythm and intact distal pulses.   Respiratory: Effort normal and breath sounds normal.  GI: Soft. Bowel sounds are normal. There is no tenderness. There is no rebound and no guarding.  Genitourinary:       Deferred.  Unable to palpate uterine fundus transabdominally.  Musculoskeletal: Normal range of motion.  Neurological: She is alert and oriented to person, place, and time. She has normal reflexes.  Skin: Skin is warm and dry.       Pfannenstiel incision with 2.5cm long x 3mm wide area of superficial skin opening only right lateral portion of incision. Subcutaneous tissue intact. Sm amt serous fluid.  Tissue pink.  No evidence of cellulitis present.  No other open areas noted and incision intact.  No swelling of the area is noted and no discharge from the area with palpation.    Psychiatric: She has a normal mood and affect. Her behavior is normal.       Slightly anxious    MAU Course  Procedures Results for orders placed during the hospital encounter of 11/25/11 (from the past 24 hour(s))  URINALYSIS, ROUTINE W REFLEX MICROSCOPIC     Status: Abnormal   Collection Time   11/25/11  3:15 PM      Component Value Range   Color, Urine STRAW (*) YELLOW   APPearance CLEAR  CLEAR   Specific Gravity, Urine <1.005 (*) 1.005 - 1.030   pH 6.0  5.0 - 8.0   Glucose, UA NEGATIVE  NEGATIVE mg/dL   Hgb urine dipstick SMALL (*) NEGATIVE    Bilirubin Urine NEGATIVE  NEGATIVE   Ketones, ur NEGATIVE  NEGATIVE mg/dL   Protein, ur NEGATIVE  NEGATIVE mg/dL   Urobilinogen, UA 0.2  0.0 - 1.0 mg/dL   Nitrite NEGATIVE  NEGATIVE   Leukocytes, UA NEGATIVE  NEGATIVE  URINE MICROSCOPIC-ADD ON     Status: Abnormal   Collection Time   11/25/11  3:15 PM  Component Value Range   Squamous Epithelial / LPF FEW (*) RARE   Bacteria, UA RARE  RARE     Assessment and Plan  Status post C/S with rt side superficial skin opening without cellulitis. Constipation Type 2 Diabetes Mellitus  Consult with Dr. Stefano Gaul. Discharge to home. Pt and/or family to cleanse area with 50% H202/water twice daily and apply Neosporin ointment and cover area with gauze.  Wound care instructions reviewed with pt and family as well as warning signs/symptoms.   Relief measures for constipation d/w pt and rec: Miralax 1-2xdaily until daily BMs for 3-4 days then Miralax qod.  Rec increased fiber in diet, fiber supplements, increased water and colace daily as well as Mylicon prn.   Reviewed with pt indications for antibiotic use and d/w pt no indications at present for antibiotics.  Javarri Segal O. 11/25/2011, 4:00 PM

## 2011-11-25 NOTE — MAU Note (Signed)
Constipation no bowel movement for 3 days.

## 2011-11-25 NOTE — MAU Note (Signed)
Right side of incision open and looks different with lots of pus, home health has been coming out, yellow.

## 2011-11-29 ENCOUNTER — Ambulatory Visit (INDEPENDENT_AMBULATORY_CARE_PROVIDER_SITE_OTHER): Payer: Managed Care, Other (non HMO) | Admitting: Obstetrics and Gynecology

## 2011-11-29 ENCOUNTER — Encounter: Payer: Self-pay | Admitting: Obstetrics and Gynecology

## 2011-11-29 VITALS — BP 106/62 | Temp 98.2°F | Ht 63.0 in | Wt 213.0 lb

## 2011-11-29 DIAGNOSIS — O900 Disruption of cesarean delivery wound: Secondary | ICD-10-CM

## 2011-11-29 NOTE — Progress Notes (Signed)
Date of delivery: 10/18/2011 Female Name: infant died soon after birth Vaginal delivery:no Cesarean section:yes Tubal ligation:no GDM:no Breast Feeding:no Bottle Feeding:no Post-Partum Blues:yes Abnormal pap:no Normal GU function: yes Normal GI function:yes Returning to work:no   Delivered via c-section 10/18/2011 at 23w 1d by Dr. Su Hilt. Baby died soon after birth.  Subjective:     Kathryn Howard is a 36 y.o. female who presents for a postpartum visit.  I have fully reviewed the prenatal and intrapartum course.    Patient is not sexually active.   The following portions of the patient's history were reviewed and updated as appropriate: allergies, current medications, past family history, past medical history, past social history, past surgical history and problem list.  Review of Systems Pertinent items are noted in HPI.   Objective:    BP 106/62  Temp 98.2 F (36.8 C) (Oral)  Ht 5\' 3"  (1.6 m)  Wt 213 lb (96.616 kg)  BMI 37.73 kg/m2  LMP 11/28/2011  Breastfeeding? No  General:  alert, cooperative and no distress     Lungs: clear to auscultation bilaterally  Heart:  regular rate and rhythm, S1, S2 normal, no murmur  Abdomen: soft, non-tender; bowel sounds normal; no masses,  no organomegaly   Vulva:  normal  Vagina: normal vagina  Cervix:  normal  Corpus: normal size, contour, position, consistency, mobility, non-tender  Adnexa:  normal adnexa             Assessment:  Status post classical cesarean section 4 preterm labor Severely preterm infant with early neonatal death Incision disruption, improving Type 2 diabetes PCO S.   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    HAYGOOD,VANESSA P MD 11/29/2011 8:19 PM

## 2011-12-11 ENCOUNTER — Encounter: Payer: Self-pay | Admitting: Obstetrics and Gynecology

## 2011-12-13 ENCOUNTER — Encounter: Payer: Self-pay | Admitting: Obstetrics and Gynecology

## 2011-12-13 ENCOUNTER — Ambulatory Visit (INDEPENDENT_AMBULATORY_CARE_PROVIDER_SITE_OTHER): Payer: Managed Care, Other (non HMO) | Admitting: Obstetrics and Gynecology

## 2011-12-13 VITALS — BP 114/70 | Temp 98.2°F | Wt 208.0 lb

## 2011-12-13 DIAGNOSIS — Z5189 Encounter for other specified aftercare: Secondary | ICD-10-CM

## 2011-12-13 NOTE — Progress Notes (Signed)
36 YO S/P  C-section with post operative incision separation.  States her incision is doing good-no complaints.  O: Abdomen: Incision-with dry, non-tender, pinpoint separation, without tenderness  A: Incision Check  P: Wound care      Follow-up in 2 weeks  Keaten Mashek, PA-C

## 2011-12-28 ENCOUNTER — Ambulatory Visit (INDEPENDENT_AMBULATORY_CARE_PROVIDER_SITE_OTHER): Payer: Managed Care, Other (non HMO) | Admitting: Obstetrics and Gynecology

## 2011-12-28 ENCOUNTER — Encounter: Payer: Self-pay | Admitting: Obstetrics and Gynecology

## 2011-12-28 VITALS — BP 116/70 | Temp 98.9°F | Wt 216.0 lb

## 2011-12-28 DIAGNOSIS — L7682 Other postprocedural complications of skin and subcutaneous tissue: Secondary | ICD-10-CM

## 2011-12-28 DIAGNOSIS — R209 Unspecified disturbances of skin sensation: Secondary | ICD-10-CM

## 2011-12-28 MED ORDER — LIDO-CAPSAICIN-MEN-METHYL SAL 0.5-0.035-5-20 % EX PTCH: 1.0000 | MEDICATED_PATCH | Freq: Two times a day (BID) | CUTANEOUS | Status: DC

## 2011-12-28 NOTE — Progress Notes (Signed)
36 YO S/P C-section 10/18/11 with non-viable infant developed an incision separation that was pin-point size at 12/13/11 visit.   Today complains of pain burning right side and pinching sensation of left for the past week. Has begun to walk for exercise (daily x 30 minutes).  Denies any incisional drainage, redness or signs of infection.  O: Abdomen: Soft, incision intact without evidence of infection,  tenderness along superior border of incision, no evidence of abdominal wall defect with sit up/palpation maneuver   A: Incisional Pain (nerve healing)  P: Lidocaine Patches #5  apply to affected area every 12 hours 2 refills      RTO-AEx in October  Kathryn Whitehair, PA-C

## 2012-01-04 ENCOUNTER — Telehealth: Payer: Self-pay

## 2012-01-04 ENCOUNTER — Encounter: Payer: Self-pay | Admitting: Obstetrics and Gynecology

## 2012-01-04 ENCOUNTER — Ambulatory Visit (INDEPENDENT_AMBULATORY_CARE_PROVIDER_SITE_OTHER): Payer: Managed Care, Other (non HMO) | Admitting: Obstetrics and Gynecology

## 2012-01-04 VITALS — BP 110/64 | Temp 98.6°F | Wt 215.0 lb

## 2012-01-04 DIAGNOSIS — T8189XA Other complications of procedures, not elsewhere classified, initial encounter: Secondary | ICD-10-CM

## 2012-01-04 DIAGNOSIS — IMO0002 Reserved for concepts with insufficient information to code with codable children: Secondary | ICD-10-CM

## 2012-01-04 NOTE — Telephone Encounter (Signed)
Tc from pt. Pt c/o an open incision s/p c-section in 10/2011. No odor or d/c from incision. No fever. Appt sched today @11 :15 with ep. Pt voices understanding.

## 2012-01-04 NOTE — Progress Notes (Signed)
36 YO seen 12/28/11 for incisional pain, is S/P C-section 11/16/2011 (infant died) returns today stating that incision has opened. States that last week incision opened the day after office visit.  States that a lot of blood came out initially but none since accompanied by burning sensation.  Denies fever, increased pain, or signs of pus.   O: Abdomen: right edge of incision with superficial skin separation without evidence of infection or drainage (dry/clean gauze bandage)   A:  Incision Check      S/P C-section 16-Nov-2011 (fetal demise)  P: Patient concerned that this area of separation has occurred 3 times and wants to know what to do-will consult Dr. Su Hilt      Keep incision clean and dry     RTO-as scheduled or prn  Lillia Lengel, PA-C

## 2012-01-05 ENCOUNTER — Encounter: Payer: Managed Care, Other (non HMO) | Admitting: Obstetrics and Gynecology

## 2012-06-24 ENCOUNTER — Ambulatory Visit: Payer: Managed Care, Other (non HMO) | Admitting: Obstetrics and Gynecology

## 2012-06-24 ENCOUNTER — Encounter: Payer: Self-pay | Admitting: Obstetrics and Gynecology

## 2012-06-24 VITALS — BP 120/70 | HR 86 | Ht 63.0 in | Wt 223.0 lb

## 2012-06-24 DIAGNOSIS — B373 Candidiasis of vulva and vagina: Secondary | ICD-10-CM

## 2012-06-24 DIAGNOSIS — R5383 Other fatigue: Secondary | ICD-10-CM

## 2012-06-24 DIAGNOSIS — Z01419 Encounter for gynecological examination (general) (routine) without abnormal findings: Secondary | ICD-10-CM

## 2012-06-24 DIAGNOSIS — R102 Pelvic and perineal pain: Secondary | ICD-10-CM

## 2012-06-24 DIAGNOSIS — N898 Other specified noninflammatory disorders of vagina: Secondary | ICD-10-CM

## 2012-06-24 DIAGNOSIS — E119 Type 2 diabetes mellitus without complications: Secondary | ICD-10-CM

## 2012-06-24 DIAGNOSIS — R300 Dysuria: Secondary | ICD-10-CM

## 2012-06-24 LAB — T3, FREE: T3, Free: 2.6 pg/mL (ref 2.3–4.2)

## 2012-06-24 LAB — POCT URINALYSIS DIPSTICK
Bilirubin, UA: NEGATIVE
Ketones, UA: NEGATIVE
Leukocytes, UA: NEGATIVE
Spec Grav, UA: 1.02
pH, UA: 6

## 2012-06-24 LAB — POCT WET PREP (WET MOUNT): pH: 4.5

## 2012-06-24 LAB — CBC
HCT: 32.3 % — ABNORMAL LOW (ref 36.0–46.0)
Hemoglobin: 10.7 g/dL — ABNORMAL LOW (ref 12.0–15.0)
RBC: 4.24 MIL/uL (ref 3.87–5.11)

## 2012-06-24 LAB — TSH: TSH: 3.488 u[IU]/mL (ref 0.350–4.500)

## 2012-06-24 LAB — POCT OSOM BVBLUE TEST: Bacterial Vaginosis: NEGATIVE

## 2012-06-24 MED ORDER — TERCONAZOLE 0.4 % VA CREA: TOPICAL_CREAM | VAGINAL | Status: DC

## 2012-06-24 NOTE — Progress Notes (Signed)
Subjective:  Last Pap: 01/2011 per pt  WNL: Yes with nl HR HPV.  Next due 01/2014 Regular Periods:yes Contraception: pill   Monthly Breast exam:no Tetanus<43yrs:yes Nl.Bladder Function:no  Daily BMs:yes Healthy Diet:yes Calcium:no Mammogram:no Date of Mammogram: n/a Exercise:no Have often Exercise: n/a Seatbelt: yes Abuse at home: no Stressful work:yes Sigmoid-colonoscopy: n/a Bone Density: No PCP: [facility]  Change in PMH: no changes Change in [facility]: no changes  Kathryn Howard is a 37 y.o. female, G2P0110, who presents for an annual exam. Frequency    History   Social History  . Marital Status: Married    Spouse Name: N/A    Number of Children: N/A  . Years of Education: N/A   Social History Main Topics  . Smoking status: Never Smoker   . Smokeless tobacco: Never Used  . Alcohol Use: No  . Drug Use: No  . Sexually Active: None     Comment: nortrel   Other Topics Concern  . None   Social History Narrative  . None    Menstrual cycle:   LMP: Patient's last menstrual period was 06/10/2012.           Cycle: REGULAR on BCPs  The following portions of the patient's history were reviewed and updated as appropriate: allergies, current medications, past family history, past medical history, past social history, past surgical history and problem list.  Review of Systems Pertinent items are noted in HPI. Breast:Negative for breast lump,nipple discharge or nipple retraction Gastrointestinal: Negative for abdominal pain, change in bowel habits or rectal bleeding Urinary:negative   Objective:    BP 120/70  Pulse 86  Ht 5\' 3"  (1.6 m)  Wt 223 lb (101.152 kg)  BMI 39.51 kg/m2  LMP 06/10/2012  Breastfeeding? No    Weight:  Wt Readings from Last 1 Encounters:  06/24/12 223 lb (101.152 kg)          BMI: Body mass index is 39.51 kg/(m^2).  General Appearance: Alert, appropriate appearance for age. No acute distress HEENT: Grossly normal Neck / Thyroid:  Supple, no masses, nodes or enlargement Lungs: clear to auscultation bilaterally Back: No CVA tenderness Breast Exam: No masses or nodes.No dimpling, nipple retraction or discharge. Cardiovascular: Regular rate and rhythm. S1, S2, no murmur Gastrointestinal: Soft, non-tender, no masses or organomegaly Pelvic Exam: External genitalia: normal general appearance Vaginal: normal rugae and discharge, white Cervix: normal appearance Adnexa: difficult to palpate , but no masses note Uterus: difficult to palpate size Exam limited by body habitus Rectovaginal: normal rectal, no masses Lymphatic Exam: Non-palpable nodes in neck, clavicular, axillary, or inguinal regions Skin: no rash or abnormalities Neurologic: Normal gait and speech, no tremor  Psychiatric: Alert and oriented, appropriate affect.   Wet Prep:positive hyphae Urinalysis:neg UPT: Not done   Assessment:    PCOS  Type II DM without good control Fatigue r/o thyroid dz Infertility Incompetent cervix Pelvic pain Monilia    Plan:    pap smear additional lab tests per orders U/S nv Terazol now and after next menses return annually or prn Contraception:oral contraceptives (estrogen/progesterone)   Dierdre Forth MD

## 2012-06-25 LAB — VITAMIN D 25 HYDROXY (VIT D DEFICIENCY, FRACTURES): Vit D, 25-Hydroxy: 38 ng/mL (ref 30–89)

## 2012-06-27 ENCOUNTER — Telehealth: Payer: Self-pay | Admitting: Obstetrics and Gynecology

## 2012-06-27 NOTE — Telephone Encounter (Signed)
TC to pt. Who is requesting results. Informed waiting to be reviewed by DR VPH.  Call 07/01/12 if not contacted sooner.

## 2012-07-29 ENCOUNTER — Encounter (HOSPITAL_COMMUNITY): Payer: Self-pay | Admitting: Obstetrics and Gynecology

## 2012-07-30 ENCOUNTER — Encounter (HOSPITAL_COMMUNITY): Payer: Self-pay | Admitting: Obstetrics and Gynecology

## 2012-08-07 ENCOUNTER — Other Ambulatory Visit: Payer: Self-pay

## 2012-08-27 ENCOUNTER — Ambulatory Visit (HOSPITAL_COMMUNITY)
Admission: RE | Admit: 2012-08-27 | Discharge: 2012-08-27 | Disposition: A | Discharge status: Home / Self Care | Payer: Managed Care, Other (non HMO) | Source: Ambulatory Visit | Attending: Obstetrics and Gynecology | Admitting: Obstetrics and Gynecology

## 2012-08-27 DIAGNOSIS — E119 Type 2 diabetes mellitus without complications: Secondary | ICD-10-CM

## 2012-08-27 DIAGNOSIS — O09219 Supervision of pregnancy with history of pre-term labor, unspecified trimester: Secondary | ICD-10-CM

## 2012-08-27 DIAGNOSIS — Z98891 History of uterine scar from previous surgery: Secondary | ICD-10-CM

## 2012-08-27 DIAGNOSIS — O3430 Maternal care for cervical incompetence, unspecified trimester: Secondary | ICD-10-CM

## 2012-08-27 NOTE — Consult Note (Signed)
MFM consult   37 yr old G3P0120 referred by Dr. Pennie Rushing for preconception counseling for history of preterm delivery.   Past Ob hx: 2008- first trimester miscarriage 2010- conceived on clomid; reports was 3-4 months and fetus was found to have no heart beat; had D&C 2013- natural conception; had vaginal bleeding at 18 weeks and was found to be dilated; no symptoms; had exam indicated cerclage placed and remained in the hospital; went in to preterm labor at 23 weeks and had a classical C section; infant died  Past gyn hx: PCOS; had 2 failed IVF cycles  PMH: type II diabetes- currently is on Victoza; reports last hgbA1C was 5.5%  Family history: mother and sister with miscarriages and preterm deliveries  I counseled the patient as follows:  1. First trimester miscarriages: - discussed that as the number of miscarriages a women has increases it increases the risk of a subsequent miscarriage; however most subsequent pregnancies are successful - discussed unknown etiology of these miscarriages - discussed that poorly controlled diabetes is a known risk factor for miscarriage- patient reports her diabetes was not well controlled at the time of these 2 pregnancies - discussed other possible etiologies include: aneuploidy, fetal anomalies, or other complications  2. History of cervical insufficiency: I discussed that with a history of preterm delivery the risk is increased in future pregnancies. Studies have shown that the frequency of recurrent preterm birth was 14 to 22 % after one preterm delivery, 28 to 42 % after two preterm deliveries, and 67% after three preterm deliveries. Term births decreased the risk of preterm birth in subsequent pregnancies. I also explained to the patient that given her history it is possible that she has cervical insufficiency although I discussed the difficulties in making this diagnosis. Her history of painless cervical dilation is consistent with cervical  insufficiency. I discussed there are several options for management in this pregnancy to help decrease the risk of preterm birth. I discussed the option of prophylactic cervical cerclage in women with prior pregnancies complicated by early loss or preterm delivery. Given patient had painless cervical dilation with her pregnancy I feel she may benefit from cerclage. I discussed risks of cerclage include: bleeding, infection, risk of pregnancy loss, and risk of cerclage failure.   I discussed another option is weekly injections of 17 OH progesterone as use from 16-36 weeks has been shown to decrease the risk of recurrent preterm birth by up to 40%. I discussed we do not know of any adverse effects of progesterone however long term data is limited.  Currently there is no data to support the use of both cerclage and progesterone.  A third option is serial cervical length surveillance with intervention if cervical shortening is found.   I discussed given her history I would highly recommend consideration of either progesterone or cerclage. Again given patient's history I feel it is consistent with cervical insufficiency and would recommend a prophylactic cerclage placed around [redacted] weeks gestation. I would recommend cervical length surveillance with transvaginal ultrasound every 2 weeks (or more frequently as needed) from 16-24-[redacted] weeks gestation. If cervical shortening was found vaginal progesterone can be offered.  Patient was given brochure from [facility]- a support group for parents who have lost a baby in pregnancy or within the first year of life.  3. Previous classical C section: - discussed risk of uterine rupture with labor of about 8% - recommend delivery by repeat C section at [redacted] weeks gestation or sooner if labor develops  4. Type II diabetes: Discussed increased risks in pregnancy include: fetal macrosomia, shoulder dystocia, and increased risk of requiring a Cesarean delivery. There is  also an increased risk of developing preeclampsia during the pregnancy. There is an increased risk of congenital malformations related to level of diabetic control in the first trimester. I discussed there is an increased risk of stillbirth, neonatal hypoglycemia, neonatal jaundice, and neonatal electrolyte disturbances. I recommend strict glucose control maintaining fasting blood sugars <90 and 2 hour postprandial values <120. Patient is currently on Victoza- discussed limited data in pregnancy and would recommend diabetic control with either insulin, metformin, or glyburide. I recommend starting fetal kick counts- at [redacted] weeks gestation. I recommend starting antenatal testing with either weekly biophysical profiles or twice weekly nonstress tests and weekly amniotic fluid index starting at 32 weeks.  I recommend following fetal growth every 4 weeks starting at [redacted] weeks gestation. I recommend obtain baseline TSH, CBC, AST, ALT, BUN, creatinine, uric acid, 24 hour urine total protein, and EKG. The patient should have an Ophthamology exam if not recently performed. Recommend check hemoglobin A1C every trimester.  Given the increased risk of congenital anomalies recommend fetal echocardiogram at [redacted] weeks gestation. Discussed importance of conceiving with good diabetic control to lower the risk of miscarriage and fetal anomalies. As mentioned patient's diabetes is well controlled currently- recent hgbA1C was 5.5%. Would recommend keeping hgbA1C <6%.  5. Advanced maternal age: - discussed increased risk of fetal aneuploidy - discussed options of screening tests: first trimester screen, quad screen, and cell free fetal DNA and the limitations of each in detecting fetal aneuploidy - discussed options of invasive definitive testing: CVS and amniocentesis; and the risks involved - would recommend genetic counseling referral early in pregnancy to discuss above options in detail  I spent 60 minutes in face  to face consultation with the patient.  Eulis Foster, MD

## 2012-12-15 IMAGING — US US OB FOLLOW-UP
1 series · 12 of 28 positions shown · non-contrast
Comparison: none

[Series 1: us ob follow-up · 0.18mm/px · 12 of 61 slices shown]
[im 3/61]
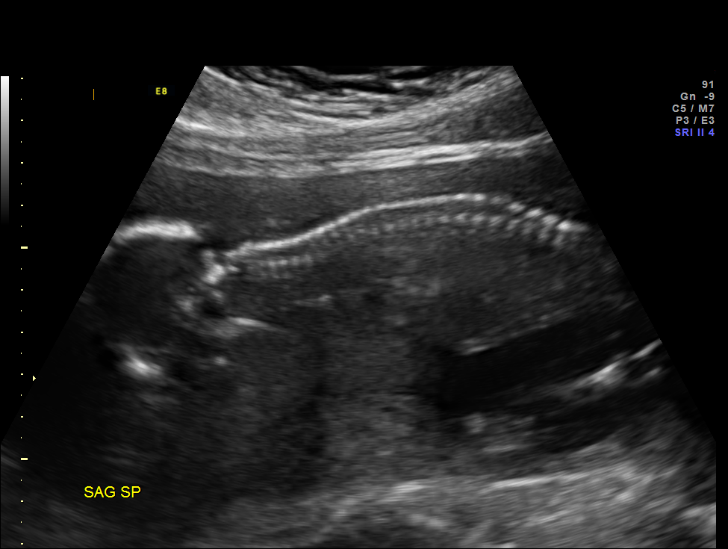
[im 7/61]
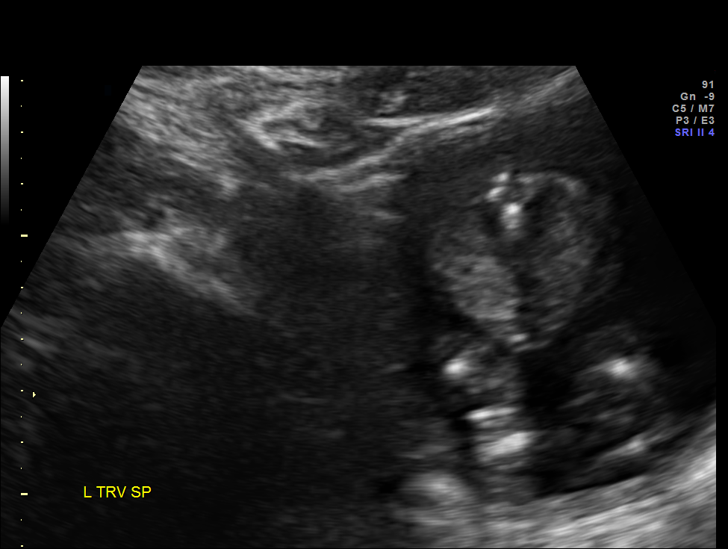
[im 12/61]
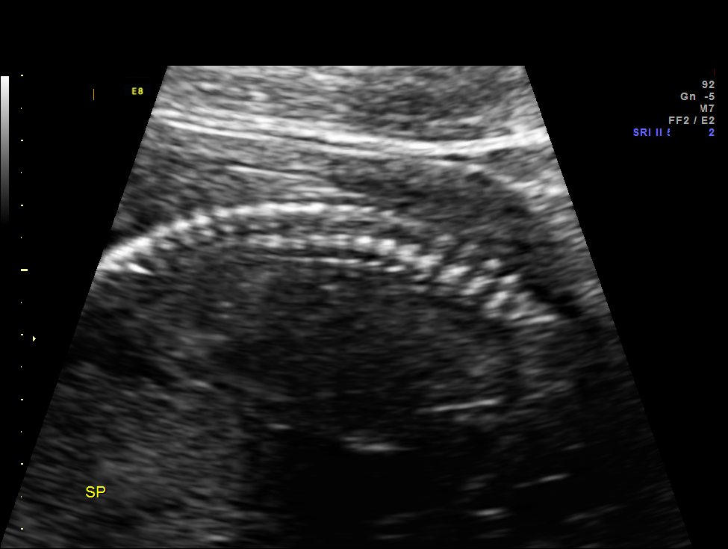
[im 18/61]
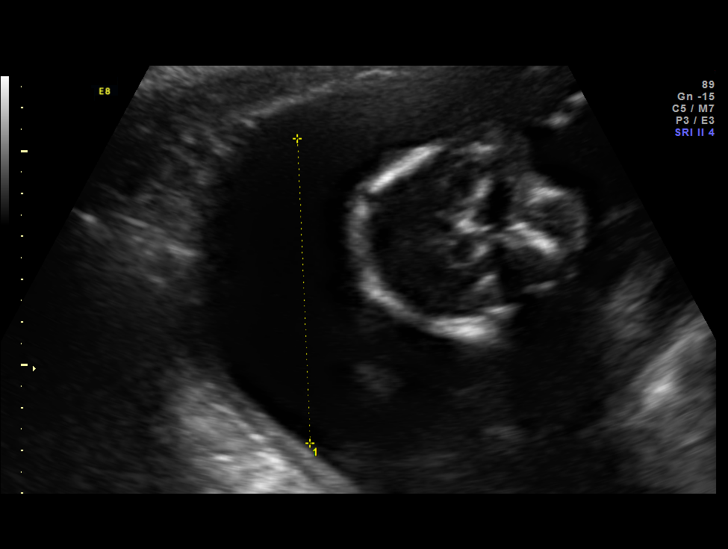
[im 23/61]
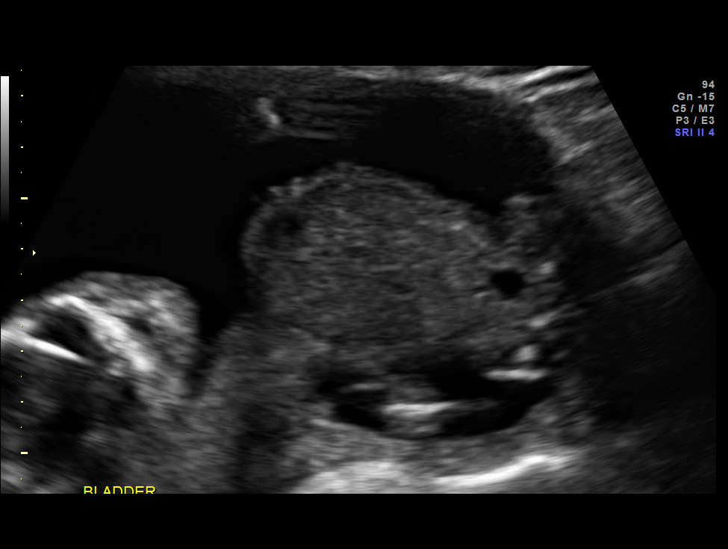
[im 27/61]
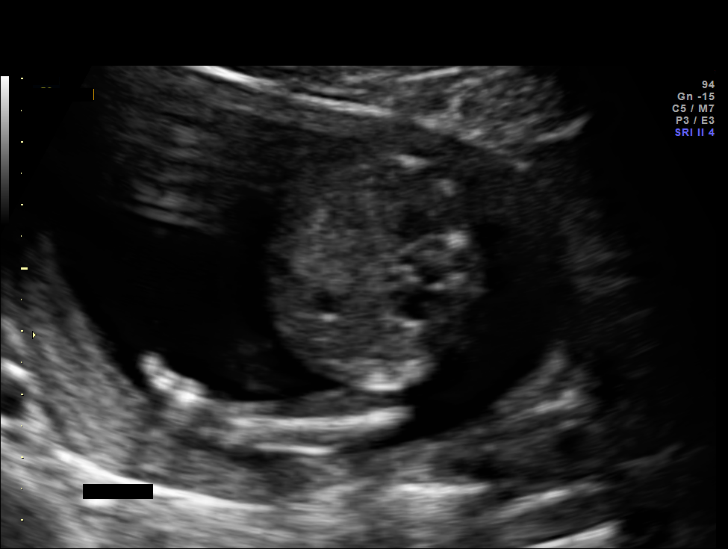
[im 34/61]
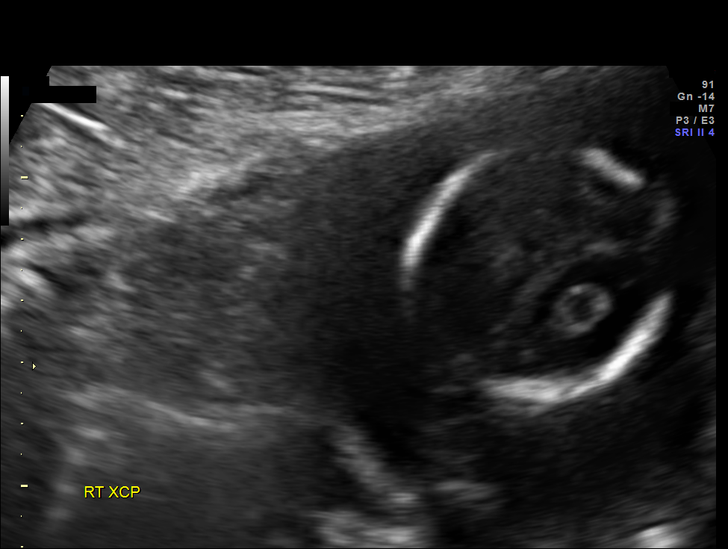
[im 38/61]
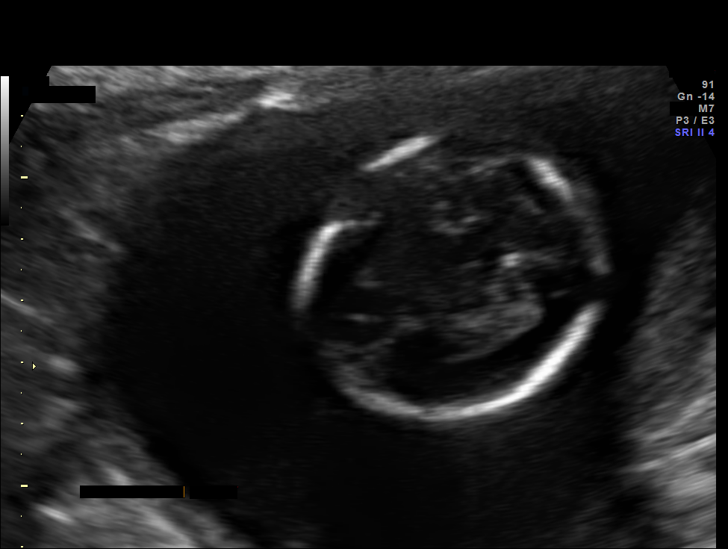
[im 43/61]
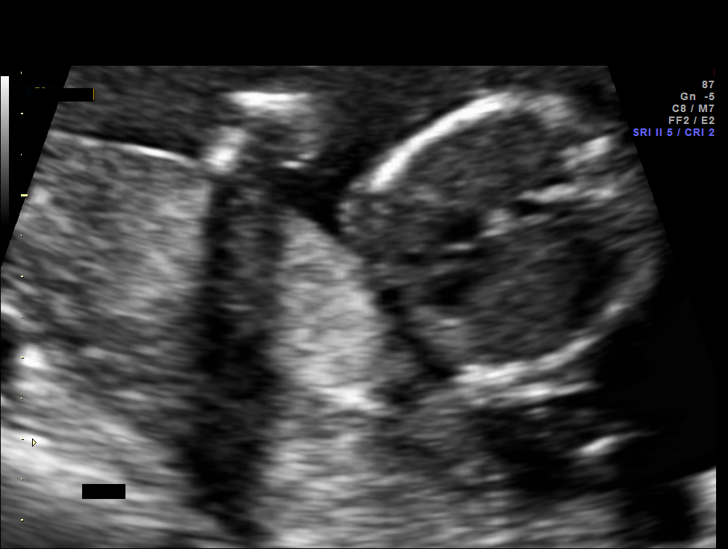
[im 49/61]
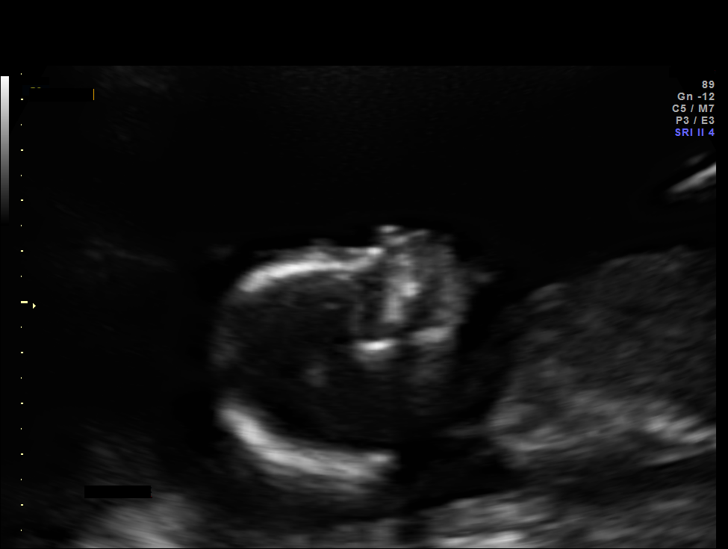
[im 54/61]
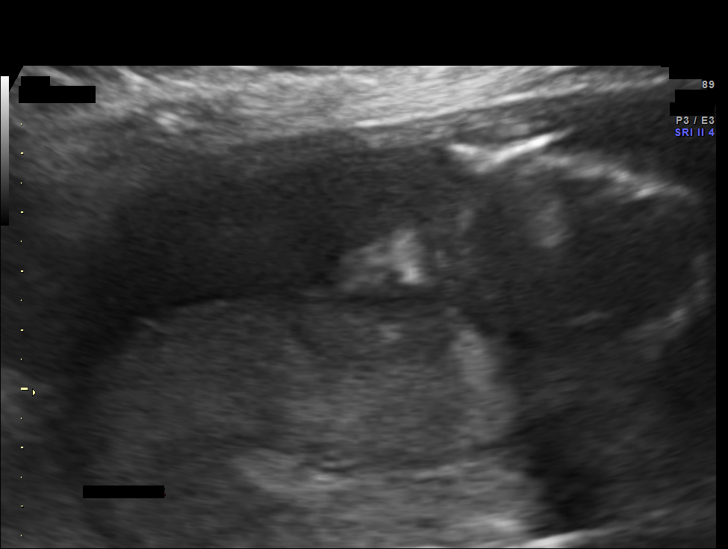
[im 58/61]
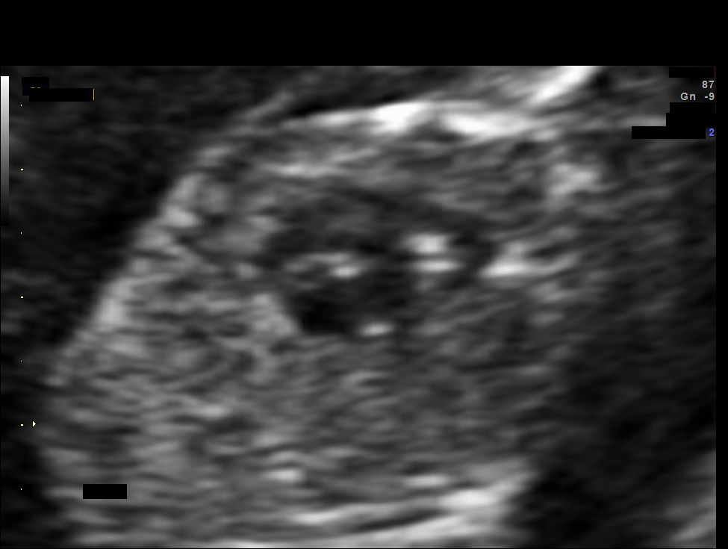

[12 of 28 positions shown; findings below may reference images not displayed]

OBSTETRICS REPORT
                      (Signed Final 09/14/2011 [DATE])

 Order#:         [id number]
Procedures

 US OB FOLLOW UP                                       76816.1
Indications

 Vaginal bleeding, unknown etiology
 Bulging membranes
 Follow-up incomplete fetal anatomic evaluation
Fetal Evaluation

 Cardiac Activity:  Observed
 Presentation:      Breech
 Placenta:          Posterior, above cervical
                    os
 P. Cord            Not well visualized
 Insertion:

 Amniotic Fluid
 AFI FV:      Subjectively within normal limits
                                             Larg Pckt:     7.1  cm
Gestational Age

 Clinical EDD:  18w 3d                                        EDD:   02/12/12
 Best:          18w 3d     Det. By:  Clinical EDD             EDD:   02/12/12
Anatomy

 Cranium:           Previously seen     Aortic Arch:       Appears normal
 Fetal Cavum:       Previously seen     Ductal Arch:       Not well
                                                           visualized
 Ventricles:        Appears normal      Diaphragm:         Appears normal
 Choroid Plexus:    Right choroid       Stomach:           Appears
                    plexus cyst,
                    3.7  mm
                                                           normal, left
                                                           sided
 Cerebellum:        Appears normal      Abdomen:           Appears normal
 Posterior Fossa:   Appears normal      Abdominal Wall:    Appears nml
                                                           (cord insert,
                                                           abd wall)
 Nuchal Fold:       Not well            Cord Vessels:      Appears normal
                    visualized                             (3 vessel cord)
 Face:              Appears normal      Kidneys:           Appear normal
                    (lips/profile/orbit
                    s)
 Heart:             Not well            Bladder:           Appears normal
                    visualized
 RVOT:              Appears normal      Spine:             Appears normal
 LVOT:              Not well            Limbs:             Previously seen
                    visualized

 Other:     Male gender. Heels previously seen.
Comments

 The patient's fetal anatomic survey is not complete due to
 maternal body habitus and fetal position.  Choroid plexus
 cysts were noted.  This is a common variant (1-2%) of all
 pregnancies, and carries a small association with Trisomy 18.
 However, there were no other ultrasound stigmata of Trisomy
 18, making the possibility of Trisomy 18 less likely.   Although
 cardiac anatomy remains incompletely visualized, no gross
 fetal anomalies or soft markers of aneuploidy were identified.
 There is nothing strongly indicating the need for karyotype on
 today's ultrasound.  However, if amniocentesis is performed
 to rule out infection in preparation for cerclage placement
 sending extra fluid for karyotype would be reasonable.  A
 follow-up ultrasound will be performed in 4 weeks to reassess
 fetal growth and anatomy.

 Advanced cervical dilation and passage of membranes
 through the cervical canal to the level of the external os and
 possibly into the vagina is again seen on this ultrasound.
Impression

 Single living intrauterine pregnancy at 18 weeks 3 days.
 Normal amniotic fluid volume.
 Unilateral choroid plexus cysts.
 No gross fetal anomalies identified.
Recommendations

 Recommend follow-up ultrasound examination in 4 weeks to
 reassess fetal anatomy.

 questions or concerns.
                Isa, Xiomy

## 2013-05-03 ENCOUNTER — Emergency Department (HOSPITAL_COMMUNITY)
Admission: EM | Admit: 2013-05-03 | Discharge: 2013-05-03 | Disposition: A | Discharge status: Home / Self Care | Payer: BC Managed Care – PPO | Attending: Emergency Medicine | Admitting: Emergency Medicine

## 2013-05-03 ENCOUNTER — Encounter (HOSPITAL_COMMUNITY): Payer: Self-pay | Admitting: Emergency Medicine

## 2013-05-03 ENCOUNTER — Emergency Department (HOSPITAL_COMMUNITY): Payer: BC Managed Care – PPO

## 2013-05-03 DIAGNOSIS — Z79899 Other long term (current) drug therapy: Secondary | ICD-10-CM | POA: Insufficient documentation

## 2013-05-03 DIAGNOSIS — R109 Unspecified abdominal pain: Secondary | ICD-10-CM | POA: Insufficient documentation

## 2013-05-03 DIAGNOSIS — E559 Vitamin D deficiency, unspecified: Secondary | ICD-10-CM | POA: Insufficient documentation

## 2013-05-03 DIAGNOSIS — R5381 Other malaise: Secondary | ICD-10-CM | POA: Insufficient documentation

## 2013-05-03 DIAGNOSIS — Z87440 Personal history of urinary (tract) infections: Secondary | ICD-10-CM | POA: Insufficient documentation

## 2013-05-03 DIAGNOSIS — R300 Dysuria: Secondary | ICD-10-CM | POA: Insufficient documentation

## 2013-05-03 DIAGNOSIS — Z8719 Personal history of other diseases of the digestive system: Secondary | ICD-10-CM | POA: Insufficient documentation

## 2013-05-03 DIAGNOSIS — Z8659 Personal history of other mental and behavioral disorders: Secondary | ICD-10-CM | POA: Insufficient documentation

## 2013-05-03 DIAGNOSIS — E669 Obesity, unspecified: Secondary | ICD-10-CM | POA: Insufficient documentation

## 2013-05-03 DIAGNOSIS — R42 Dizziness and giddiness: Secondary | ICD-10-CM | POA: Insufficient documentation

## 2013-05-03 DIAGNOSIS — E119 Type 2 diabetes mellitus without complications: Secondary | ICD-10-CM | POA: Insufficient documentation

## 2013-05-03 DIAGNOSIS — Z3202 Encounter for pregnancy test, result negative: Secondary | ICD-10-CM | POA: Insufficient documentation

## 2013-05-03 DIAGNOSIS — R319 Hematuria, unspecified: Secondary | ICD-10-CM | POA: Insufficient documentation

## 2013-05-03 DIAGNOSIS — Z8711 Personal history of peptic ulcer disease: Secondary | ICD-10-CM | POA: Insufficient documentation

## 2013-05-03 DIAGNOSIS — Z8742 Personal history of other diseases of the female genital tract: Secondary | ICD-10-CM | POA: Insufficient documentation

## 2013-05-03 DIAGNOSIS — R5383 Other fatigue: Secondary | ICD-10-CM

## 2013-05-03 DIAGNOSIS — Z8619 Personal history of other infectious and parasitic diseases: Secondary | ICD-10-CM | POA: Insufficient documentation

## 2013-05-03 LAB — POCT I-STAT, CHEM 8
BUN: 4 mg/dL — AB (ref 6–23)
CALCIUM ION: 1.26 mmol/L — AB (ref 1.12–1.23)
CHLORIDE: 102 meq/L (ref 96–112)
Creatinine, Ser: 0.6 mg/dL (ref 0.50–1.10)
Glucose, Bld: 134 mg/dL — ABNORMAL HIGH (ref 70–99)
HCT: 36 % (ref 36.0–46.0)
Hemoglobin: 12.2 g/dL (ref 12.0–15.0)
Potassium: 3.9 mEq/L (ref 3.7–5.3)
Sodium: 139 mEq/L (ref 137–147)
TCO2: 25 mmol/L (ref 0–100)

## 2013-05-03 LAB — CBC WITH DIFFERENTIAL/PLATELET
BASOS PCT: 0 % (ref 0–1)
Basophils Absolute: 0 10*3/uL (ref 0.0–0.1)
EOS ABS: 0.1 10*3/uL (ref 0.0–0.7)
Eosinophils Relative: 2 % (ref 0–5)
HEMATOCRIT: 34.3 % — AB (ref 36.0–46.0)
HEMOGLOBIN: 11.3 g/dL — AB (ref 12.0–15.0)
LYMPHS ABS: 2.9 10*3/uL (ref 0.7–4.0)
Lymphocytes Relative: 34 % (ref 12–46)
MCH: 25.9 pg — AB (ref 26.0–34.0)
MCHC: 32.9 g/dL (ref 30.0–36.0)
MCV: 78.5 fL (ref 78.0–100.0)
MONO ABS: 0.4 10*3/uL (ref 0.1–1.0)
MONOS PCT: 5 % (ref 3–12)
Neutro Abs: 5 10*3/uL (ref 1.7–7.7)
Neutrophils Relative %: 59 % (ref 43–77)
Platelets: 284 10*3/uL (ref 150–400)
RBC: 4.37 MIL/uL (ref 3.87–5.11)
RDW: 14.3 % (ref 11.5–15.5)
WBC: 8.5 10*3/uL (ref 4.0–10.5)

## 2013-05-03 LAB — URINALYSIS, ROUTINE W REFLEX MICROSCOPIC
BILIRUBIN URINE: NEGATIVE
GLUCOSE, UA: 100 mg/dL — AB
KETONES UR: NEGATIVE mg/dL
Leukocytes, UA: NEGATIVE
Nitrite: NEGATIVE
PH: 6.5 (ref 5.0–8.0)
Protein, ur: NEGATIVE mg/dL
SPECIFIC GRAVITY, URINE: 1.011 (ref 1.005–1.030)
Urobilinogen, UA: 0.2 mg/dL (ref 0.0–1.0)

## 2013-05-03 LAB — URINE MICROSCOPIC-ADD ON

## 2013-05-03 LAB — PREGNANCY, URINE: Preg Test, Ur: NEGATIVE

## 2013-05-03 MED ORDER — OXYCODONE-ACETAMINOPHEN 5-325 MG PO TABS
1.0000 | ORAL_TABLET | Freq: Once | ORAL | Status: AC
  Administered 2013-05-03: 1 via ORAL
  Filled 2013-05-03: qty 1

## 2013-05-03 MED ORDER — HYDROCODONE-ACETAMINOPHEN 5-325 MG PO TABS: 1.0000 | ORAL_TABLET | Freq: Four times a day (QID) | ORAL | Status: DC | PRN

## 2013-05-03 MED ORDER — PHENAZOPYRIDINE HCL 200 MG PO TABS: 200.0000 mg | ORAL_TABLET | Freq: Three times a day (TID) | ORAL | Status: DC

## 2013-05-03 MED ORDER — CIPROFLOXACIN HCL 500 MG PO TABS: 500.0000 mg | ORAL_TABLET | Freq: Two times a day (BID) | ORAL | Status: DC

## 2013-05-03 NOTE — ED Notes (Signed)
Reports having recent UTI and treated by ucc, still having pain with urination and right flank pain.

## 2013-05-03 NOTE — ED Notes (Signed)
Patient returned from CT

## 2013-05-03 NOTE — ED Provider Notes (Addendum)
CSN: [id number]     Arrival date & time 05/03/13  1152 History   First MD Initiated Contact with Patient 05/03/13 1205     Chief Complaint  Patient presents with  . Flank Pain   (Consider location/radiation/quality/duration/timing/severity/associated sxs/prior Treatment) Patient is a 38 y.o. female presenting with flank pain. The history is provided by the patient.  Flank Pain This is a recurrent (seen appx 1 month ago for UTI and given amoxicillin and sx returned 2 weeks later and have worsened with right flank pain and dysuria) problem. Episode onset: 2 weeks. The problem occurs constantly. The problem has been gradually worsening. Associated symptoms include abdominal pain. Pertinent negatives include no chest pain, no headaches and no shortness of breath. Associated symptoms comments: No fever, nausea or vomiting.  However since sx started has had worsening dysuria, lightheadedness and general fatigue. Exacerbated by: urinating. Nothing relieves the symptoms. She has tried nothing for the symptoms. The treatment provided no relief.    Past Medical History  Diagnosis Date  . Vitamin D deficiency   . Obesity   . Gastroparesis   . Diabetes mellitus   . Acid reflux   . Fatty liver   . DUB (dysfunctional uterine bleeding)   . H pylori ulcer   . H/O rubella   . H/O varicella   . Depression     stopped meds 2012  . History of anxiety   . History of PCOS 03/07/10  . Oligomenorrhea 09/12/10  . Pelvic pain 03/14/11    right sided back  . H/O hematuria 03/14/11  . History of ovarian cyst   . Yeast infection    Past Surgical History  Procedure Laterality Date  . Dilation and curettage of uterus  2008  . Cervical cerclage  09/15/2011    Procedure: CERCLAGE CERVICAL;  Surgeon: Kirkland Hun, MD;  Location: [facility];  Service: Gynecology;  Laterality: N/A;  . Cesarean section  10/18/2011    Procedure: CESAREAN SECTION;  Surgeon: [name], MD;  Location: [facility];  Service: Gynecology;   Laterality: N/A;   Family History  Problem Relation Age of Onset  . Diabetes Mother   . Hypertension Mother   . Hyperlipidemia Mother   . Diabetes Father   . Hypertension Father    History  Substance Use Topics  . Smoking status: Never Smoker   . Smokeless tobacco: Never Used  . Alcohol Use: No   OB History   Grav Para Term Preterm Abortions TAB SAB Ect Mult Living   2 1 0 1 1 0 1 0 0 0      Review of Systems  Constitutional: Positive for fatigue. Negative for fever.  Respiratory: Negative for shortness of breath.   Cardiovascular: Negative for chest pain.  Gastrointestinal: Positive for abdominal pain. Negative for nausea and vomiting.  Genitourinary: Positive for dysuria, hematuria and flank pain.  Neurological: Negative for headaches.  All other systems reviewed and are negative.    Allergies  Sulfa antibiotics  Home Medications   Current Outpatient Rx  Name  Route  Sig  Dispense  Refill  . Cholecalciferol (VITAMIN D3) 5000 UNITS CAPS   Oral   Take 1 capsule by mouth daily.         . Ferrous Sulfate Dried (FEOSOL) 200 (65 FE) MG TABS   Oral   Take 200 mg by mouth 2 (two) times daily.   60 tablet   12   . ibuprofen (ADVIL,MOTRIN) 600 MG tablet   Oral  Take 600 mg by mouth as needed. For pain         . Inositol-Folic Acid (PREGNITUDE) 2000-200 MG-MCG PACK   Oral   Take 2 packets by mouth 2 times daily at 12 noon and 4 pm. Taking 1 pack in a glass of water in the AM and PM         . Lido-Capsaicin-Men-Methyl Sal 0.5-0.035-5-20 % PTCH   Apply externally   Apply 1 patch topically every 12 (twelve) hours.   5 patch   3   . Liraglutide (VICTOZA [place])   Subcutaneous   Inject into the skin.         . metFORMIN (GLUMETZA) 1000 MG (MOD) 24 hr tablet   Oral   Take 2,000 mg by mouth daily with supper. Taking with the evening meal         . EXPIRED: norethindrone-ethinyl estradiol 1/35 (ORTHO-NOVUM 1/35, 28,) tablet   Oral   Take 1 tablet by  mouth daily.   1 Package   11   . Prenatal Vit-Fe Fumarate-FA (PRENATAL MULTIVITAMIN) TABS   Oral   Take 1 tablet by mouth daily.          Marland Kitchen. terconazole (TERAZOL 7) 0.4 % vaginal cream      Pt to insert 1 applicator full per vagina qhs x 7days   45 g   1   . EXPIRED: zolpidem (AMBIEN) 5 MG tablet   Oral   Take 1 tablet (5 mg total) by mouth at bedtime as needed for sleep (insomnia. May repeat  Ambien 5 mg po prn insomnia x 1.).   30 tablet   0    BP 120/82  Pulse 80  Temp(Src) 98.5 F (36.9 C) (Oral)  Resp 18  SpO2 100%  LMP 04/26/2013 Physical Exam  Nursing note and vitals reviewed. Constitutional: She is oriented to person, place, and time. She appears well-developed and well-nourished. No distress.  HENT:  Head: Normocephalic and atraumatic.  Mouth/Throat: Oropharynx is clear and moist.  Eyes: Conjunctivae and EOM are normal. Pupils are equal, round, and reactive to light.  Neck: Normal range of motion. Neck supple.  Cardiovascular: Normal rate, regular rhythm and intact distal pulses.   No murmur heard. Pulmonary/Chest: Effort normal and breath sounds normal. No respiratory distress. She has no wheezes. She has no rales.  Abdominal: Soft. She exhibits no distension. There is tenderness in the suprapubic area. There is CVA tenderness. There is no rebound and no guarding.  Significant right flank pain  Musculoskeletal: Normal range of motion. She exhibits no edema and no tenderness.  Neurological: She is alert and oriented to person, place, and time.  Skin: Skin is warm and dry. No rash noted. No erythema.  Psychiatric: She has a normal mood and affect. Her behavior is normal.    ED Course  Procedures (including critical care time) Labs Review Labs Reviewed  URINALYSIS, ROUTINE W REFLEX MICROSCOPIC - Abnormal; Notable for the following:    Glucose, UA 100 (*)    Hgb urine dipstick MODERATE (*)    All other components within normal limits  CBC WITH  DIFFERENTIAL - Abnormal; Notable for the following:    Hemoglobin 11.3 (*)    HCT 34.3 (*)    MCH 25.9 (*)    All other components within normal limits  POCT I-STAT, CHEM 8 - Abnormal; Notable for the following:    BUN 4 (*)    Glucose, Bld 134 (*)    Calcium, Ion  1.26 (*)    All other components within normal limits  PREGNANCY, URINE  URINE MICROSCOPIC-ADD ON   Imaging Review Ct Abdomen Pelvis Wo Contrast  05/03/2013   CLINICAL DATA:  Recent urinary tract infection. Pain with urination. Right flank pain.  EXAM: CT ABDOMEN AND PELVIS WITHOUT CONTRAST  TECHNIQUE: Multidetector CT imaging of the abdomen and pelvis was performed following the standard protocol without intravenous contrast.  COMPARISON:  Previous ultrasound exams.  FINDINGS: Lung bases are clear. No pleural or pericardial fluid. The liver has a normal appearance without contrast. No calcified gallstones. The spleen is normal. The pancreas is normal. The adrenal glands are normal. The kidneys are normal without contrast. No evidence of inflammation, cyst, stone, mass or hydronephrosis. The aorta and IVC are normal. No stones seen along the course of either ureter. The ureters are not dilated. No stone in the bladder. Uterus and adnexal regions appear unremarkable. No bowel pathology is seen. The appendix is normal. No significant bony finding. There are scattered phleboliths in the pelvis.  IMPRESSION: Normal noncontrast exam.  No urinary tract pathology seen.   Electronically Signed   By: Paulina Fusi M.D.   On: 05/03/2013 14:01    EKG Interpretation   None       MDM   1. Dysuria   2. Flank pain     Patient presenting with recurrent urinary tract infection and severe right flank pain. She states she was treated for a UTI approximately one month ago and completed a course of amoxicillin. However 2 weeks later her symptoms came back and she is now having severe right flank pain, feeling generally fatigued and weak and dysuria.  She states last year she had multiple urinary tract infections without known cause. She is diabetic and has not checked her blood sugar regularly this month but otherwise has no history of kidney stones.  She denies fever nausea or vomiting. Concern for pyelonephritis versus a retained stone which is causing recurrent infection.  CBC, i-STAT, UA, CT of the abdomen and pelvis without contrast pending for further evaluation.  2:23 PM Labs wnl except for moderate blood on UA.  CT neg for stones or other structural problems.  This could be early infection.  Will treat with course of abx and culture urine.  If not improvement will have pt f/u with Urology.  Gwyneth Sprout, MD 05/03/13 1424  Gwyneth Sprout, MD 05/03/13 1426

## 2013-12-17 LAB — OB RESULTS CONSOLE ANTIBODY SCREEN: Antibody Screen: NEGATIVE

## 2013-12-17 LAB — OB RESULTS CONSOLE HIV ANTIBODY (ROUTINE TESTING): HIV: NONREACTIVE

## 2013-12-17 LAB — OB RESULTS CONSOLE HEPATITIS B SURFACE ANTIGEN: HEP B S AG: NEGATIVE

## 2013-12-17 LAB — OB RESULTS CONSOLE RPR: RPR: NONREACTIVE

## 2013-12-17 LAB — OB RESULTS CONSOLE ABO/RH: RH Type: POSITIVE

## 2013-12-17 LAB — OB RESULTS CONSOLE RUBELLA ANTIBODY, IGM: Rubella: IMMUNE

## 2013-12-30 ENCOUNTER — Encounter (HOSPITAL_COMMUNITY): Payer: Self-pay | Admitting: *Deleted

## 2013-12-30 NOTE — H&P (Signed)
Admission History and Physical Exam for an Obstetrics Patient  [name] is a 38 y.o. female, G3P0110, at 11 weeks 6 days, who presents for a cervical cerclage. She has been followed at the [facility] division of [facility].  Her pregnancy has been complicated by a history of an incompetent cervix. An ultrasound showed a viable intrauterine gestation.  The cervix is 5 cm long. See history below.  OB History   Grav Para Term Preterm Abortions TAB SAB Ect Mult Living        Past Medical History  Diagnosis Date  . Vitamin D deficiency   . Obesity   . Gastroparesis   . Acid reflux   . Fatty liver   . DUB (dysfunctional uterine bleeding)   . H pylori ulcer   . H/O rubella   . H/O varicella   . Depression     stopped meds 2012  . History of anxiety   . History of PCOS 03/07/10  . Oligomenorrhea 09/12/10  . Pelvic pain 03/14/11    right sided back  . H/O hematuria 03/14/11  . History of ovarian cyst   . Yeast infection   . Diabetes mellitus     type 2    No prescriptions prior to admission    Past Surgical History  Procedure Laterality Date  . Dilation and curettage of uterus  2008  . Cervical cerclage  09/15/2011    Procedure: CERCLAGE CERVICAL;  Surgeon: Kirkland Hun, MD;  Location: [facility];  Service: Gynecology;  Laterality: N/A;  . Cesarean section  10/18/2011    Procedure: CESAREAN SECTION;  Surgeon: [name], MD;  Location: [facility];  Service: Gynecology;  Laterality: N/A;    Allergies  Allergen Reactions  . Sulfa Antibiotics Swelling    Unknown agent in ear drops    Family History: family history includes Diabetes in her father and mother; Hyperlipidemia in her mother; Hypertension in her father and mother.  Social History:  reports that she has never smoked. She has never used smokeless tobacco. She reports that she does not drink alcohol or use illicit drugs.  Review of  systems: Normal pregnancy complaints.  Admission Physical Exam:    There is no weight on file to calculate BMI.  Last menstrual period 04/26/2013.  HEENT:                 Within normal limits Chest:                   Clear Heart:                    Regular rate and rhythm Abdomen:             Gravid and nontender Extremities:          Grossly normal Neurologic exam: Grossly normal Pelvic exam:         Cervix: closed and long  Prenatal labs: ABO, Rh:               O +  Assessment:  11 weeks and 6 day gestation  Incompetent cervix  Hypothyroidism  Diabetes  Obesity  Anxiety  Plan:  The patient will undergo a McDonald cerclage.  She understands the indications for her procedure.  She understands the alternative treatment options.  She accepts the risk of, but not limited to, anesthetic complications, bleeding, infections,  and possible damage to the surrounding organs.   Janine Limbo 12/30/2013, 6:52 PM

## 2013-12-31 ENCOUNTER — Encounter (HOSPITAL_COMMUNITY)
Admission: RE | Disposition: A | Discharge status: Home / Self Care | Payer: Self-pay | Source: Ambulatory Visit | Attending: Obstetrics and Gynecology

## 2013-12-31 ENCOUNTER — Encounter (HOSPITAL_COMMUNITY): Payer: BC Managed Care – PPO | Admitting: Anesthesiology

## 2013-12-31 ENCOUNTER — Encounter (HOSPITAL_COMMUNITY): Payer: Self-pay | Admitting: *Deleted

## 2013-12-31 ENCOUNTER — Ambulatory Visit (HOSPITAL_COMMUNITY)
Admission: RE | Admit: 2013-12-31 | Discharge: 2014-01-01 | Disposition: A | Discharge status: Home / Self Care | Payer: BC Managed Care – PPO | Source: Ambulatory Visit | Attending: Obstetrics and Gynecology | Admitting: Obstetrics and Gynecology

## 2013-12-31 ENCOUNTER — Ambulatory Visit (HOSPITAL_COMMUNITY): Payer: BC Managed Care – PPO | Admitting: Anesthesiology

## 2013-12-31 DIAGNOSIS — E039 Hypothyroidism, unspecified: Secondary | ICD-10-CM | POA: Insufficient documentation

## 2013-12-31 DIAGNOSIS — O3430 Maternal care for cervical incompetence, unspecified trimester: Secondary | ICD-10-CM | POA: Insufficient documentation

## 2013-12-31 DIAGNOSIS — E669 Obesity, unspecified: Secondary | ICD-10-CM | POA: Diagnosis not present

## 2013-12-31 DIAGNOSIS — F341 Dysthymic disorder: Secondary | ICD-10-CM | POA: Diagnosis not present

## 2013-12-31 DIAGNOSIS — O99340 Other mental disorders complicating pregnancy, unspecified trimester: Secondary | ICD-10-CM | POA: Diagnosis not present

## 2013-12-31 DIAGNOSIS — E119 Type 2 diabetes mellitus without complications: Secondary | ICD-10-CM | POA: Diagnosis not present

## 2013-12-31 DIAGNOSIS — O99280 Endocrine, nutritional and metabolic diseases complicating pregnancy, unspecified trimester: Secondary | ICD-10-CM

## 2013-12-31 DIAGNOSIS — E079 Disorder of thyroid, unspecified: Secondary | ICD-10-CM | POA: Diagnosis not present

## 2013-12-31 DIAGNOSIS — Z6841 Body Mass Index (BMI) 40.0 and over, adult: Secondary | ICD-10-CM | POA: Insufficient documentation

## 2013-12-31 DIAGNOSIS — N883 Incompetence of cervix uteri: Secondary | ICD-10-CM | POA: Diagnosis present

## 2013-12-31 DIAGNOSIS — K219 Gastro-esophageal reflux disease without esophagitis: Secondary | ICD-10-CM | POA: Diagnosis not present

## 2013-12-31 DIAGNOSIS — E559 Vitamin D deficiency, unspecified: Secondary | ICD-10-CM | POA: Insufficient documentation

## 2013-12-31 DIAGNOSIS — O24919 Unspecified diabetes mellitus in pregnancy, unspecified trimester: Secondary | ICD-10-CM | POA: Diagnosis not present

## 2013-12-31 DIAGNOSIS — O99210 Obesity complicating pregnancy, unspecified trimester: Secondary | ICD-10-CM

## 2013-12-31 HISTORY — PX: CERVICAL CERCLAGE: SHX1329

## 2013-12-31 LAB — BASIC METABOLIC PANEL
Anion gap: 12 (ref 5–15)
BUN: 5 mg/dL — AB (ref 6–23)
CHLORIDE: 101 meq/L (ref 96–112)
CO2: 22 mEq/L (ref 19–32)
Calcium: 8.8 mg/dL (ref 8.4–10.5)
Creatinine, Ser: 0.45 mg/dL — ABNORMAL LOW (ref 0.50–1.10)
GFR calc Af Amer: >90 mL/min (ref >90)
GLUCOSE: 110 mg/dL — AB (ref 70–99)
Potassium: 4.2 mEq/L (ref 3.7–5.3)
Sodium: 135 mEq/L — ABNORMAL LOW (ref 137–147)

## 2013-12-31 LAB — GLUCOSE, CAPILLARY
GLUCOSE-CAPILLARY: 117 mg/dL — AB (ref 70–99)
Glucose-Capillary: 109 mg/dL — ABNORMAL HIGH (ref 70–99)
Glucose-Capillary: 110 mg/dL — ABNORMAL HIGH (ref 70–99)
Glucose-Capillary: 83 mg/dL (ref 70–99)
Glucose-Capillary: 125 mg/dL — ABNORMAL HIGH (ref 70–99)
Glucose-Capillary: 128 mg/dL — ABNORMAL HIGH (ref 70–99)

## 2013-12-31 LAB — CBC
HEMATOCRIT: 32.7 % — AB (ref 36.0–46.0)
HEMOGLOBIN: 10.9 g/dL — AB (ref 12.0–15.0)
MCH: 26.1 pg (ref 26.0–34.0)
MCHC: 33.3 g/dL (ref 30.0–36.0)
MCV: 78.2 fL (ref 78.0–100.0)
Platelets: 227 10*3/uL (ref 150–400)
RBC: 4.18 MIL/uL (ref 3.87–5.11)
RDW: 14.9 % (ref 11.5–15.5)
WBC: 10.3 10*3/uL (ref 4.0–10.5)

## 2013-12-31 SURGERY — CERCLAGE, CERVIX, VAGINAL APPROACH
Anesthesia: Spinal | Site: Vagina

## 2013-12-31 MED ORDER — DIPHENHYDRAMINE HCL 50 MG/ML IJ SOLN: 12.5000 mg | INTRAMUSCULAR | Status: DC | PRN

## 2013-12-31 MED ORDER — OXYCODONE-ACETAMINOPHEN 5-325 MG PO TABS: 1.0000 | ORAL_TABLET | ORAL | Status: DC | PRN

## 2013-12-31 MED ORDER — KETOROLAC TROMETHAMINE 30 MG/ML IJ SOLN
30.0000 mg | Freq: Four times a day (QID) | INTRAMUSCULAR | Status: DC | PRN
Start: 2013-12-31 — End: 2013-12-31

## 2013-12-31 MED ORDER — MEPERIDINE HCL 25 MG/ML IJ SOLN: 6.2500 mg | INTRAMUSCULAR | Status: DC | PRN

## 2013-12-31 MED ORDER — SIMETHICONE 80 MG PO CHEW: 80.0000 mg | CHEWABLE_TABLET | Freq: Four times a day (QID) | ORAL | Status: DC | PRN

## 2013-12-31 MED ORDER — INSULIN LISPRO 100 UNIT/ML (KWIKPEN): 4.0000 [IU] | PEN_INJECTOR | Freq: Three times a day (TID) | SUBCUTANEOUS | Status: DC

## 2013-12-31 MED ORDER — NALBUPHINE HCL 10 MG/ML IJ SOLN: 5.0000 mg | INTRAMUSCULAR | Status: DC | PRN

## 2013-12-31 MED ORDER — SODIUM CHLORIDE 0.9 % IJ SOLN: 3.0000 mL | INTRAMUSCULAR | Status: DC | PRN

## 2013-12-31 MED ORDER — BUPIVACAINE IN DEXTROSE 0.75-8.25 % IT SOLN
INTRATHECAL | Status: AC
  Filled 2013-12-31: qty 2

## 2013-12-31 MED ORDER — PRENATAL MULTIVITAMIN CH
1.0000 | ORAL_TABLET | Freq: Every day | ORAL | Status: DC
  Administered 2014-01-01: 1 via ORAL
  Filled 2013-12-31: qty 1

## 2013-12-31 MED ORDER — SCOPOLAMINE 1 MG/3DAYS TD PT72
1.0000 | MEDICATED_PATCH | Freq: Once | TRANSDERMAL | Status: DC
  Filled 2013-12-31: qty 1

## 2013-12-31 MED ORDER — METFORMIN HCL ER 500 MG PO TB24
1000.0000 mg | ORAL_TABLET | Freq: Every day | ORAL | Status: DC
  Administered 2013-12-31: 1000 mg via ORAL
  Filled 2013-12-31 (×2): qty 2

## 2013-12-31 MED ORDER — INSULIN ASPART 100 UNIT/ML ~~LOC~~ SOLN
4.0000 [IU] | Freq: Three times a day (TID) | SUBCUTANEOUS | Status: DC
  Administered 2013-12-31: 8 [IU] via SUBCUTANEOUS
  Administered 2013-12-31: 4 [IU] via SUBCUTANEOUS
  Administered 2014-01-01: 6 [IU] via SUBCUTANEOUS
  Administered 2014-01-01: 4 [IU] via SUBCUTANEOUS

## 2013-12-31 MED ORDER — PROGESTERONE 25 MG VA SUPP
25.0000 mg | Freq: Every day | VAGINAL | Status: DC
  Administered 2013-12-31: 25 mg via VAGINAL
  Filled 2013-12-31 (×2): qty 1

## 2013-12-31 MED ORDER — METFORMIN HCL ER 750 MG PO TB24
2000.0000 mg | ORAL_TABLET | Freq: Every day | ORAL | Status: DC
  Filled 2013-12-31: qty 1

## 2013-12-31 MED ORDER — METOCLOPRAMIDE HCL 5 MG/ML IJ SOLN
10.0000 mg | Freq: Three times a day (TID) | INTRAMUSCULAR | Status: DC | PRN
  Filled 2013-12-31: qty 2

## 2013-12-31 MED ORDER — PHENYLEPHRINE HCL 10 MG/ML IJ SOLN
INTRAMUSCULAR | Status: DC | PRN
  Administered 2013-12-31 (×2): 80 ug via INTRAVENOUS

## 2013-12-31 MED ORDER — KETOROLAC TROMETHAMINE 30 MG/ML IJ SOLN
INTRAMUSCULAR | Status: AC
  Filled 2013-12-31: qty 1

## 2013-12-31 MED ORDER — LEVOTHYROXINE SODIUM 100 MCG PO TABS
100.0000 ug | ORAL_TABLET | Freq: Every day | ORAL | Status: DC
  Administered 2014-01-01: 100 ug via ORAL
  Filled 2013-12-31 (×2): qty 1

## 2013-12-31 MED ORDER — DOCUSATE SODIUM 100 MG PO CAPS
100.0000 mg | ORAL_CAPSULE | Freq: Two times a day (BID) | ORAL | Status: DC
  Administered 2013-12-31 – 2014-01-01 (×2): 100 mg via ORAL
  Filled 2013-12-31 (×2): qty 1

## 2013-12-31 MED ORDER — PROPOFOL 10 MG/ML IV EMUL
INTRAVENOUS | Status: AC
  Filled 2013-12-31: qty 20

## 2013-12-31 MED ORDER — NALOXONE HCL 1 MG/ML IJ SOLN: 1.0000 ug/kg/h | INTRAMUSCULAR | Status: DC | PRN

## 2013-12-31 MED ORDER — DIPHENHYDRAMINE HCL 50 MG/ML IJ SOLN: 25.0000 mg | INTRAMUSCULAR | Status: DC | PRN

## 2013-12-31 MED ORDER — LACTATED RINGERS IV SOLN
INTRAVENOUS | Status: DC
  Administered 2013-12-31: 10 mL/h via INTRAVENOUS
  Administered 2013-12-31: 07:00:00 via INTRAVENOUS

## 2013-12-31 MED ORDER — SCOPOLAMINE 1 MG/3DAYS TD PT72
1.0000 | MEDICATED_PATCH | Freq: Once | TRANSDERMAL | Status: DC
  Administered 2013-12-31: 1.5 mg via TRANSDERMAL

## 2013-12-31 MED ORDER — KETOROLAC TROMETHAMINE 30 MG/ML IJ SOLN
30.0000 mg | Freq: Four times a day (QID) | INTRAMUSCULAR | Status: DC | PRN
  Administered 2013-12-31: 30 mg via INTRAVENOUS

## 2013-12-31 MED ORDER — ONDANSETRON HCL 4 MG/2ML IJ SOLN: 4.0000 mg | Freq: Three times a day (TID) | INTRAMUSCULAR | Status: DC | PRN

## 2013-12-31 MED ORDER — DIPHENHYDRAMINE HCL 25 MG PO CAPS: 25.0000 mg | ORAL_CAPSULE | ORAL | Status: DC | PRN

## 2013-12-31 MED ORDER — LIDOCAINE HCL (CARDIAC) 20 MG/ML IV SOLN
INTRAVENOUS | Status: AC
  Filled 2013-12-31: qty 5

## 2013-12-31 MED ORDER — PROPOFOL 10 MG/ML IV BOLUS
INTRAVENOUS | Status: DC | PRN
  Administered 2013-12-31 (×2): 20 mg via INTRAVENOUS

## 2013-12-31 MED ORDER — NALOXONE HCL 0.4 MG/ML IJ SOLN: 0.4000 mg | INTRAMUSCULAR | Status: DC | PRN

## 2013-12-31 MED ORDER — ONDANSETRON HCL 4 MG/2ML IJ SOLN: 4.0000 mg | Freq: Four times a day (QID) | INTRAMUSCULAR | Status: DC | PRN

## 2013-12-31 MED ORDER — LIDOCAINE IN DEXTROSE 5-7.5 % IV SOLN
INTRAVENOUS | Status: AC
  Filled 2013-12-31: qty 2

## 2013-12-31 MED ORDER — ONDANSETRON HCL 4 MG/2ML IJ SOLN
INTRAMUSCULAR | Status: AC
  Filled 2013-12-31: qty 2

## 2013-12-31 MED ORDER — FENTANYL CITRATE 0.05 MG/ML IJ SOLN
INTRAMUSCULAR | Status: AC
  Filled 2013-12-31: qty 2

## 2013-12-31 MED ORDER — FENTANYL CITRATE 0.05 MG/ML IJ SOLN
INTRAMUSCULAR | Status: DC | PRN
  Administered 2013-12-31 (×2): 50 ug via INTRAVENOUS

## 2013-12-31 MED ORDER — HYDROMORPHONE HCL PF 1 MG/ML IJ SOLN: 0.2500 mg | INTRAMUSCULAR | Status: DC | PRN

## 2013-12-31 MED ORDER — PHENYLEPHRINE 40 MCG/ML (10ML) SYRINGE FOR IV PUSH (FOR BLOOD PRESSURE SUPPORT)
PREFILLED_SYRINGE | INTRAVENOUS | Status: AC
  Filled 2013-12-31: qty 5

## 2013-12-31 MED ORDER — ACETAMINOPHEN 500 MG PO TABS
1000.0000 mg | ORAL_TABLET | Freq: Four times a day (QID) | ORAL | Status: DC | PRN
  Administered 2014-01-01: 1000 mg via ORAL
  Filled 2013-12-31: qty 2

## 2013-12-31 MED ORDER — ONDANSETRON HCL 4 MG PO TABS: 4.0000 mg | ORAL_TABLET | Freq: Four times a day (QID) | ORAL | Status: DC | PRN

## 2013-12-31 MED ORDER — SCOPOLAMINE 1 MG/3DAYS TD PT72
MEDICATED_PATCH | TRANSDERMAL | Status: AC
  Administered 2013-12-31: 1.5 mg via TRANSDERMAL
  Filled 2013-12-31: qty 1

## 2013-12-31 MED ORDER — NITROFURANTOIN MONOHYD MACRO 100 MG PO CAPS
100.0000 mg | ORAL_CAPSULE | Freq: Two times a day (BID) | ORAL | Status: DC
  Administered 2013-12-31 – 2014-01-01 (×3): 100 mg via ORAL
  Filled 2013-12-31 (×5): qty 1

## 2013-12-31 MED ORDER — DEXTROSE IN LACTATED RINGERS 5 % IV SOLN
INTRAVENOUS | Status: DC
  Administered 2013-12-31 – 2014-01-01 (×3): via INTRAVENOUS

## 2013-12-31 MED ORDER — ENOXAPARIN SODIUM 40 MG/0.4ML ~~LOC~~ SOLN
40.0000 mg | SUBCUTANEOUS | Status: DC
  Filled 2013-12-31: qty 0.4

## 2013-12-31 MED ORDER — INSULIN GLARGINE 100 UNIT/ML ~~LOC~~ SOLN
12.0000 [IU] | Freq: Every day | SUBCUTANEOUS | Status: DC
  Administered 2013-12-31: 12 [IU] via SUBCUTANEOUS
  Filled 2013-12-31 (×2): qty 0.12

## 2013-12-31 MED ORDER — BUPIVACAINE-EPINEPHRINE (PF) 0.5% -1:200000 IJ SOLN
INTRAMUSCULAR | Status: AC
  Filled 2013-12-31: qty 30

## 2013-12-31 SURGICAL SUPPLY — 18 items
CLOTH BEACON ORANGE TIMEOUT ST (SAFETY) ×3 IMPLANT
COUNTER NEEDLE 1200 MAGNETIC (NEEDLE) IMPLANT
GLOVE BIOGEL PI IND STRL 8.5 (GLOVE) ×1 IMPLANT
GLOVE BIOGEL PI INDICATOR 8.5 (GLOVE) ×2
GLOVE ECLIPSE 8.0 STRL XLNG CF (GLOVE) ×6 IMPLANT
GOWN STRL REUS W/TWL LRG LVL3 (GOWN DISPOSABLE) ×6 IMPLANT
NEEDLE MA TROC 1/2 (NEEDLE) IMPLANT
NEEDLE MAYO .5 CIRCLE (NEEDLE) ×3 IMPLANT
PACK VAGINAL MINOR WOMEN LF (CUSTOM PROCEDURE TRAY) ×3 IMPLANT
PAD OB MATERNITY 4.3X12.25 (PERSONAL CARE ITEMS) ×3 IMPLANT
PAD PREP 24X48 CUFFED NSTRL (MISCELLANEOUS) ×3 IMPLANT
SUT MERSILENE 5MM BP 1 12 (SUTURE) ×3 IMPLANT
TOWEL OR 17X24 6PK STRL BLUE (TOWEL DISPOSABLE) ×6 IMPLANT
TRAY FOLEY CATH 14FR (SET/KITS/TRAYS/PACK) ×3 IMPLANT
TUBING NON-CON 1/4 X 20 CONN (TUBING) IMPLANT
TUBING NON-CON 1/4 X 20' CONN (TUBING)
WATER STERILE IRR 1000ML POUR (IV SOLUTION) ×3 IMPLANT
YANKAUER SUCT BULB TIP NO VENT (SUCTIONS) IMPLANT

## 2013-12-31 NOTE — Op Note (Signed)
OPERATIVE NOTE  Kathryn Howard  DOB:    1975/10/18  MRN:    [id number]  CSN:    [id number]  Date of Surgery:  12/31/2013  Preoperative Diagnosis:  11 weeks and 6 days gestation  Incompetent cervix  Postoperative Diagnosis:  Same  Procedure:  McDonald cerclage  Surgeon:  Leonard Schwartz, M.D.  Assistant:  None  Anesthetic:  Spinal  Disposition:  The patient presents with a history of incompetent cervix. She understands the indications for her surgical procedure. She understands her alternative treatment options. She accepts the risk of, but not limited to, anesthetic complications, bleeding, infections, and also damage to the surrounding organs.  Findings:  The uterus was 12 week size. The cervix was long and closed.  Procedure:  The patient was taken to the operating room where a spinal anesthetic was given. The perineum and vagina were prepped with multiple layers of Betadine. A Foley catheter was placed in the bladder. The patient was sterilely draped. An examination under anesthesia was performed. The cervix was noted to be closed and long. Ring forceps were placed on the cervix. A circumferential stitch of #5 Mersilene was placed at the level of the endocervix. The knot was tied in the posterior vagina. Care was taken not to damage the bladder or the fetal membranes. The patient tolerated the procedure well. Hemostasis was adequate. Sponge and needle counts were correct. The estimated blood loss was 10 cc. The patient was returned to the supine position. She was transported to the recovery room in stable condition.  Followup instructions:  The patient will remain in the hospital overnight for observation.  Leonard Schwartz, M.D.

## 2013-12-31 NOTE — H&P (Signed)
The patient was interviewed and examined today.  The previously documented history and physical examination was reviewed. There are no changes. The operative procedure was reviewed. The risks and benefits were outlined again. The specific risks include, but are not limited to, anesthetic complications, bleeding, infections, and possible damage to the surrounding organs. The patient's questions were answered.  We are ready to proceed as outlined. The likelihood of the patient achieving the goals of this procedure is very likely.   BP 118/70  Pulse 92  Temp(Src) 98.2 F (36.8 C) (Oral)  Resp 18  Ht  (1.6 m)  Wt 235 lb (106.595 kg)  BMI 41.64 kg/m2  SpO2 100%  LMP 04/26/2013  Results for orders placed during the hospital encounter of 12/31/13 (from the past 24 hour(s))  CBC     Status: Abnormal   Collection Time    12/31/13  6:25 AM      Result Value Ref Range   WBC 10.3  4.0 - 10.5 K/uL   RBC 4.18  3.87 - 5.11 MIL/uL   Hemoglobin 10.9 (*) 12.0 - 15.0 g/dL   HCT 40.9 (*) 81.1 - 91.4 %   MCV 78.2  78.0 - 100.0 fL   MCH 26.1  26.0 - 34.0 pg   MCHC 33.3  30.0 - 36.0 g/dL   RDW 78.2  95.6 - 21.3 %   Platelets 227  150 - 400 K/uL  GLUCOSE, CAPILLARY     Status: Abnormal   Collection Time    12/31/13  6:53 AM      Result Value Ref Range   Glucose-Capillary 109 (*) 70 - 99 mg/dL     Leonard Schwartz, M.D.

## 2013-12-31 NOTE — Anesthesia Preprocedure Evaluation (Signed)
Anesthesia Evaluation  Patient identified by MRN, date of birth, ID band Patient awake    Reviewed: Allergy & Precautions, H&P , NPO status , Patient's Chart, lab work & pertinent test results, reviewed documented beta blocker date and time   History of Anesthesia Complications Negative for: history of anesthetic complications  Airway Mallampati: III TM Distance: >3 FB Neck ROM: full    Dental  (+) Teeth Intact   Pulmonary neg pulmonary ROS,  breath sounds clear to auscultation        Cardiovascular negative cardio ROS  Rhythm:regular Rate:Normal     Neuro/Psych PSYCHIATRIC DISORDERS (h/o anxiety/depression) negative neurological ROS     GI/Hepatic Neg liver ROS, PUD, GERD- (with pregnancy)  ,  Endo/Other  diabetes, Type 2, Oral Hypoglycemic Agents, Insulin DependentMorbid obesity  Renal/GU negative Renal ROS  negative genitourinary   Musculoskeletal   Abdominal   Peds  Hematology negative hematology ROS (+)   Anesthesia Other Findings   Reproductive/Obstetrics (+) Pregnancy (18 week, incompetent cervix)                           Anesthesia Physical  Anesthesia Plan  ASA: III  Anesthesia Plan: Spinal   Post-op Pain Management:    Induction:   Airway Management Planned:   Additional Equipment:   Intra-op Plan:   Post-operative Plan:   Informed Consent: I have reviewed the patients History and Physical, chart, labs and discussed the procedure including the risks, benefits and alternatives for the proposed anesthesia with the patient or authorized representative who has indicated his/her understanding and acceptance.     Plan Discussed with: Surgeon and CRNA  Anesthesia Plan Comments:         Anesthesia Quick Evaluation

## 2013-12-31 NOTE — Anesthesia Postprocedure Evaluation (Signed)
  Anesthesia Post Note  Patient: Kathryn Howard  Procedure(s) Performed: Procedure(s) (LRB): CERCLAGE CERVICAL (N/A)  Anesthesia type: Spinal  Patient location: PACU  Post pain: Pain level controlled  Post assessment: Post-op Vital signs reviewed  Last Vitals:  Filed Vitals:   12/31/13 0805  BP:   Pulse: 73  Temp:   Resp:     Post vital signs: Reviewed  Level of consciousness: awake  Complications: No apparent anesthesia complications

## 2013-12-31 NOTE — Addendum Note (Signed)
Addendum created 12/31/13 1356 by Renford Dills, CRNA   Modules edited: Notes Section   Notes Section:  File: [id number]

## 2013-12-31 NOTE — Anesthesia Postprocedure Evaluation (Signed)
  Anesthesia Post-op Note  Patient: Kathryn Howard  Procedure(s) Performed: Procedure(s): CERCLAGE CERVICAL (N/A)  Patient Location: [facility]  Anesthesia Type:Spinal  Level of Consciousness: awake  Airway and Oxygen Therapy: Patient Spontanous Breathing  Post-op Pain: mild  Post-op Assessment: Patient's Cardiovascular Status Stable and Respiratory Function Stable  Post-op Vital Signs: stable  Last Vitals:  Filed Vitals:   12/31/13 1010  BP: 102/63  Pulse: 78  Temp: 36.4 C  Resp: 16    Complications: No apparent anesthesia complications

## 2013-12-31 NOTE — Anesthesia Procedure Notes (Signed)
Spinal  Patient location during procedure: OR Preanesthetic Checklist Completed: patient identified, site marked, surgical consent, pre-op evaluation, timeout performed, IV checked, risks and benefits discussed and monitors and equipment checked Spinal Block Patient position: sitting Prep: DuraPrep Patient monitoring: heart rate, cardiac monitor, continuous pulse ox and blood pressure Approach: midline Location: L3-4 Injection technique: single-shot Needle Needle type: Sprotte  Needle gauge: 24 G Needle length: 9 cm Assessment Sensory level: T4 Additional Notes Spinal Dosage in OR  5% Xylocaine ml       1.3

## 2013-12-31 NOTE — Transfer of Care (Signed)
Immediate Anesthesia Transfer of Care Note  Patient: Kathryn Howard  Procedure(s) Performed: Procedure(s): CERCLAGE CERVICAL (N/A)  Patient Location: PACU  Anesthesia Type:Spinal  Level of Consciousness: awake, alert  and oriented  Airway & Oxygen Therapy: Patient Spontanous Breathing and Patient connected to nasal cannula oxygen  Post-op Assessment: Report given to PACU RN, Post -op Vital signs reviewed and stable and Patient moving all extremities  Post vital signs: Reviewed and stable  Complications: No apparent anesthesia complications

## 2014-01-01 ENCOUNTER — Encounter (HOSPITAL_COMMUNITY): Payer: Self-pay | Admitting: Obstetrics and Gynecology

## 2014-01-01 DIAGNOSIS — O3430 Maternal care for cervical incompetence, unspecified trimester: Secondary | ICD-10-CM | POA: Diagnosis not present

## 2014-01-01 LAB — GLUCOSE, CAPILLARY
Glucose-Capillary: 115 mg/dL — ABNORMAL HIGH (ref 70–99)
Glucose-Capillary: 119 mg/dL — ABNORMAL HIGH (ref 70–99)
Glucose-Capillary: 100 mg/dL — ABNORMAL HIGH (ref 70–99)

## 2014-01-01 MED ORDER — PROGESTERONE 25 MG VA SUPP
25.0000 mg | Freq: Every morning | VAGINAL | Status: DC
  Administered 2014-01-01: 25 mg via VAGINAL

## 2014-01-01 NOTE — Discharge Summary (Signed)
ADMISSION DIAGNOSIS:  History of cervical incompetence.  PCOS. Diabetes Mellitus. Hypothyroidism.   DISCHARGE DIAGNOSIS: History of cervical incompetence.  Britt Bottom Cerclage placement.   PCOS. Diabetes Mellitus. Hypothyroidism.   PROCEDURE:  Britt Bottom Cerclage placement.   COMPLICATIONS None.   HOSPITAL COURSE: 38 y/o G3P0111 presented at 11 weeks 6  Days EGA for a cerclage placement.  With history of cervical incompetence, hypothyroidism, diabetes type 2, PCOS, female circumcision. Cerclage procedure was done without any complications. Postoperatively she did well and was deemed stable for discharge on POD # 1 after overnight observation.  Patient denies abdominal or pelvic pain, or vaginal bleeding. She complained of runny nose and nasal congestion similar to prior cold infections, that had began on day of discharge.    PHYSICAL EXAM: Filed Vitals:   01/01/14 1400  BP: 108/53  Pulse: 83  Temp: 98.6 F (37 C)  Resp: 18   GEN: NAD. ABD: S/NT/Gravid  Ext: [facility], no edema, no CT.  LABS Recent Results (from the past 2160 hour(s))  CBC     Status: Abnormal   Collection Time    12/31/13  6:25 AM      Result Value Ref Range   WBC 10.3  4.0 - 10.5 K/uL   RBC 4.18  3.87 - 5.11 MIL/uL   Hemoglobin 10.9 (*) 12.0 - 15.0 g/dL   HCT 32.7 (*) 36.0 - 46.0 %   MCV 78.2  78.0 - 100.0 fL   MCH 26.1  26.0 - 34.0 pg   MCHC 33.3  30.0 - 36.0 g/dL   RDW 14.9  11.5 - 15.5 %   Platelets 227  150 - 400 K/uL  BASIC METABOLIC PANEL     Status: Abnormal   Collection Time    12/31/13  6:25 AM      Result Value Ref Range   Sodium 135 (*) 137 - 147 mEq/L   Potassium 4.2  3.7 - 5.3 mEq/L   Chloride 101  96 - 112 mEq/L   CO2 22  19 - 32 mEq/L   Glucose, Bld 110 (*) 70 - 99 mg/dL   BUN 5 (*) 6 - 23 mg/dL   Creatinine, Ser 0.45 (*) 0.50 - 1.10 mg/dL   Calcium 8.8  8.4 - 10.5 mg/dL   GFR calc non Af Amer >90  >90 mL/min   GFR calc Af Amer >90  >90 mL/min   Comment: (NOTE)     The eGFR has been  calculated using the CKD EPI equation.     This calculation has not been validated in all clinical situations.     eGFR's persistently <90 mL/min signify possible Chronic Kidney     Disease.   Anion gap 12  5 - 15  GLUCOSE, CAPILLARY     Status: Abnormal   Collection Time    12/31/13  6:53 AM      Result Value Ref Range   Glucose-Capillary 109 (*) 70 - 99 mg/dL  GLUCOSE, CAPILLARY     Status: Abnormal   Collection Time    12/31/13  8:10 AM      Result Value Ref Range   Glucose-Capillary 110 (*) 70 - 99 mg/dL  GLUCOSE, CAPILLARY     Status: None   Collection Time    12/31/13 11:52 AM      Result Value Ref Range   Glucose-Capillary 83  70 - 99 mg/dL  GLUCOSE, CAPILLARY     Status: Abnormal   Collection Time    12/31/13  2:13 PM      Result Value Ref Range   Glucose-Capillary 125 (*) 70 - 99 mg/dL  GLUCOSE, CAPILLARY     Status: Abnormal   Collection Time    12/31/13  7:29 PM      Result Value Ref Range   Glucose-Capillary 128 (*) 70 - 99 mg/dL   Comment 1 Notify RN    GLUCOSE, CAPILLARY     Status: Abnormal   Collection Time    12/31/13  9:17 PM      Result Value Ref Range   Glucose-Capillary 117 (*) 70 - 99 mg/dL  GLUCOSE, CAPILLARY     Status: Abnormal   Collection Time    01/01/14  8:07 AM      Result Value Ref Range   Glucose-Capillary 115 (*) 70 - 99 mg/dL  GLUCOSE, CAPILLARY     Status: Abnormal   Collection Time    01/01/14 10:07 AM      Result Value Ref Range   Glucose-Capillary 119 (*) 70 - 99 mg/dL  GLUCOSE, CAPILLARY     Status: Abnormal   Collection Time    01/01/14  1:49 PM      Result Value Ref Range   Glucose-Capillary 100 (*) 70 - 99 mg/dL   OUTPATIENT MEDICATION:  Novolog insulin 100  Lantus Insulin  Synthroid Metformin Nitrofurantoin  Progesterone suppository   FOLLOW UP: 2 weeks at office.  DISCHARGE INSTRUCTIONS:  -May use tylenol for pain prn.  -Advised pelvic rest until postop visit in office.  -Advised to call if with heavy  vaginal bleeding, abdominal pain not resolving with tylenol use, fevers or chills.   CONDITION Stable.   DISPO To home.

## 2014-01-01 NOTE — Progress Notes (Signed)
Pt out in wheelchair information on cerclage given

## 2014-01-01 NOTE — Discharge Summary (Signed)
ADMISSION DIAGNOSIS:  History of cervical incompetence.  PCOS. Diabetes Mellitus. Hypothyroidism.   DISCHARGE DIAGNOSIS: History of cervical incompetence.  Katina Dung Cerclage placement.   PCOS. Diabetes Mellitus. Hypothyroidism.   PROCEDURE:  Katina Dung Cerclage placement.   COMPLICATIONS None.   HOSPITAL COURSE: 38 y/o G3P0111 presented at 11 weeks 6  Days EGA for a cerclage placement.  With history of cervical incompetence, hypothyroidism, diabetes type 2, PCOS, female circumcision. Cerclage procedure was done without any complications. Postoperatively she did well and was deemed stable for discharge on POD # 1 after overnight observation.  Patient denies abdominal or pelvic pain, or vaginal bleeding. She complained of runny nose and nasal congestion similar to prior cold infections, that had began on day of discharge.    PHYSICAL EXAM: Filed Vitals:   01/01/14 1000  BP: 107/43  Pulse: 78  Temp: 98.1 F (36.7 C)  Resp: 18   GEN: NAD. ABD: S/NT/Gravid  Ext: [facility], no edema, no CT.  LABS Recent Results (from the past 2160 hour(s))  CBC     Status: Abnormal   Collection Time    12/31/13  6:25 AM      Result Value Ref Range   WBC 10.3  4.0 - 10.5 K/uL   RBC 4.18  3.87 - 5.11 MIL/uL   Hemoglobin 10.9 (*) 12.0 - 15.0 g/dL   HCT 83.3 (*) 82.5 - 05.3 %   MCV 78.2  78.0 - 100.0 fL   MCH 26.1  26.0 - 34.0 pg   MCHC 33.3  30.0 - 36.0 g/dL   RDW 97.6  73.4 - 19.3 %   Platelets 227  150 - 400 K/uL  BASIC METABOLIC PANEL     Status: Abnormal   Collection Time    12/31/13  6:25 AM      Result Value Ref Range   Sodium 135 (*) 137 - 147 mEq/L   Potassium 4.2  3.7 - 5.3 mEq/L   Chloride 101  96 - 112 mEq/L   CO2 22  19 - 32 mEq/L   Glucose, Bld 110 (*) 70 - 99 mg/dL   BUN 5 (*) 6 - 23 mg/dL   Creatinine, Ser 7.90 (*) 0.50 - 1.10 mg/dL   Calcium 8.8  8.4 - 24.0 mg/dL   GFR calc non Af Amer >90  >90 mL/min   GFR calc Af Amer >90  >90 mL/min   Comment: (NOTE)     The eGFR has  been calculated using the CKD EPI equation.     This calculation has not been validated in all clinical situations.     eGFR's persistently <90 mL/min signify possible Chronic Kidney     Disease.   Anion gap 12  5 - 15  GLUCOSE, CAPILLARY     Status: Abnormal   Collection Time    12/31/13  6:53 AM      Result Value Ref Range   Glucose-Capillary 109 (*) 70 - 99 mg/dL  GLUCOSE, CAPILLARY     Status: Abnormal   Collection Time    12/31/13  8:10 AM      Result Value Ref Range   Glucose-Capillary 110 (*) 70 - 99 mg/dL  GLUCOSE, CAPILLARY     Status: None   Collection Time    12/31/13 11:52 AM      Result Value Ref Range   Glucose-Capillary 83  70 - 99 mg/dL  GLUCOSE, CAPILLARY     Status: Abnormal   Collection Time    12/31/13  2:13 PM      Result Value Ref Range   Glucose-Capillary 125 (*) 70 - 99 mg/dL  GLUCOSE, CAPILLARY     Status: Abnormal   Collection Time    12/31/13  7:29 PM      Result Value Ref Range   Glucose-Capillary 128 (*) 70 - 99 mg/dL   Comment 1 Notify RN    GLUCOSE, CAPILLARY     Status: Abnormal   Collection Time    12/31/13  9:17 PM      Result Value Ref Range   Glucose-Capillary 117 (*) 70 - 99 mg/dL  GLUCOSE, CAPILLARY     Status: Abnormal   Collection Time    01/01/14  8:07 AM      Result Value Ref Range   Glucose-Capillary 115 (*) 70 - 99 mg/dL  GLUCOSE, CAPILLARY     Status: Abnormal   Collection Time    01/01/14 10:07 AM      Result Value Ref Range   Glucose-Capillary 119 (*) 70 - 99 mg/dL   OUTPATIENT MEDICATION:  Novolog insulin 100  Lantus Insulin  Synthroid Metformin Nitrofurantoin  Progesterone suppository   FOLLOW UP: 2 weeks at office.  DISCHARGE INSTRUCTIONS:  -May use tylenol for pain prn.  -Advised pelvic rest until postop visit in office.  -Advised to call if with heavy vaginal bleeding, abdominal pain not resolving with tylenol use, fevers or chills.   CONDITION Stable.   DISPO To home.

## 2014-01-16 NOTE — Addendum Note (Signed)
Addendum created 01/16/14 1916 by Brayton Caves, MD   Modules edited: Anesthesia Responsible Staff

## 2014-03-02 ENCOUNTER — Encounter (HOSPITAL_COMMUNITY): Payer: Self-pay | Admitting: Obstetrics and Gynecology

## 2014-04-09 ENCOUNTER — Other Ambulatory Visit: Payer: Self-pay | Admitting: Obstetrics and Gynecology

## 2014-04-09 ENCOUNTER — Encounter (HOSPITAL_COMMUNITY): Payer: Self-pay | Admitting: *Deleted

## 2014-04-09 ENCOUNTER — Inpatient Hospital Stay (HOSPITAL_COMMUNITY)
Admission: AD | Admit: 2014-04-09 | Discharge: 2014-04-09 | Disposition: A | Discharge status: Home / Self Care | Payer: BC Managed Care – PPO | Source: Ambulatory Visit | Attending: Obstetrics & Gynecology | Admitting: Obstetrics & Gynecology

## 2014-04-09 DIAGNOSIS — Z3A26 26 weeks gestation of pregnancy: Secondary | ICD-10-CM | POA: Insufficient documentation

## 2014-04-09 DIAGNOSIS — O3432 Maternal care for cervical incompetence, second trimester: Secondary | ICD-10-CM | POA: Diagnosis not present

## 2014-04-09 MED ORDER — BETAMETHASONE SOD PHOS & ACET 6 (3-3) MG/ML IJ SUSP
12.0000 mg | INTRAMUSCULAR | Status: DC
  Administered 2014-04-09: 12 mg via INTRAMUSCULAR
  Filled 2014-04-09: qty 2

## 2014-04-09 NOTE — MAU Note (Signed)
Hx of PTL with last preg, and has cerclage. Cervix starting  To thin.

## 2014-04-10 ENCOUNTER — Inpatient Hospital Stay (HOSPITAL_COMMUNITY)
Admission: AD | Admit: 2014-04-10 | Discharge: 2014-04-10 | Disposition: A | Discharge status: Home / Self Care | Payer: BC Managed Care – PPO | Source: Ambulatory Visit | Attending: Obstetrics and Gynecology | Admitting: Obstetrics and Gynecology

## 2014-04-10 DIAGNOSIS — Z3A26 26 weeks gestation of pregnancy: Secondary | ICD-10-CM | POA: Insufficient documentation

## 2014-04-10 MED ORDER — BETAMETHASONE SOD PHOS & ACET 6 (3-3) MG/ML IJ SUSP
12.0000 mg | Freq: Once | INTRAMUSCULAR | Status: AC
  Administered 2014-04-10: 12 mg via INTRAMUSCULAR
  Filled 2014-04-10: qty 2

## 2014-05-20 ENCOUNTER — Other Ambulatory Visit: Payer: Self-pay | Admitting: Obstetrics and Gynecology

## 2014-06-22 MED ORDER — MIDAZOLAM HCL 2 MG/2ML IJ SOLN
INTRAMUSCULAR | Status: AC
  Filled 2014-06-22: qty 2

## 2014-06-22 MED ORDER — KETOROLAC TROMETHAMINE 30 MG/ML IJ SOLN
INTRAMUSCULAR | Status: AC
  Filled 2014-06-22: qty 1

## 2014-06-22 MED ORDER — ONDANSETRON HCL 4 MG/2ML IJ SOLN
INTRAMUSCULAR | Status: AC
  Filled 2014-06-22: qty 2

## 2014-06-22 MED ORDER — GLYCOPYRROLATE 0.2 MG/ML IJ SOLN
INTRAMUSCULAR | Status: AC
  Filled 2014-06-22: qty 3

## 2014-06-22 MED ORDER — LIDOCAINE HCL (CARDIAC) 20 MG/ML IV SOLN
INTRAVENOUS | Status: AC
  Filled 2014-06-22: qty 5

## 2014-06-22 MED ORDER — SUCCINYLCHOLINE CHLORIDE 20 MG/ML IJ SOLN
INTRAMUSCULAR | Status: AC
  Filled 2014-06-22: qty 1

## 2014-06-22 MED ORDER — NEOSTIGMINE METHYLSULFATE 10 MG/10ML IV SOLN
INTRAVENOUS | Status: AC
  Filled 2014-06-22: qty 1

## 2014-06-22 MED ORDER — ROCURONIUM BROMIDE 100 MG/10ML IV SOLN
INTRAVENOUS | Status: AC
  Filled 2014-06-22: qty 1

## 2014-06-22 MED ORDER — PROPOFOL 10 MG/ML IV BOLUS
INTRAVENOUS | Status: AC
  Filled 2014-06-22: qty 20

## 2014-06-22 MED ORDER — DEXAMETHASONE SODIUM PHOSPHATE 10 MG/ML IJ SOLN
INTRAMUSCULAR | Status: AC
Start: 2014-06-22 — End: 2014-06-22
  Filled 2014-06-22: qty 1

## 2014-06-22 MED ORDER — FENTANYL CITRATE 0.05 MG/ML IJ SOLN
INTRAMUSCULAR | Status: AC
  Filled 2014-06-22: qty 5

## 2014-06-23 ENCOUNTER — Encounter (HOSPITAL_COMMUNITY): Payer: Self-pay

## 2014-06-23 ENCOUNTER — Encounter (HOSPITAL_COMMUNITY)
Admission: RE | Admit: 2014-06-23 | Discharge: 2014-06-23 | Disposition: A | Discharge status: Home / Self Care | Payer: BLUE CROSS/BLUE SHIELD | Source: Ambulatory Visit | Attending: Obstetrics and Gynecology | Admitting: Obstetrics and Gynecology

## 2014-06-23 HISTORY — DX: Personal history of other medical treatment: Z92.89

## 2014-06-23 HISTORY — DX: Anxiety disorder, unspecified: F41.9

## 2014-06-23 HISTORY — DX: Hypothyroidism, unspecified: E03.9

## 2014-06-23 HISTORY — DX: Anemia, unspecified: D64.9

## 2014-06-23 HISTORY — DX: Headache, unspecified: R51.9

## 2014-06-23 HISTORY — DX: Headache: R51

## 2014-06-23 HISTORY — DX: Allergy status to unspecified drugs, medicaments and biological substances: Z88.9

## 2014-06-23 LAB — BASIC METABOLIC PANEL
Anion gap: 7 (ref 5–15)
BUN: 8 mg/dL (ref 6–23)
CO2: 21 mmol/L (ref 19–32)
Calcium: 8.7 mg/dL (ref 8.4–10.5)
Chloride: 105 mmol/L (ref 96–112)
Creatinine, Ser: 0.47 mg/dL — ABNORMAL LOW (ref 0.50–1.10)
GFR calc Af Amer: >90 mL/min (ref >90)
GFR calc non Af Amer: >90 mL/min (ref >90)
GLUCOSE: 104 mg/dL — AB (ref 70–99)
Potassium: 3.8 mmol/L (ref 3.5–5.1)
SODIUM: 133 mmol/L — AB (ref 135–145)

## 2014-06-23 LAB — CBC
HEMATOCRIT: 33.1 % — AB (ref 36.0–46.0)
HEMOGLOBIN: 10.9 g/dL — AB (ref 12.0–15.0)
MCH: 24.9 pg — AB (ref 26.0–34.0)
MCHC: 32.9 g/dL (ref 30.0–36.0)
MCV: 75.7 fL — AB (ref 78.0–100.0)
Platelets: 213 10*3/uL (ref 150–400)
RBC: 4.37 MIL/uL (ref 3.87–5.11)
RDW: 16.4 % — ABNORMAL HIGH (ref 11.5–15.5)
WBC: 10.9 10*3/uL — ABNORMAL HIGH (ref 4.0–10.5)

## 2014-06-23 NOTE — Patient Instructions (Addendum)
   Your procedure is scheduled on:  Thursday, Feb 25  [facility] at: 12:30 PM Pick up the phone at the desk and dial [phone] and inform us of your arrival.  Please call this number if you have any problems the morning of surgery: [phone]  Remember: Do not eat food after midnight: Wednesday Do not drink clear liquids after: 10 AM Thursday, day of surgery Take these medicines the morning of surgery with a SIP OF WATER:  Synthroid . Patient instructed to withhold Wednesday night metformin dose.  Patient instructed to take humalog 14 units at dinner on Wednesday night and half the lantus dose of 15 units = take 7 units at dinner on Wednesday night.  Do not take any diabetes medication on Thursday, day of surgery.  We will check your sugar upon arrival to Short Stay Dept.    Do not wear jewelry, make-up, or FINGER nail polish No metal in your hair or on your body. Do not wear lotions, powders, perfumes.  You may wear deodorant.  Do not bring valuables to the hospital. Contacts, dentures or bridgework may not be worn into surgery.  Leave suitcase in the car. After Surgery it may be brought to your room. For patients being admitted to the hospital, checkout time is 11:00am the day of discharge.  Home with husband Kathryn Larsenasir Absin cell [phone]

## 2014-06-23 NOTE — Pre-Procedure Instructions (Signed)
SDS BB History Log given to Lab for patient's previous history of blood [facility]  Reviewed diabetes medications for day prior to surgery with Dr. Brayton CavesFreeman Jackson.

## 2014-06-24 ENCOUNTER — Other Ambulatory Visit (HOSPITAL_COMMUNITY): Payer: Managed Care, Other (non HMO)

## 2014-06-24 LAB — RPR: RPR: NONREACTIVE

## 2014-06-25 ENCOUNTER — Encounter (HOSPITAL_COMMUNITY): Payer: Self-pay | Admitting: *Deleted

## 2014-06-25 ENCOUNTER — Encounter (HOSPITAL_COMMUNITY)
Admission: RE | Disposition: A | Discharge status: Home / Self Care | Payer: Self-pay | Source: Ambulatory Visit | Attending: Obstetrics and Gynecology

## 2014-06-25 ENCOUNTER — Inpatient Hospital Stay (HOSPITAL_COMMUNITY): Payer: BLUE CROSS/BLUE SHIELD | Admitting: Registered Nurse

## 2014-06-25 ENCOUNTER — Inpatient Hospital Stay (HOSPITAL_COMMUNITY)
Admission: RE | Admit: 2014-06-25 | Discharge: 2014-06-28 | DRG: 765 | Disposition: A | Discharge status: Home / Self Care | Payer: BLUE CROSS/BLUE SHIELD | Source: Ambulatory Visit | Attending: Obstetrics and Gynecology | Admitting: Obstetrics and Gynecology

## 2014-06-25 DIAGNOSIS — Z3A37 37 weeks gestation of pregnancy: Secondary | ICD-10-CM | POA: Diagnosis present

## 2014-06-25 DIAGNOSIS — E039 Hypothyroidism, unspecified: Secondary | ICD-10-CM | POA: Diagnosis present

## 2014-06-25 DIAGNOSIS — O3421 Maternal care for scar from previous cesarean delivery: Secondary | ICD-10-CM | POA: Diagnosis present

## 2014-06-25 DIAGNOSIS — O99824 Streptococcus B carrier state complicating childbirth: Secondary | ICD-10-CM | POA: Diagnosis present

## 2014-06-25 DIAGNOSIS — O99284 Endocrine, nutritional and metabolic diseases complicating childbirth: Secondary | ICD-10-CM | POA: Diagnosis present

## 2014-06-25 DIAGNOSIS — E669 Obesity, unspecified: Secondary | ICD-10-CM | POA: Diagnosis present

## 2014-06-25 DIAGNOSIS — O09523 Supervision of elderly multigravida, third trimester: Secondary | ICD-10-CM

## 2014-06-25 DIAGNOSIS — Z98891 History of uterine scar from previous surgery: Secondary | ICD-10-CM

## 2014-06-25 DIAGNOSIS — O9903 Anemia complicating the puerperium: Secondary | ICD-10-CM | POA: Diagnosis present

## 2014-06-25 DIAGNOSIS — Z79899 Other long term (current) drug therapy: Secondary | ICD-10-CM

## 2014-06-25 DIAGNOSIS — Z794 Long term (current) use of insulin: Secondary | ICD-10-CM

## 2014-06-25 DIAGNOSIS — N858 Other specified noninflammatory disorders of uterus: Secondary | ICD-10-CM | POA: Diagnosis present

## 2014-06-25 DIAGNOSIS — E119 Type 2 diabetes mellitus without complications: Secondary | ICD-10-CM | POA: Diagnosis present

## 2014-06-25 DIAGNOSIS — O2412 Pre-existing diabetes mellitus, type 2, in childbirth: Secondary | ICD-10-CM | POA: Diagnosis present

## 2014-06-25 DIAGNOSIS — O99214 Obesity complicating childbirth: Secondary | ICD-10-CM | POA: Diagnosis present

## 2014-06-25 DIAGNOSIS — Z3A39 39 weeks gestation of pregnancy: Secondary | ICD-10-CM | POA: Diagnosis present

## 2014-06-25 DIAGNOSIS — D649 Anemia, unspecified: Secondary | ICD-10-CM | POA: Diagnosis present

## 2014-06-25 DIAGNOSIS — E559 Vitamin D deficiency, unspecified: Secondary | ICD-10-CM | POA: Diagnosis present

## 2014-06-25 DIAGNOSIS — O3433 Maternal care for cervical incompetence, third trimester: Secondary | ICD-10-CM | POA: Diagnosis present

## 2014-06-25 HISTORY — PX: CERVICAL CERCLAGE: SHX1329

## 2014-06-25 LAB — GLUCOSE, CAPILLARY
GLUCOSE-CAPILLARY: 76 mg/dL (ref 70–99)
Glucose-Capillary: 82 mg/dL (ref 70–99)

## 2014-06-25 LAB — PREPARE RBC (CROSSMATCH)

## 2014-06-25 SURGERY — Surgical Case
Anesthesia: Spinal | Site: Cervix

## 2014-06-25 MED ORDER — OXYCODONE-ACETAMINOPHEN 5-325 MG PO TABS
1.0000 | ORAL_TABLET | ORAL | Status: DC | PRN
  Administered 2014-06-27 – 2014-06-28 (×5): 1 via ORAL
  Filled 2014-06-25 (×4): qty 1

## 2014-06-25 MED ORDER — MENTHOL 3 MG MT LOZG
1.0000 | LOZENGE | OROMUCOSAL | Status: DC | PRN
  Filled 2014-06-25: qty 9

## 2014-06-25 MED ORDER — NALBUPHINE HCL 10 MG/ML IJ SOLN
5.0000 mg | INTRAMUSCULAR | Status: DC | PRN
  Administered 2014-06-25: 5 mg via SUBCUTANEOUS

## 2014-06-25 MED ORDER — ZOLPIDEM TARTRATE 5 MG PO TABS: 5.0000 mg | ORAL_TABLET | Freq: Every evening | ORAL | Status: DC | PRN

## 2014-06-25 MED ORDER — PHENYLEPHRINE 8 MG IN D5W 100 ML (0.08MG/ML) PREMIX OPTIME
INJECTION | INTRAVENOUS | Status: DC | PRN
  Administered 2014-06-25: 60 ug/min via INTRAVENOUS

## 2014-06-25 MED ORDER — METFORMIN HCL ER 500 MG PO TB24
1000.0000 mg | ORAL_TABLET | Freq: Two times a day (BID) | ORAL | Status: DC
  Administered 2014-06-25 – 2014-06-28 (×8): 1000 mg via ORAL
  Filled 2014-06-25 (×8): qty 2

## 2014-06-25 MED ORDER — BUPIVACAINE IN DEXTROSE 0.75-8.25 % IT SOLN
INTRATHECAL | Status: DC | PRN
  Administered 2014-06-25: 11 mg via INTRATHECAL

## 2014-06-25 MED ORDER — ONDANSETRON HCL 4 MG/2ML IJ SOLN
INTRAMUSCULAR | Status: AC
  Filled 2014-06-25: qty 2

## 2014-06-25 MED ORDER — LEVOTHYROXINE SODIUM 100 MCG PO TABS
100.0000 ug | ORAL_TABLET | Freq: Every day | ORAL | Status: DC
  Administered 2014-06-26 – 2014-06-28 (×3): 100 ug via ORAL
  Filled 2014-06-25 (×5): qty 1

## 2014-06-25 MED ORDER — NALBUPHINE HCL 10 MG/ML IJ SOLN
INTRAMUSCULAR | Status: AC
  Filled 2014-06-25: qty 1

## 2014-06-25 MED ORDER — LACTATED RINGERS IV SOLN: INTRAVENOUS | Status: DC

## 2014-06-25 MED ORDER — PROMETHAZINE HCL 25 MG/ML IJ SOLN: 6.2500 mg | INTRAMUSCULAR | Status: DC | PRN

## 2014-06-25 MED ORDER — FENTANYL CITRATE 0.05 MG/ML IJ SOLN
INTRAMUSCULAR | Status: AC
  Filled 2014-06-25: qty 2

## 2014-06-25 MED ORDER — SENNOSIDES-DOCUSATE SODIUM 8.6-50 MG PO TABS
2.0000 | ORAL_TABLET | ORAL | Status: DC
  Administered 2014-06-25 – 2014-06-27 (×3): 2 via ORAL
  Filled 2014-06-25 (×3): qty 2

## 2014-06-25 MED ORDER — MORPHINE SULFATE (PF) 0.5 MG/ML IJ SOLN
INTRAMUSCULAR | Status: DC | PRN
  Administered 2014-06-25: .15 mg via EPIDURAL

## 2014-06-25 MED ORDER — INSULIN GLARGINE 100 UNIT/ML ~~LOC~~ SOLN
6.0000 [IU] | Freq: Every day | SUBCUTANEOUS | Status: DC
  Administered 2014-06-25 – 2014-06-28 (×4): 6 [IU] via SUBCUTANEOUS
  Filled 2014-06-25 (×4): qty 0.06

## 2014-06-25 MED ORDER — ONDANSETRON HCL 4 MG/2ML IJ SOLN
4.0000 mg | INTRAMUSCULAR | Status: DC | PRN
Start: 2014-06-25 — End: 2014-06-28

## 2014-06-25 MED ORDER — PHENYLEPHRINE 8 MG IN D5W 100 ML (0.08MG/ML) PREMIX OPTIME
INJECTION | INTRAVENOUS | Status: AC
  Filled 2014-06-25: qty 100

## 2014-06-25 MED ORDER — TETANUS-DIPHTH-ACELL PERTUSSIS 5-2.5-18.5 LF-MCG/0.5 IM SUSP: 0.5000 mL | Freq: Once | INTRAMUSCULAR | Status: DC

## 2014-06-25 MED ORDER — SIMETHICONE 80 MG PO CHEW
80.0000 mg | CHEWABLE_TABLET | ORAL | Status: DC
  Administered 2014-06-25 – 2014-06-27 (×3): 80 mg via ORAL
  Filled 2014-06-25 (×3): qty 1

## 2014-06-25 MED ORDER — WITCH HAZEL-GLYCERIN EX PADS: 1.0000 "application " | MEDICATED_PAD | CUTANEOUS | Status: DC | PRN

## 2014-06-25 MED ORDER — MEPERIDINE HCL 25 MG/ML IJ SOLN: 6.2500 mg | INTRAMUSCULAR | Status: DC | PRN

## 2014-06-25 MED ORDER — DEXTROSE IN LACTATED RINGERS 5 % IV SOLN: INTRAVENOUS | Status: DC

## 2014-06-25 MED ORDER — SIMETHICONE 80 MG PO CHEW: 80.0000 mg | CHEWABLE_TABLET | ORAL | Status: DC | PRN

## 2014-06-25 MED ORDER — ONDANSETRON HCL 4 MG PO TABS: 4.0000 mg | ORAL_TABLET | ORAL | Status: DC | PRN

## 2014-06-25 MED ORDER — OXYTOCIN 10 UNIT/ML IJ SOLN
INTRAMUSCULAR | Status: AC
  Filled 2014-06-25: qty 4

## 2014-06-25 MED ORDER — CEFAZOLIN SODIUM-DEXTROSE 2-3 GM-% IV SOLR
INTRAVENOUS | Status: AC
  Filled 2014-06-25: qty 50

## 2014-06-25 MED ORDER — NALBUPHINE HCL 10 MG/ML IJ SOLN: 5.0000 mg | INTRAMUSCULAR | Status: DC | PRN

## 2014-06-25 MED ORDER — IBUPROFEN 600 MG PO TABS
600.0000 mg | ORAL_TABLET | Freq: Four times a day (QID) | ORAL | Status: DC
  Administered 2014-06-25 – 2014-06-28 (×12): 600 mg via ORAL
  Filled 2014-06-25 (×12): qty 1

## 2014-06-25 MED ORDER — LACTATED RINGERS IV SOLN
INTRAVENOUS | Status: DC | PRN
  Administered 2014-06-25 (×3): via INTRAVENOUS

## 2014-06-25 MED ORDER — DIPHENHYDRAMINE HCL 25 MG PO CAPS: 25.0000 mg | ORAL_CAPSULE | Freq: Four times a day (QID) | ORAL | Status: DC | PRN

## 2014-06-25 MED ORDER — FENTANYL CITRATE 0.05 MG/ML IJ SOLN
INTRAMUSCULAR | Status: DC | PRN
  Administered 2014-06-25: 25 ug via INTRAVENOUS

## 2014-06-25 MED ORDER — OXYCODONE-ACETAMINOPHEN 5-325 MG PO TABS
2.0000 | ORAL_TABLET | ORAL | Status: DC | PRN
  Administered 2014-06-28: 2 via ORAL
  Filled 2014-06-25: qty 2

## 2014-06-25 MED ORDER — LACTATED RINGERS IV SOLN
INTRAVENOUS | Status: DC | PRN
  Administered 2014-06-25: 15:00:00 via INTRAVENOUS

## 2014-06-25 MED ORDER — ACETAMINOPHEN 160 MG/5ML PO SOLN: 325.0000 mg | ORAL | Status: DC | PRN

## 2014-06-25 MED ORDER — ONDANSETRON HCL 4 MG/2ML IJ SOLN
INTRAMUSCULAR | Status: DC | PRN
  Administered 2014-06-25: 4 mg via INTRAVENOUS

## 2014-06-25 MED ORDER — KETOROLAC TROMETHAMINE 30 MG/ML IJ SOLN
INTRAMUSCULAR | Status: AC
  Administered 2014-06-25: 30 mg via INTRAMUSCULAR
  Filled 2014-06-25: qty 1

## 2014-06-25 MED ORDER — PRENATAL MULTIVITAMIN CH
1.0000 | ORAL_TABLET | Freq: Every day | ORAL | Status: DC
  Administered 2014-06-26 – 2014-06-28 (×4): 1 via ORAL
  Filled 2014-06-25 (×3): qty 1

## 2014-06-25 MED ORDER — FENTANYL CITRATE 0.05 MG/ML IJ SOLN
25.0000 ug | INTRAMUSCULAR | Status: DC | PRN
  Administered 2014-06-25: 50 ug via INTRAVENOUS

## 2014-06-25 MED ORDER — SCOPOLAMINE 1 MG/3DAYS TD PT72
MEDICATED_PATCH | TRANSDERMAL | Status: AC
  Administered 2014-06-25: 1.5 mg via TRANSDERMAL
  Filled 2014-06-25: qty 1

## 2014-06-25 MED ORDER — DIBUCAINE 1 % RE OINT
1.0000 | TOPICAL_OINTMENT | RECTAL | Status: DC | PRN
Start: 2014-06-25 — End: 2014-06-28

## 2014-06-25 MED ORDER — KETOROLAC TROMETHAMINE 30 MG/ML IJ SOLN: 30.0000 mg | Freq: Once | INTRAMUSCULAR | Status: DC | PRN

## 2014-06-25 MED ORDER — LACTATED RINGERS IV SOLN
Freq: Once | INTRAVENOUS | Status: AC
  Administered 2014-06-25: 14:00:00 via INTRAVENOUS

## 2014-06-25 MED ORDER — MIDAZOLAM HCL 2 MG/2ML IJ SOLN: 0.5000 mg | Freq: Once | INTRAMUSCULAR | Status: DC | PRN

## 2014-06-25 MED ORDER — 0.9 % SODIUM CHLORIDE (POUR BTL) OPTIME
TOPICAL | Status: DC | PRN
  Administered 2014-06-25: 1000 mL

## 2014-06-25 MED ORDER — CEFAZOLIN SODIUM-DEXTROSE 2-3 GM-% IV SOLR
2.0000 g | INTRAVENOUS | Status: AC
  Administered 2014-06-25: 2 g via INTRAVENOUS

## 2014-06-25 MED ORDER — MORPHINE SULFATE 0.5 MG/ML IJ SOLN
INTRAMUSCULAR | Status: AC
  Filled 2014-06-25: qty 10

## 2014-06-25 MED ORDER — KETOROLAC TROMETHAMINE 30 MG/ML IJ SOLN: 30.0000 mg | Freq: Four times a day (QID) | INTRAMUSCULAR | Status: DC | PRN

## 2014-06-25 MED ORDER — LANOLIN HYDROUS EX OINT: 1.0000 "application " | TOPICAL_OINTMENT | CUTANEOUS | Status: DC | PRN

## 2014-06-25 MED ORDER — KETOROLAC TROMETHAMINE 30 MG/ML IJ SOLN
30.0000 mg | Freq: Four times a day (QID) | INTRAMUSCULAR | Status: DC | PRN
  Administered 2014-06-25: 30 mg via INTRAMUSCULAR

## 2014-06-25 MED ORDER — OXYTOCIN 40 UNITS IN LACTATED RINGERS INFUSION - SIMPLE MED: 62.5000 mL/h | INTRAVENOUS | Status: AC

## 2014-06-25 MED ORDER — SIMETHICONE 80 MG PO CHEW
80.0000 mg | CHEWABLE_TABLET | Freq: Three times a day (TID) | ORAL | Status: DC
  Administered 2014-06-26 – 2014-06-28 (×7): 80 mg via ORAL
  Filled 2014-06-25 (×7): qty 1

## 2014-06-25 MED ORDER — LACTATED RINGERS IV SOLN
INTRAVENOUS | Status: DC
  Administered 2014-06-26: 02:00:00 via INTRAVENOUS

## 2014-06-25 MED ORDER — ACETAMINOPHEN 325 MG PO TABS: 325.0000 mg | ORAL_TABLET | ORAL | Status: DC | PRN

## 2014-06-25 MED ORDER — OXYTOCIN 10 UNIT/ML IJ SOLN
40.0000 [IU] | INTRAVENOUS | Status: DC | PRN
  Administered 2014-06-25: 40 [IU] via INTRAVENOUS

## 2014-06-25 MED ORDER — SCOPOLAMINE 1 MG/3DAYS TD PT72
1.0000 | MEDICATED_PATCH | Freq: Once | TRANSDERMAL | Status: DC
  Administered 2014-06-25: 1.5 mg via TRANSDERMAL

## 2014-06-25 SURGICAL SUPPLY — 38 items
APL SKNCLS STERI-STRIP NONHPOA (GAUZE/BANDAGES/DRESSINGS) ×2
BENZOIN TINCTURE PRP APPL 2/3 (GAUZE/BANDAGES/DRESSINGS) ×4 IMPLANT
CLAMP CORD UMBIL (MISCELLANEOUS) IMPLANT
CLOSURE WOUND 1/2 X4 (GAUZE/BANDAGES/DRESSINGS) ×1
CLOTH BEACON ORANGE TIMEOUT ST (SAFETY) ×4 IMPLANT
CONTAINER PREFILL 10% NBF 15ML (MISCELLANEOUS) IMPLANT
DRAPE SHEET LG 3/4 BI-LAMINATE (DRAPES) IMPLANT
DRSG OPSITE POSTOP 4X10 (GAUZE/BANDAGES/DRESSINGS) ×4 IMPLANT
DURAPREP 26ML APPLICATOR (WOUND CARE) ×4 IMPLANT
ELECT REM PT RETURN 9FT ADLT (ELECTROSURGICAL) ×4
ELECTRODE REM PT RTRN 9FT ADLT (ELECTROSURGICAL) ×2 IMPLANT
EXTRACTOR VACUUM M CUP 4 TUBE (SUCTIONS) IMPLANT
EXTRACTOR VACUUM M CUP 4' TUBE (SUCTIONS)
GLOVE BIO SURGEON STRL SZ7.5 (GLOVE) ×4 IMPLANT
GLOVE BIOGEL PI IND STRL 7.5 (GLOVE) ×2 IMPLANT
GLOVE BIOGEL PI INDICATOR 7.5 (GLOVE) ×2
GOWN STRL REUS W/TWL LRG LVL3 (GOWN DISPOSABLE) ×8 IMPLANT
HEMOSTAT SURGICEL 2X3 (HEMOSTASIS) ×4 IMPLANT
KIT ABG SYR 3ML LUER SLIP (SYRINGE) IMPLANT
NEEDLE HYPO 25X5/8 SAFETYGLIDE (NEEDLE) IMPLANT
NS IRRIG 1000ML POUR BTL (IV SOLUTION) ×4 IMPLANT
PACK C SECTION WH (CUSTOM PROCEDURE TRAY) ×4 IMPLANT
PAD OB MATERNITY 4.3X12.25 (PERSONAL CARE ITEMS) ×4 IMPLANT
RETRACTOR WND ALEXIS 25 LRG (MISCELLANEOUS) ×2 IMPLANT
RTRCTR C-SECT PINK 25CM LRG (MISCELLANEOUS) ×4 IMPLANT
RTRCTR WOUND ALEXIS 25CM LRG (MISCELLANEOUS) ×4
STRIP CLOSURE SKIN 1/2X4 (GAUZE/BANDAGES/DRESSINGS) ×3 IMPLANT
SUT CHROMIC 2 0 CT 1 (SUTURE) ×4 IMPLANT
SUT MNCRL AB 3-0 PS2 27 (SUTURE) ×4 IMPLANT
SUT PLAIN 0 NONE (SUTURE) IMPLANT
SUT PLAIN 2 0 XLH (SUTURE) ×4 IMPLANT
SUT VIC AB 0 CT1 36 (SUTURE) ×8 IMPLANT
SUT VIC AB 0 CTX 36 (SUTURE) ×12
SUT VIC AB 0 CTX36XBRD ANBCTRL (SUTURE) ×6 IMPLANT
SUT VIC AB 3-0 SH 27 (SUTURE) ×4
SUT VIC AB 3-0 SH 27X BRD (SUTURE) ×2 IMPLANT
TOWEL OR 17X24 6PK STRL BLUE (TOWEL DISPOSABLE) ×4 IMPLANT
TRAY FOLEY CATH 14FR (SET/KITS/TRAYS/PACK) ×4 IMPLANT

## 2014-06-25 NOTE — Transfer of Care (Signed)
Immediate Anesthesia Transfer of Care Note  Patient: Kathryn Howard  Procedure(s) Performed: Procedure(s): REPEAT CESAREAN SECTION (N/A) REMOVAL OF CERCLAGE CERVICAL (N/A)  Patient Location: PACU  Anesthesia Type:Spinal  Level of Consciousness: awake, alert  and oriented  Airway & Oxygen Therapy: Patient Spontanous Breathing  Post-op Assessment: Report given to RN and Post -op Vital signs reviewed and stable  Post vital signs: Reviewed and stable  Last Vitals:  Filed Vitals:   06/25/14 1248  BP: 104/69  Pulse: 91  Temp: 36.6 C  Resp: 16    Complications: No apparent anesthesia complications

## 2014-06-25 NOTE — Op Note (Signed)
Cesarean Section Procedure Note  Indications: 37wks with h/o classical c-section and incompetent cervix  Pre-operative Diagnosis: 1.Previous CLASSICAL C-Section 2.CERVICAL CERCLAGE   Post-operative Diagnosis: 1.Previous CLASSICAL C-Section 2.CERVICAL CERCLAGE REMOVAL  Procedure: REPEAT CESAREAN SECTION  Surgeon: [name], MD    Assistants: Henreitta Leber, PA-C  Anesthesia: Spinal  Anesthesiologist: Brayton Caves, MD   Procedure Details  The patient was taken to the operating room secondary to h/o classical c-section and incompetent cervix after the risks, benefits, complications, treatment options, and expected outcomes were discussed with the patient.  The patient concurred with the proposed plan, giving informed consent which was signed and witnessed. The patient was taken to Operating Room C-Section Suite, identified as Kathryn Howard and the procedure verified as C-Section Delivery. A Time Out was held and the above information confirmed.  After induction of anesthesia by obtaining a spinal, the patient was prepped and draped in the usual sterile manner. A Pfannenstiel skin incision was made and carried down through the subcutaneous tissue to the underlying layer of fascia.  The fascia was incised bilaterally and extended transversely bilaterally with the Mayo scissors. Kocher clamps were placed on the inferior aspect of the fascial incision and the underlying rectus muscle was separated from the fascia. The same was done on the superior aspect of the fascial incision.  The peritoneum was identified, entered bluntly and extended manually.  Omental adhesions to anterior abdominal wall were note and excised.  An Alexis self-retaining retractor was placed.  The utero-vesical peritoneal reflection was incised transversely and the bladder flap was bluntly freed from the lower uterine segment. A low transverse uterine incision was made with the scalpel and extended bilaterally with the  bandage scissors.  The infant was delivered in vertex position without difficulty.  After the umbilical cord was clamped and cut, the infant was handed to the awaiting pediatricians.  Cord blood was obtained for evaluation.  The placenta was removed intact and appeared to be within normal limits. The uterus was cleared of all clots and debris. The uterine incision was closed with running interlocking sutures of 0 Vicryl and a second imbricating layer was performed as well.   Bilateral tubes and ovaries appeared to be within normal limits.  The right fallopian tube was adherent to the anterior uterine wall and adhesions were excised and fallopian tube freed with a small portion of uterine serosa remained adherent.  Good hemostasis was noted.  The uterine wall in this area was reinforced with 3-0 vicryl via a running interlocking stitch.  Copious irrigation was performed until clear.  The peritoneum was repaired with 2-0 chromic via a running suture.  The fascia was reapproximated with a running suture of 0 Vicryl. The subcutaneous tissue was reapproximated with 3 interrupted sutures of 2-0 plain.  The skin was reapproximated with a subcuticular suture of 3-0 monocryl.  Steristrips were applied.  Instrument, sponge, and needle counts were correct prior to abdominal closure and at the conclusion of the case.  The patient was awaiting transfer to the recovery room in good condition.  Findings: Live female infant with Apgars 7 at one minute and 8 at five minutes.  Normal appearing bilateral ovaries and fallopian tubes were noted.  Estimated Blood Loss:  700 ml         Drains: foley to gravity 200 cc         Total IV Fluids: 3700 ml         Specimens to Pathology: None (Placenta sent to Birth  Tissue Recovery Program)         Complications:  None; patient tolerated the procedure well.         Disposition: PACU - hemodynamically stable.         Condition: stable  Attending Attestation: I performed the  procedure.

## 2014-06-25 NOTE — Anesthesia Procedure Notes (Signed)
Spinal Patient location during procedure: OR Start time: 06/25/2014 2:17 PM Staffing Anesthesiologist: Brayton CavesJACKSON, Ioma Chismar Performed by: anesthesiologist  Preanesthetic Checklist Completed: patient identified, site marked, surgical consent, pre-op evaluation, timeout performed, IV checked, risks and benefits discussed and monitors and equipment checked Spinal Block Patient position: sitting Prep: DuraPrep Patient monitoring: heart rate, cardiac monitor, continuous pulse ox and blood pressure Approach: midline Location: L3-4 Injection technique: single-shot Needle Needle type: Sprotte  Needle gauge: 24 G Needle length: 9 cm Assessment Sensory level: T4 Additional Notes Patient identified.  Risk benefits discussed including failed block, incomplete pain control, headache, nerve damage, paralysis, blood pressure changes, nausea, vomiting, reactions to medication both toxic or allergic, and postpartum back pain.  Patient expressed understanding and wished to proceed.  All questions were answered.  Sterile technique used throughout procedure.  CSF was clear.  No parasthesia or other complications.  Please see nursing notes for vital signs.

## 2014-06-25 NOTE — Anesthesia Postprocedure Evaluation (Signed)
Anesthesia Post Note  Patient: Kathryn Howard  Procedure(s) Performed: Procedure(s) (LRB): REPEAT CESAREAN SECTION (N/A) REMOVAL OF CERCLAGE CERVICAL (N/A)  Anesthesia type: Spinal  Patient location: PACU  Post pain: Pain level controlled  Post assessment: Post-op Vital signs reviewed  Last Vitals:  Filed Vitals:   06/25/14 1248  BP: 104/69  Pulse: 91  Temp: 36.6 C  Resp: 16    Post vital signs: Reviewed  Level of consciousness: awake  Complications: No apparent anesthesia complications

## 2014-06-25 NOTE — Anesthesia Preprocedure Evaluation (Signed)
Anesthesia Evaluation  Patient identified by MRN, date of birth, ID band Patient awake    Reviewed: Allergy & Precautions, H&P , NPO status , Patient's Chart, lab work & pertinent test results  Airway Mallampati: II       Dental   Pulmonary  breath sounds clear to auscultation        Cardiovascular Exercise Tolerance: Good Rhythm:regular Rate:Normal     Neuro/Psych  Headaches, PSYCHIATRIC DISORDERS Anxiety Depression    GI/Hepatic   Endo/Other  diabetesHypothyroidism Morbid obesity  Renal/GU      Musculoskeletal   Abdominal   Peds  Hematology  (+) anemia ,   Anesthesia Other Findings   Reproductive/Obstetrics (+) Pregnancy                             Anesthesia Physical Anesthesia Plan  ASA: III  Anesthesia Plan: Spinal   Post-op Pain Management:    Induction:   Airway Management Planned:   Additional Equipment:   Intra-op Plan:   Post-operative Plan:   Informed Consent: I have reviewed the patients History and Physical, chart, labs and discussed the procedure including the risks, benefits and alternatives for the proposed anesthesia with the patient or authorized representative who has indicated his/her understanding and acceptance.     Plan Discussed with: Anesthesiologist, CRNA and Surgeon  Anesthesia Plan Comments:         Anesthesia Quick Evaluation

## 2014-06-25 NOTE — Lactation Note (Signed)
This note was copied from the chart of Kathryn Howard. Lactation Consultation Note Initial visit at 7 hours of age.  Baby is 37w and 5#5oz.  Mom has a history of PCOS and has flat nipples.  Baby has not had a feeding yet.  Attempted to latch baby, but nipples are semi flat and baby does not open mouth.  Baby sucks gloved finger welll, but does not open mouth for latch.  Attempted hand expression for minutes with only a glisten of colostrum noted.  Hand pump given to assist with nipple eversion at feeding attempts.  DEBP set up with instructions on use and cleaning.  Encouraged mom to pump every 3 hours on preemie setting for 15 minutes.  Encouraged mom to feed on demand with early cues and to wake baby for STS every 3 hours.  Green information sheet for LPI discussed with mom.  Encouraged to offer alimentum supplementation at this time, and mom agrees and wants baby to be fed.  Attempted foley cup feeding,  Baby licked from foley cup, but didn't swallow.  Instructed mom on syringe feeding with gloved finger.  Baby sucked well and took 10mls and burped after.  Mom has long nails with henna tattoos on her finger, mom is unsure of syringe feedings.  FOB is leaving for the night and her mom/ support person is now asleep on the couch.  Discussed benefits of not using a bottle nipple and mom agrees.  Mom will have to find what feeding method works for her.  [facility] resources given and discussed.  Early newborn behavior discussed. [facility] and nursery nurse at bedside.  Mom to call for assist as needed.       Patient Name: Kathryn Chipper HerbHiba Breault KGMWN'UToday's Date: 06/25/2014 Reason for consult: Initial assessment;Infant < 6lbs   Maternal Data Has patient been taught Hand Expression?: Yes Does the patient have breastfeeding experience prior to this delivery?: No  Feeding Feeding Type: Breast Fed  LATCH Score/Interventions Latch: Too sleepy or reluctant, no latch achieved, no sucking  elicited. Intervention(s): Teach feeding cues;Waking techniques  Audible Swallowing: None Intervention(s): Skin to skin;Hand expression  Type of Nipple: Flat Intervention(s): Hand pump;Double electric pump  Comfort (Breast/Nipple): Soft / non-tender     Hold (Positioning): Assistance needed to correctly position infant at breast and maintain latch. Intervention(s): Skin to skin;Position options;Support Pillows;Breastfeeding basics reviewed  LATCH Score: 4  Lactation Tools Discussed/Used Pump Review: Setup, frequency, and cleaning Initiated by:: JS Date initiated:: 06/25/14   Consult Status Consult Status: Follow-up Date: 06/26/14 Follow-up type: In-patient    Beverely RisenShoptaw, Arvella MerlesJana Lynn 06/25/2014, 9:58 PM

## 2014-06-25 NOTE — H&P (Addendum)
Kathryn Howard is a 39 y.o. female presenting for scheduled repeat c-section and removal of cerclage secondary to h/o classical c-section with incompetent cervix.  Pt received 17P throughout the pregnancy.  History OB History    Gravida Para Term Preterm AB TAB SAB Ectopic Multiple Living   3 1 0 1 1 0 1 0 0 0      Past Medical History  Diagnosis Date  . Vitamin D deficiency   . Obesity   . Gastroparesis   . Fatty liver   . DUB (dysfunctional uterine bleeding)   . H pylori ulcer   . H/O rubella   . H/O varicella   . Depression     stopped meds 2012  . History of anxiety   . History of PCOS 03/07/10  . Oligomenorrhea 09/12/10  . Pelvic pain 03/14/11    right sided back  . H/O hematuria 03/14/11  . History of ovarian cyst   . Yeast infection   . Diabetes mellitus     type 2  . Hypothyroidism   . H/O seasonal allergies   . Anxiety     no meds  . Headache     otc med prn  . Anemia   . History of blood transfusion 09/2011    [facility] - 2 units transfused   Past Surgical History  Procedure Laterality Date  . Cervical cerclage  09/15/2011    Procedure: CERCLAGE CERVICAL;  Surgeon: Kirkland HunArthur Stringer, MD;  Location: [facility] ORS;  Service: Gynecology;  Laterality: N/A;  . Cesarean section  10/18/2011    Procedure: CESAREAN SECTION;  Surgeon: Purcell NailsAngela Y Ashiyah Pavlak, MD;  Location: [facility] ORS;  Service: Gynecology;  Laterality: N/A;  . Cervical cerclage N/A 12/31/2013    Procedure: CERCLAGE CERVICAL;  Surgeon: Kirkland HunArthur Stringer, MD;  Location: [facility]S;  Service: Gynecology;  Laterality: N/A;  . Dilation and curettage of uterus  2008    MAB   Family History: family history includes Diabetes in her father and mother; Hyperlipidemia in her mother; Hypertension in her father and mother. Social History:  reports that she has never smoked. She has never used smokeless tobacco. She reports that she does not drink alcohol or use illicit drugs.   Prenatal Transfer Tool  Maternal Diabetes: Yes:  Diabetes Type:   Insulin/Medication controlled Genetic Screening: Declined Amnio after Panorama results were unable to be done Maternal Ultrasounds/Referrals: Normal Fetal Ultrasounds or other Referrals:  Fetal echo wnl Maternal Substance Abuse:  No Significant Maternal Medications:  Meds include: Other: insulin and metformin Significant Maternal Lab Results:  Lab values include: Group B Strep positive Other Comments:  None  ROS Non-contributory   Blood pressure 104/69, pulse 91, temperature 97.9 F (36.6 C), temperature source Oral, resp. rate 16, last menstrual period 04/26/2013, SpO2 100 %. Exam Physical Exam  Lungs CTA CV RRR Abd gravid, NT Ext SCDs are on   Prenatal labs: ABO, Rh: --/--/O POS (02/23 1447) Antibody: NEG (02/23 1447) Rubella: Immune (08/19 0000) RPR: Non Reactive (02/23 1447)  HBsAg: Negative (08/19 0000)  HIV: Non-reactive (08/19 0000)  GBS:   positive  Assessment/Plan: P0 at 37 wks with h/o classical c-section and incompetent cervix scheduled for repeat c-section and removal of cerclage.  R/B/A discussed including but not limited to bleeding, infection and injury and consent signed and witnessed.   Purcell NailsROBERTS,Orren Pietsch Y 06/25/2014, 1:33 PM

## 2014-06-26 ENCOUNTER — Encounter (HOSPITAL_COMMUNITY): Payer: Self-pay | Admitting: Obstetrics and Gynecology

## 2014-06-26 LAB — BIRTH TISSUE RECOVERY COLLECTION (PLACENTA DONATION)

## 2014-06-26 LAB — CBC
HEMATOCRIT: 30.6 % — AB (ref 36.0–46.0)
Hemoglobin: 9.9 g/dL — ABNORMAL LOW (ref 12.0–15.0)
MCH: 24.5 pg — ABNORMAL LOW (ref 26.0–34.0)
MCHC: 32.4 g/dL (ref 30.0–36.0)
MCV: 75.7 fL — ABNORMAL LOW (ref 78.0–100.0)
Platelets: 205 10*3/uL (ref 150–400)
RBC: 4.04 MIL/uL (ref 3.87–5.11)
RDW: 16.4 % — AB (ref 11.5–15.5)
WBC: 8 10*3/uL (ref 4.0–10.5)

## 2014-06-26 LAB — GLUCOSE, CAPILLARY
GLUCOSE-CAPILLARY: 171 mg/dL — AB (ref 70–99)
Glucose-Capillary: 166 mg/dL — ABNORMAL HIGH (ref 70–99)
Glucose-Capillary: 119 mg/dL — ABNORMAL HIGH (ref 70–99)
Glucose-Capillary: 203 mg/dL — ABNORMAL HIGH (ref 70–99)
Glucose-Capillary: 124 mg/dL — ABNORMAL HIGH (ref 70–99)

## 2014-06-26 MED ORDER — IBUPROFEN 600 MG PO TABS: 600.0000 mg | ORAL_TABLET | Freq: Four times a day (QID) | ORAL | Status: DC | PRN

## 2014-06-26 MED ORDER — DIPHENHYDRAMINE HCL 25 MG PO CAPS: 25.0000 mg | ORAL_CAPSULE | ORAL | Status: DC | PRN

## 2014-06-26 MED ORDER — NALBUPHINE HCL 10 MG/ML IJ SOLN: 5.0000 mg | Freq: Once | INTRAMUSCULAR | Status: AC | PRN

## 2014-06-26 MED ORDER — ACETAMINOPHEN 500 MG PO TABS
1000.0000 mg | ORAL_TABLET | Freq: Four times a day (QID) | ORAL | Status: AC
  Administered 2014-06-26 (×3): 1000 mg via ORAL
  Filled 2014-06-26 (×3): qty 2

## 2014-06-26 MED ORDER — NALOXONE HCL 0.4 MG/ML IJ SOLN: 0.4000 mg | INTRAMUSCULAR | Status: DC | PRN

## 2014-06-26 MED ORDER — SCOPOLAMINE 1 MG/3DAYS TD PT72
1.0000 | MEDICATED_PATCH | Freq: Once | TRANSDERMAL | Status: DC
  Filled 2014-06-26: qty 1

## 2014-06-26 MED ORDER — DIPHENHYDRAMINE HCL 50 MG/ML IJ SOLN: 12.5000 mg | INTRAMUSCULAR | Status: DC | PRN

## 2014-06-26 MED ORDER — ONDANSETRON HCL 4 MG/2ML IJ SOLN: 4.0000 mg | Freq: Three times a day (TID) | INTRAMUSCULAR | Status: DC | PRN

## 2014-06-26 MED ORDER — SODIUM CHLORIDE 0.9 % IJ SOLN: 3.0000 mL | INTRAMUSCULAR | Status: DC | PRN

## 2014-06-26 MED ORDER — DEXTROSE 5 % IV SOLN
1.0000 ug/kg/h | INTRAVENOUS | Status: DC | PRN
  Filled 2014-06-26: qty 2

## 2014-06-26 NOTE — Addendum Note (Signed)
Addendum  created 06/26/14 [id number] by Jhonnie GarnerBeth M Taesean Reth, CRNA   Modules edited: Notes Section   Notes Section:  File: [id number]

## 2014-06-26 NOTE — Progress Notes (Signed)
Pt had CBG at 1500 Resulted 173

## 2014-06-26 NOTE — Anesthesia Postprocedure Evaluation (Signed)
Anesthesia Post Note  Patient: Kathryn Howard  Procedure(s) Performed: Procedure(s) (LRB): REPEAT CESAREAN SECTION (N/A) REMOVAL OF CERCLAGE CERVICAL (N/A)  Anesthesia type: SAB  Patient location: Mother/Baby  Post pain: Pain level controlled  Post assessment: Post-op Vital signs reviewed  Last Vitals:  Filed Vitals:   06/26/14 0500  BP: 100/55  Pulse: 94  Temp: 36.9 C  Resp: 18    Post vital signs: Reviewed  Level of consciousness: awake  Complications: No apparent anesthesia complications

## 2014-06-26 NOTE — Progress Notes (Signed)
Subjective: Postpartum Day 1: Cesarean Delivery due to repeat with a h/o classical c-section and incompetent cervix Patient up ad lib, reports no syncope or dizziness. Feeding:  breastfeeding Contraceptive plan:  unsure  Objective: Vital signs in last 24 hours: Temp:  [97.4 F (36.3 C)-99 F (37.2 C)] 97.7 F (36.5 C) (02/26 0900) Pulse Rate:  [67-126] 78 (02/26 0900) Resp:  [11-35] 16 (02/26 0900) BP: (92-118)/(42-84) 92/42 mmHg (02/26 0900) SpO2:  [98 %-100 %] 100 % (02/26 0900)  Physical Exam:  General: alert and cooperative Lochia: appropriate Uterine Fundus: firm Abdomen:  + bowel sounds, non distended Incision: healing well  Honeycomb dressing CDI DVT Evaluation: No evidence of DVT seen on physical exam. Homan's sign: Negative   Recent Labs  06/23/14 1447 06/26/14 0735  HGB 10.9* 9.9*  HCT 33.1* 30.6*  WBC 10.9* 8.0     Assessment: Status post Cesarean section day 1. Doing well postoperatively.  Honeycomb dressing in place, no significant drainage Anemia - hemodynamicly stable.    Plan: Continue current care. Breastfeeding and Lactation consult    Kathryn Howard, CNM, MSN 06/26/2014. 2:36 PM

## 2014-06-26 NOTE — Progress Notes (Signed)
Blood Sugars performed at 0821 resulted at 119; performed at 1109 resulted at 203

## 2014-06-26 NOTE — Lactation Note (Signed)
This note was copied from the chart of Kathryn Thomasene Stupka. Lactation Consultation Note  Patient Name: Kathryn Howard ZOXWR'UToday's Date: 06/26/2014 Reason for consult: Follow-up assessment   With this mom of a NICU baby, now 37 1/7 weeks CGA, and 23 hours old. The baby is tachypneac and very sleepy - not showing any feeding cues. 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 At this time, I was not able to express colostrum from mom. I assisted mom with trying to latch the baby, but she was very sleepy, and just content doing skin to skin with mom. Mom expressed her sadness at having her baby separated from her, and her fear of having to go home without the baby. Mom is hoping the baby will bottle feed  - anything to show she is doing well and can go home. I reviewed with mom what parameter the baby would have to meet to be discharged, and also reviewed the NICU booklet and lactation services with mom. Mom knows to call for questions/concerns.    Maternal Data    Feeding Feeding Type: Breast Fed  LATCH Score/Interventions Latch: Too sleepy or reluctant, no latch achieved, no sucking elicited. Intervention(s): Skin to skin                    Lactation Tools Discussed/Used Pump Review: Setup, frequency, and cleaning;Milk Storage;Other (comment) (premie setting, hand expression) Date initiated:: 06/25/14   Consult Status Consult Status: Follow-up Date: 06/27/14 Follow-up type: In-patient    Alfred LevinsLee, Quanna Wittke Anne 06/26/2014, 2:46 PM

## 2014-06-27 LAB — GLUCOSE, CAPILLARY
GLUCOSE-CAPILLARY: 92 mg/dL (ref 70–99)
Glucose-Capillary: 140 mg/dL — ABNORMAL HIGH (ref 70–99)
Glucose-Capillary: 134 mg/dL — ABNORMAL HIGH (ref 70–99)
Glucose-Capillary: 113 mg/dL — ABNORMAL HIGH (ref 70–99)

## 2014-06-27 LAB — TYPE AND SCREEN
ABO/RH(D): O POS
ANTIBODY SCREEN: NEGATIVE
UNIT DIVISION: 0
UNIT DIVISION: 0

## 2014-06-27 NOTE — Progress Notes (Signed)
Kathryn Howard [id number] Postpartum Day 2 S/P Repeat Medical Cesarean Section due to H/O Classical Uterine Incision and Incompetent Cervix  Subjective: Patient up ad lib, denies syncope or dizziness. Reports consuming regular diet without issues and denies N/V. Patient reports negative bowel movement and is passing flatus.  Denies issues with urination and reports bleeding is "all good."  Patient is pumping and bottle feeding as infant is currently in NICU for respiratory issues.  Desires for postpartum contraception not addressed.  Pain is being appropriately managed with use of percocet and ibuprofen.   Objective: Temp:  [97.6 F (36.4 C)-98.3 F (36.8 C)] 98 F (36.7 C) (02/27 [id number]) Pulse Rate:  [65-87] 79 (02/27 0613) Resp:  [16-18] 18 (02/27 0613) BP: (102-115)/(46-56) 110/56 mmHg (02/27 0613) SpO2:  [98 %-100 %] 99 % (02/27 0613)   Recent Labs  06/26/14 0735  HGB 9.9*  HCT 30.6*  WBC 8.0    Physical Exam:  General: alert, cooperative and no distress Mood/Affect: Appropriate/Bright Lungs: clear to auscultation, no wheezes, rales or rhonchi, symmetric air entry.  Heart: normal rate and regular rhythm. Abdomen:  + bowel sounds, Appropriately Tender, Minimal Distention Incision: no significant drainage on Honeycomb dressing  Uterine Fundus: firm at U/-1 Lochia: appropriate Skin: Warm, Dry. DVT Evaluation: No evidence of DVT seen on physical exam. JP drain:   None  Assessment Post Operative Day 1 S/P Repeat C/S Normal Involution Hemodynamically Stable  Plan: -Questions regarding discharge tomorrow as infant is in NICU -Patient instructed to speak with NICU staff regarding infant discharge and possible in hospital stay -No other issues, questions or concerns -Continue other mgmt as ordered -Dr. Carroll SageE. Varnado to be updated on patient status  [name], [name] MSN, CNM 06/27/2014, 9:46 AM

## 2014-06-27 NOTE — Lactation Note (Signed)
This note was copied from the chart of Kathryn Raeana Marines. Lactation Consultation Note  Patient Name: Kathryn Howard ZOXWR'UToday's Date: 06/27/2014  Baby 54 hours of life. [facility]. Mom concerned that her milk is not coming in and she is not seeing colostrum. Mom states that she saw a few drops of colostrum yesterday, but none since. Mom reports that she has only pumped twice today. Discussed the need to stimulate breast by using DEBP at least every 3 hours/8 times a day. Discussed that the pump is substituting for having the baby at the breast. Discussed pumping after visiting baby, and mom aware of pumping rooms in NICU. Enc mom to call [employer] insurance tomorrow to discuss DEBP for home, and discussed [facility] 2-week rental pump as well. Enc mom that milk can take 3-5 or even more days to come in. Enc mom to keep pumping, and to hand express afterwards. Mom states that even with her PCOS, her milk did come in after her early-term IUFD. Discussed with mom that this is a good sign, but no guarantee. Enc her to focus on being with baby, enjoying STS with the baby as able, and then pumping every 3 hours.    Maternal Data    Feeding Feeding Type: Formula Nipple Type: Slow - flow Length of feed: 30 min  LATCH Score/Interventions                      Lactation Tools Discussed/Used     Consult Status      Geralynn OchsWILLIARD, [name] 06/27/2014, 9:34 PM

## 2014-06-28 LAB — GLUCOSE, CAPILLARY
GLUCOSE-CAPILLARY: 81 mg/dL (ref 70–99)
GLUCOSE-CAPILLARY: 118 mg/dL — AB (ref 70–99)

## 2014-06-28 MED ORDER — OXYCODONE-ACETAMINOPHEN 5-325 MG PO TABS: 1.0000 | ORAL_TABLET | ORAL | Status: DC | PRN

## 2014-06-28 MED ORDER — SENNOSIDES-DOCUSATE SODIUM 8.6-50 MG PO TABS: 2.0000 | ORAL_TABLET | ORAL | Status: AC

## 2014-06-28 MED ORDER — IBUPROFEN 600 MG PO TABS: 600.0000 mg | ORAL_TABLET | Freq: Four times a day (QID) | ORAL | Status: DC | PRN

## 2014-06-28 MED ORDER — INSULIN GLARGINE 100 UNIT/ML ~~LOC~~ SOLN: 6.0000 [IU] | Freq: Every day | SUBCUTANEOUS | Status: DC

## 2014-06-28 MED ORDER — METFORMIN HCL ER (OSM) 1000 MG PO TB24: 1000.0000 mg | ORAL_TABLET | Freq: Two times a day (BID) | ORAL | Status: AC

## 2014-06-28 MED ORDER — FERROUS SULFATE 325 (65 FE) MG PO TBEC: 325.0000 mg | DELAYED_RELEASE_TABLET | Freq: Two times a day (BID) | ORAL | Status: AC

## 2014-06-28 MED ORDER — SIMETHICONE 80 MG PO CHEW: 80.0000 mg | CHEWABLE_TABLET | Freq: Three times a day (TID) | ORAL | Status: AC

## 2014-06-28 NOTE — Discharge Summary (Signed)
Cesarean Section Delivery Discharge Summary  Kathryn Howard  DOB:    1976/04/26 MRN:    [id number] CSN:    [id number]  Date of admission:                  06/25/14  Date of discharge:                   06/28/14  Procedures this admission:  Repeat CS  Date of Delivery: 06/25/14  Newborn Data:  Live born  Information for the patient's newborn:  Pessy, Delamar [[id number]]  female  Live born female  Birth Weight: 5 lb 7.5 oz (2480 g) APGAR: 7, 8    Baby to remain in NICU   History of Present Illness:  [name] is a 39 y.o. female, [phone], who presents at [redacted]w[redacted]d weeks gestation. The patient has been followed at the [facility] and Gynecology division of [facility].    Her pregnancy has been complicated by:  Patient Active Problem List   Diagnosis Date Noted  . S/P repeat low transverse C-section 06/25/2014  . 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 Separation of cesarean wound with drainage, postpartum 11/06/2011  . Cesarean wound disruption Dec 03, 2011  . Neonatal death 11-26-2011  . Seroma, postoperative 11-26-2011  . Preterm delivery 10/19/2011  . Anemia 10/19/2011  . History of PCOS 09/14/2011  . 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 Type 2 diabetes mellitus 07/10/2011  . Increased BMI 07/10/2011  . Hx of infertility 07/10/2011    Hospital course: The patient was admitted for repeat CS.   Her postpartum course was not complicated.  She was discharged to home on postpartum day 3 doing well.  Feeding: Breastfeeding/pumping  Contraception: unsure Pt understands the risks are but not limited to irregular bleeding, formation of DVT, fluid fluctuations, elevation in blood pressure, stroke, breast tenderness and liver damage.  She states she will report any serious side effects.  She has been given verbal and written instructions and voiced a clear understanding.    Discharge hemoglobin: HEMOGLOBIN  Date Value Ref Range Status  06/26/2014  9.9* 12.0 - 15.0 g/dL Final  84/13/2440 10.2 g/dL    HCT  Date Value Ref Range Status  06/26/2014 30.6* 36.0 - 46.0 % Final  07/10/2011 35 %    Anemia - hemodynamicly stable.   PreNatal Labs ABO, Rh: --/--/O POS (02/23 1447)   Antibody: NEG (02/23 1447) Rubella:   immune RPR: Non Reactive (02/23 1447)  HBsAg: Negative (08/19 0000)  HIV: Non-reactive (08/19 0000)  GBS:    Discharge Physical Exam:  General: alert and cooperative Lochia: appropriate Uterine Fundus: firm Incision: healing well DVT Evaluation: No evidence of DVT seen on physical exam.  Intrapartum Procedures: cesarean: low cervical, transverse Postpartum Procedures: cerclage removed Complications-Operative and Postpartum: none  Discharge Diagnoses: Term Pregnancy-delivered,  asymptomatic anemia  Discharge Information:  Activity:           pelvic rest Diet:                routine Medications: PNV, Ibuprofen, Iron, Percocet and Synthroid, senokot, simethicone, metformin, lantus Condition:      stable   Discharge to: home  Follow-up Information    Follow up with [facility] In 2 weeks.   Specialty:  Obstetrics and Gynecology   Why:  For wound re-check   Contact information:   [address] [place] [address] [phone]  Follow up with [facility] Gy[facility] In 6 weeks.   Specialty:  Obstetrics and Gynecology   Why:  Postpartum check up   Contact information:   [address] [place] [address] [phone]       Adelina Mings, CNM, MSN  06/28/2014. 2:33 PM  Care After Cesarean Delivery  Refer to this sheet in the next few weeks. These instructions provide you with information on caring for yourself after your procedure. Your caregiver may also give you specific instructions. Your treatment has been planned according to current medical practices, but problems sometimes  occur. Call your caregiver if you have any problems or questions after you go home. HOME CARE INSTRUCTIONS  Only take over-the-counter or prescription medicines as directed by your caregiver.  Do not drink alcohol, especially if you are breastfeeding or taking medicine to relieve pain.  Do not chew or smoke tobacco.  Continue to use good perineal care. Good perineal care includes:  Wiping your perineum from front to back.  Keeping your perineum clean.  Check your cut (incision) daily for increased redness, drainage, swelling, or separation of skin.  Clean your incision gently with soap and water every day, and then pat it dry. If your caregiver says it is okay, leave the incision uncovered. Use a bandage (dressing) if the incision is draining fluid or appears irritated. If the adhesive strips across the incision do not fall off within 7 days, carefully peel them off.  Hug a pillow when coughing or sneezing until your incision is healed. This helps to relieve pain.  Do not use tampons or douche until your caregiver says it is okay.  Shower, wash your hair, and take tub baths as directed by your caregiver.  Wear a well-fitting bra that provides breast support.  Limit wearing support panties or control-top hose.  Drink enough fluids to keep your urine clear or pale yellow.  Eat high-fiber foods such as whole grain cereals and breads, brown rice, beans, and fresh fruits and vegetables every day. These foods may help prevent or relieve constipation.  Resume activities such as climbing stairs, driving, lifting, exercising, or traveling as directed by your caregiver.  Talk to your caregiver about resuming sexual activities. This is dependent upon your risk of infection, your rate of healing, and your comfort and desire to resume sexual activity.  Try to have someone help you with your household activities and your newborn for at least a few days after you leave the hospital.  Rest as  much as possible. Try to rest or take a nap when your newborn is sleeping.  Increase your activities gradually.  Keep all of your scheduled postpartum appointments. It is very important to keep your scheduled follow-up appointments. At these appointments, your caregiver will be checking to make sure that you are healing physically and emotionally. SEEK MEDICAL CARE IF:   You are passing large clots from your vagina. Save any clots to show your caregiver.  You have a foul smelling discharge from your vagina.  You have trouble urinating.  You are urinating frequently.  You have pain when you urinate.  You have a change in your bowel movements.  You have increasing redness, pain, or swelling near your incision.  You have pus draining from your incision.  Your incision is separating.  You have painful, hard, or reddened breasts.  You have a severe headache.  You have blurred vision or see spots.  You feel sad or depressed.  You have thoughts of hurting yourself or your newborn.  You have questions about your care, the care of your newborn, or medicines.  You are dizzy or lightheaded.  You have a rash.  You have pain, redness, or swelling at the site of the removed intravenous access (IV) tube.  You have nausea or vomiting.  You stopped breastfeeding and have not had a menstrual period within 12 weeks of stopping.  You are not breastfeeding and have not had a menstrual period within 12 weeks of delivery.  You have a fever. SEEK IMMEDIATE MEDICAL CARE IF:  You have persistent pain.  You have chest pain.  You have shortness of breath.  You faint.  You have leg pain.  You have stomach pain.  Your vaginal bleeding saturates 2 or more sanitary pads in 1 hour. MAKE SURE YOU:   Understand these instructions.  Will watch your condition.  Will get help right away if you are not doing well or get worse. Document Released: 01/07/2002 Document Revised:  01/10/2012 Document Reviewed: 12/13/2011 [facility] Patient Information 2014 [place].   Postpartum Depression and Baby Blues  The postpartum period begins right after the birth of a baby. During this time, there is often a great amount of joy and excitement. It is also a time of considerable changes in the life of the parent(s). Regardless of how many times a mother gives birth, each child brings new challenges and dynamics to the family. It is not unusual to have feelings of excitement accompanied by confusing shifts in moods, emotions, and thoughts. All mothers are at risk of developing postpartum depression or the "baby blues." These mood changes can occur right after giving birth, or they may occur many months after giving birth. The baby blues or postpartum depression can be mild or severe. Additionally, postpartum depression can resolve rather quickly, or it can be a long-term condition. CAUSES Elevated hormones and their rapid decline are thought to be a main cause of postpartum depression and the baby blues. There are a number of hormones that radically change during and after pregnancy. Estrogen and progesterone usually decrease immediately after delivering your baby. The level of thyroid hormone and various cortisol steroids also rapidly drop. Other factors that play a major role in these changes include major life events and genetics.  RISK FACTORS If you have any of the following risks for the baby blues or postpartum depression, know what symptoms to watch out for during the postpartum period. Risk factors that may increase the likelihood of getting the baby blues or postpartum depression include: 1. Havinga personal or family history of depression. 2. Having depression while being pregnant. 3. Having premenstrual or oral contraceptive-associated mood issues. 4. Having exceptional life stress. 5. Having marital conflict. 6. Lacking a social support network. 7. Having a baby with  special needs. 8. Having health problems such as diabetes. SYMPTOMS Baby blues symptoms include:  Brief fluctuations in mood, such as going from extreme happiness to sadness.  Decreased concentration.  Difficulty sleeping.  Crying spells, tearfulness.  Irritability.  Anxiety. Postpartum depression symptoms typically begin within the first month after giving birth. These symptoms include:  Difficulty sleeping or excessive sleepiness.  Marked weight loss.  Agitation.  Feelings of worthlessness.  Lack of interest in activity or food. Postpartum psychosis is a very concerning condition and can be dangerous. Fortunately, it is rare. Displaying any of the following symptoms is cause for immediate medical attention. Postpartum psychosis symptoms include:  Hallucinations and delusions.  Bizarre or disorganized behavior.  Confusion or disorientation. DIAGNOSIS  A diagnosis is made by an evaluation of your symptoms. There are no medical or lab tests that lead to a diagnosis, but there are various questionnaires that a caregiver may use to identify those with the baby blues, postpartum depression, or psychosis. Often times, a screening tool called the [place] Postnatal Depression Scale is used to diagnose depression in the postpartum period.  TREATMENT The baby blues usually goes away on its own in 1 to 2 weeks. Social support is often all that is needed. You should be encouraged to get adequate sleep and rest. Occasionally, you may be given medicines to help you sleep.  Postpartum depression requires treatment as it can last several months or longer if it is not treated. Treatment may include individual or group therapy, medicine, or both to address any social, physiological, and psychological factors that may play a role in the depression. Regular exercise, a healthy diet, rest, and social support may also be strongly recommended.  Postpartum psychosis is more serious and needs  treatment right away. Hospitalization is often needed. HOME CARE INSTRUCTIONS  Get as much rest as you can. Nap when the baby sleeps.  Exercise regularly. Some women find yoga and walking to be beneficial.  Eat a balanced and nourishing diet.  Do little things that you enjoy. Have a cup of tea, take a bubble bath, read your favorite magazine, or listen to your favorite music.  Avoid alcohol.  Ask for help with household chores, cooking, grocery shopping, or running errands as needed. Do not try to do everything.  Talk to people close to you about how you are feeling. Get support from your partner, family members, friends, or other new moms.  Try to stay positive in how you think. Think about the things you are grateful for.  Do not spend a lot of time alone.  Only take medicine as directed by your caregiver.  Keep all your postpartum appointments.  Let your caregiver know if you have any concerns. SEEK MEDICAL CARE IF: You are having a reaction or problems with your medicine. SEEK IMMEDIATE MEDICAL CARE IF:  You have suicidal feelings.  You feel you may harm the baby or someone else. Document Released: 01/20/2004 Document Revised: 07/10/2011 Document Reviewed: 02/21/2011 [facility] Patient Information 2014 [place], [place].   Breastfeeding Deciding to breastfeed is one of the best choices you can make for you and your baby. A change in hormones during pregnancy causes your breast tissue to grow and increases the number and size of your milk ducts. These hormones also allow proteins, sugars, and fats from your blood supply to make breast milk in your milk-producing glands. Hormones prevent breast milk from being released before your baby is born as well as prompt milk flow after birth. Once breastfeeding has begun, thoughts of your baby, as well as his or her sucking or crying, can stimulate the release of milk from your milk-producing glands.  BENEFITS OF BREASTFEEDING For Your  Baby  Your first milk (colostrum) helps your baby's digestive system function better.   There are antibodies in your milk that help your baby fight off infections.   Your baby has a lower incidence of asthma, allergies, and sudden infant death syndrome.   The nutrients in breast milk are better for your baby than infant formulas and are designed uniquely for your baby's needs.   Breast milk improves your baby's brain development.   Your baby is less likely  to develop other conditions, such as childhood obesity, asthma, or type 2 diabetes mellitus.  For You   Breastfeeding helps to create a very special bond between you and your baby.   Breastfeeding is convenient. Breast milk is always available at the correct temperature and costs nothing.   Breastfeeding helps to burn calories and helps you lose the weight gained during pregnancy.   Breastfeeding makes your uterus contract to its prepregnancy size faster and slows bleeding (lochia) after you give birth.   Breastfeeding helps to lower your risk of developing type 2 diabetes mellitus, osteoporosis, and breast or ovarian cancer later in life. SIGNS THAT YOUR BABY IS HUNGRY Early Signs of Hunger  Increased alertness or activity.  Stretching.  Movement of the head from side to side.  Movement of the head and opening of the mouth when the corner of the mouth or cheek is stroked (rooting).  Increased sucking sounds, smacking lips, cooing, sighing, or squeaking.  Hand-to-mouth movements.  Increased sucking of fingers or hands. Late Signs of Hunger  Fussing.  Intermittent crying. Extreme Signs of Hunger Signs of extreme hunger will require calming and consoling before your baby will be able to breastfeed successfully. Do not wait for the following signs of extreme hunger to occur before you initiate breastfeeding:   Restlessness.  A loud, strong cry.   Screaming.  BREASTFEEDING BASICS Breastfeeding  Initiation  Find a comfortable place to sit or lie down, with your neck and back well supported.  Place a pillow or rolled up blanket under your baby to bring him or her to the level of your breast (if you are seated). Nursing pillows are specially designed to help support your arms and your baby while you breastfeed.  Make sure that your baby's abdomen is facing your abdomen.   Gently massage your breast. With your fingertips, massage from your chest wall toward your nipple in a circular motion. This encourages milk flow. You may need to continue this action during the feeding if your milk flows slowly.  Support your breast with 4 fingers underneath and your thumb above your nipple. Make sure your fingers are well away from your nipple and your baby's mouth.   Stroke your baby's lips gently with your finger or nipple.   When your baby's mouth is open wide enough, quickly bring your baby to your breast, placing your entire nipple and as much of the colored area around your nipple (areola) as possible into your baby's mouth.   More areola should be visible above your baby's upper lip than below the lower lip.   Your baby's tongue should be between his or her lower gum and your breast.   Ensure that your baby's mouth is correctly positioned around your nipple (latched). Your baby's lips should create a seal on your breast and be turned out (everted).  It is common for your baby to suck about 2-3 minutes in order to start the flow of breast milk. Latching Teaching your baby how to latch on to your breast properly is very important. An improper latch can cause nipple pain and decreased milk supply for you and poor weight gain in your baby. Also, if your baby is not latched onto your nipple properly, he or she may swallow some air during feeding. This can make your baby fussy. Burping your baby when you switch breasts during the feeding can help to get rid of the air. However, teaching your  baby to latch on properly is still  the best way to prevent fussiness from swallowing air while breastfeeding. Signs that your baby has successfully latched on to your nipple:    Silent tugging or silent sucking, without causing you pain.   Swallowing heard between every 3-4 sucks.    Muscle movement above and in front of his or her ears while sucking.  Signs that your baby has not successfully latched on to nipple:   Sucking sounds or smacking sounds from your baby while breastfeeding.  Nipple pain. If you think your baby has not latched on correctly, slip your finger into the corner of your baby's mouth to break the suction and place it between your baby's gums. Attempt breastfeeding initiation again. Signs of Successful Breastfeeding Signs from your baby:   A gradual decrease in the number of sucks or complete cessation of sucking.   Falling asleep.   Relaxation of his or her body.   Retention of a small amount of milk in his or her mouth.   Letting go of your breast by himself or herself. Signs from you:  Breasts that have increased in firmness, weight, and size 1-3 hours after feeding.   Breasts that are softer immediately after breastfeeding.  Increased milk volume, as well as a change in milk consistency and color by the fifth day of breastfeeding.   Nipples that are not sore, cracked, or bleeding. Signs That Your Pecola Leisure is Getting Enough Milk  Wetting at least 3 diapers in a 24-hour period. The urine should be clear and pale yellow by age 14 days.  At least 3 stools in a 24-hour period by age 14 days. The stool should be soft and yellow.  At least 3 stools in a 24-hour period by age 83 days. The stool should be seedy and yellow.  No loss of weight greater than 10% of birth weight during the first 75 days of age.  Average weight gain of 4-7 ounces (113-198 g) per week after age 35 days.  Consistent daily weight gain by age 14 days, without weight loss after the  age of 2 weeks. After a feeding, your baby may spit up a small amount. This is common. BREASTFEEDING FREQUENCY AND DURATION Frequent feeding will help you make more milk and can prevent sore nipples and breast engorgement. Breastfeed when you feel the need to reduce the fullness of your breasts or when your baby shows signs of hunger. This is called "breastfeeding on demand." Avoid introducing a pacifier to your baby while you are working to establish breastfeeding (the first 4-6 weeks after your baby is born). After this time you may choose to use a pacifier. Research has shown that pacifier use during the first year of a baby's life decreases the risk of sudden infant death syndrome (SIDS). Allow your baby to feed on each breast as long as he or she wants. Breastfeed until your baby is finished feeding. When your baby unlatches or falls asleep while feeding from the first breast, offer the second breast. Because newborns are often sleepy in the first few weeks of life, you may need to awaken your baby to get him or her to feed. Breastfeeding times will vary from baby to baby. However, the following rules can serve as a guide to help you ensure that your baby is properly fed:  Newborns (babies 39 weeks of age or younger) may breastfeed every 1-3 hours.  Newborns should not go longer than 3 hours during the day or 5 hours during the night without  breastfeeding.  You should breastfeed your baby a minimum of 8 times in a 24-hour period until you begin to introduce solid foods to your baby at around 29 months of age. BREAST MILK PUMPING Pumping and storing breast milk allows you to ensure that your baby is exclusively fed your breast milk, even at times when you are unable to breastfeed. This is especially important if you are going back to work while you are still breastfeeding or when you are not able to be present during feedings. Your lactation consultant can give you guidelines on how long it is safe to  store breast milk.  A breast pump is a machine that allows you to pump milk from your breast into a sterile bottle. The pumped breast milk can then be stored in a refrigerator or freezer. Some breast pumps are operated by hand, while others use electricity. Ask your lactation consultant which type will work best for you. Breast pumps can be purchased, but some hospitals and breastfeeding support groups lease breast pumps on a monthly basis. A lactation consultant can teach you how to hand express breast milk, if you prefer not to use a pump.  CARING FOR YOUR BREASTS WHILE YOU BREASTFEED Nipples can become dry, cracked, and sore while breastfeeding. The following recommendations can help keep your breasts moisturized and healthy:  Avoid using soap on your nipples.   Wear a supportive bra. Although not required, special nursing bras and tank tops are designed to allow access to your breasts for breastfeeding without taking off your entire bra or top. Avoid wearing underwire-style bras or extremely tight bras.  Air dry your nipples for 3-51minutes after each feeding.   Use only cotton bra pads to absorb leaked breast milk. Leaking of breast milk between feedings is normal.   Use lanolin on your nipples after breastfeeding. Lanolin helps to maintain your skin's normal moisture barrier. If you use pure lanolin, you do not need to wash it off before feeding your baby again. Pure lanolin is not toxic to your baby. You may also hand express a few drops of breast milk and gently massage that milk into your nipples and allow the milk to air dry. In the first few weeks after giving birth, some women experience extremely full breasts (engorgement). Engorgement can make your breasts feel heavy, warm, and tender to the touch. Engorgement peaks within 3-5 days after you give birth. The following recommendations can help ease engorgement:  Completely empty your breasts while breastfeeding or pumping. You may want  to start by applying warm, moist heat (in the shower or with warm water-soaked hand towels) just before feeding or pumping. This increases circulation and helps the milk flow. If your baby does not completely empty your breasts while breastfeeding, pump any extra milk after he or she is finished.  Wear a snug bra (nursing or regular) or tank top for 1-2 days to signal your body to slightly decrease milk production.  Apply ice packs to your breasts, unless this is too uncomfortable for you.  Make sure that your baby is latched on and positioned properly while breastfeeding. If engorgement persists after 48 hours of following these recommendations, contact your health care provider or a Advertising copywriter. OVERALL HEALTH CARE RECOMMENDATIONS WHILE BREASTFEEDING  Eat healthy foods. Alternate between meals and snacks, eating 3 of each per day. Because what you eat affects your breast milk, some of the foods may make your baby more irritable than usual. Avoid eating these foods if  you are sure that they are negatively affecting your baby.  Drink milk, fruit juice, and water to satisfy your thirst (about 10 glasses a day).   Rest often, relax, and continue to take your prenatal vitamins to prevent fatigue, stress, and anemia.  Continue breast self-awareness checks.  Avoid chewing and smoking tobacco.  Avoid alcohol and drug use. Some medicines that may be harmful to your baby can pass through breast milk. It is important to ask your health care provider before taking any medicine, including all over-the-counter and prescription medicine as well as vitamin and herbal supplements. It is possible to become pregnant while breastfeeding. If birth control is desired, ask your health care provider about options that will be safe for your baby. SEEK MEDICAL CARE IF:   You feel like you want to stop breastfeeding or have become frustrated with breastfeeding.  You have painful breasts or  nipples.  Your nipples are cracked or bleeding.  Your breasts are red, tender, or warm.  You have a swollen area on either breast.  You have a fever or chills.  You have nausea or vomiting.  You have drainage other than breast milk from your nipples.  Your breasts do not become full before feedings by the fifth day after you give birth.  You feel sad and depressed.  Your baby is too sleepy to eat well.  Your baby is having trouble sleeping.   Your baby is wetting less than 3 diapers in a 24-hour period.  Your baby has less than 3 stools in a 24-hour period.  Your baby's skin or the white part of his or her eyes becomes yellow.   Your baby is not gaining weight by 10 days of age. SEEK IMMEDIATE MEDICAL CARE IF:   Your baby is overly tired (lethargic) and does not want to wake up and feed.  Your baby develops an unexplained fever. Document Released: 04/17/2005 Document Revised: 04/22/2013 Document Reviewed: 10/09/2012 [facility] Patient Information 2015 [place], [place]. This information is not intended to replace advice given to you by your health care provider. Make sure you discuss any questions you have with your health care provider.

## 2014-06-28 NOTE — Progress Notes (Signed)
Questioned patient as to what type of birth control she desires to use. She is unsure and I suggested she go to office to get current information to be able to make a choice by her 6 week check up. Her baby girl's name is Ayah and the baby remains in NICU

## 2014-06-28 NOTE — Lactation Note (Signed)
This note was copied from the chart of Kathryn Makyra Pires. Lactation Consultation Note Assisted mom with breastfeeding in NICU.  Mom has flat nipples and baby having difficulty latching.  20 mm nipple shield applied and baby latched well and sucked mostly non-nutritive for 10 minutes.  Mom feeling discouraged.  She is very motivated to breastfeed her baby.  Reassured and reviewed late preterm feeding norm.  Mom is pumping and hand expressing drops.  2 week pump rental completed.  Patient Name: Kathryn Chipper [name] RUEAV'WToday's Date: 06/28/2014 Reason for consult: Follow-up assessment;NICU baby   Maternal Data    Feeding Feeding Type: Breast Fed Nipple Type: Slow - flow Length of feed: 10 min  LATCH Score/Interventions Latch: Repeated attempts needed to sustain latch, nipple held in mouth throughout feeding, stimulation needed to elicit sucking reflex. (WITH 20 MM NIPPLE SHIELD) Intervention(s): Skin to skin;Teach feeding cues;Waking techniques Intervention(s): Adjust position;Assist with latch;Breast massage;Breast compression  Audible Swallowing: A few with stimulation Intervention(s): Skin to skin;Hand expression  Type of Nipple: Flat Intervention(s): Double electric pump  Comfort (Breast/Nipple): Soft / non-tender     Hold (Positioning): Assistance needed to correctly position infant at breast and maintain latch. Intervention(s): Breastfeeding basics reviewed;Support Pillows;Position options;Skin to skin  LATCH Score: 6  Lactation Tools Discussed/Used     Consult Status      Huston FoleyMOULDEN, Kathryn Stamper S 06/28/2014, 2:43 PM

## 2014-06-28 NOTE — Progress Notes (Signed)
Clinical Social Work Department PSYCHOSOCIAL ASSESSMENT - MATERNAL/CHILD 06/28/2014  Patient:  Newhard,Kathy  Account Number:  [id number]  Admit Date:  06/25/2014  Ardine Eng Name:   Maeola Sarah Absim    Clinical Social Worker:  Tasheena Wambolt, LCSW   Date/Time:  06/28/2014 04:00 PM  Date Referred:  06/28/2014   Referral source  NICU     Referred reason  NICU   Other referral source:    I:  FAMILY / [place] legal guardian:  PARENT  Guardian - Name Guardian - Age Guardian - Address  [place]  [place], [place] [address]  Absim, Nasir  same as above   Other household support members/support persons Other support:    II  PSYCHOSOCIAL DATA Information Source:    Occupational hygienist Employment:   Spouse is employed   Museum/gallery curator resources:  Multimedia programmer If [place] / Grade:   Maternity Care Coordinator / Child Services Coordination / Early Interventions:  Cultural issues impacting care:    III  STRENGTHS Strengths  Supportive family/friends  Home prepared for Child (including basic supplies)  Adequate Resources   Strength comment:    IV  RISK FACTORS AND CURRENT PROBLEMS Current Problem:       V  SOCIAL WORK ASSESSMENT Met with mother who was pleasant and receptive to social work visit.  Parents are married and have no other dependents.   Both are from [place].  Maternal grandmother is visiting from [place] and will remain with the family for about 6 months.  Mother reports hx of depression and anxiety.  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 She also spoke about the loss of her child in 2013.  Mother states that she was under enormous stress with work and school which may have contributed to the loss of her child. 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   Mother spoke of how happy she is that newborn is doing great.  She reports no anxiety going to the NICU because her daughter is doing well and she expect a very short NICU stay.  Spoke with her regarding PP Depression and she noted that she did suffer from PP Depression after her loss of her child in 2013. Mother notes that therapy was very beneficial.  She denies any current symptoms of anxiety or depression.   No acute social concerns noted or reported at this time.      VI SOCIAL WORK PLAN Social Work Plan  No Further Intervention Required / No Barriers to Discharge   Type of pt/family education:   PP Depression information and resources   If child protective services report - county:   If child protective services report - date:   Information/referral to community resources comment:   Other social work plan:

## 2014-06-29 ENCOUNTER — Ambulatory Visit: Payer: Self-pay

## 2014-06-29 NOTE — Lactation Note (Signed)
This note was copied from the chart of Kathryn Howard. Lactation Consultation Note    Follow up consult with this mom of a NICU baby, now 454 days old, and 37 4/7 weeks CGA. The baby is small, weighing 5 lbs 5.3 oz. Mom is mostly pumping and bottle feeding. i assisted her with pumping today, in standard setting, with massage. She was able to express 43 mls of transitional milk. This was great for her. The next time she expressed 15 mls, but was encouraged by her nurse to keep pumping. Mom's breasts are full and heavy, and I had her support her breasts with a blanket roll during pumping, which helped, and then I advised mom to begin wearing her bra. Mom encouraged to stay hydrated. Mom knows to call for questions/concerns.   Patient Name: Kathryn Chipper HerbHiba Nazari AOZHY'QToday's Date: 06/29/2014     Maternal Data    Feeding Feeding Type: Breast Milk with Formula added Nipple Type: Slow - flow Length of feed: 30 min  LATCH Score/Interventions                      Lactation Tools Discussed/Used     Consult Status      Alfred LevinsLee, Callaway Hardigree Anne 06/29/2014, 3:52 PM

## 2014-06-30 ENCOUNTER — Ambulatory Visit: Payer: Self-pay

## 2014-06-30 NOTE — Lactation Note (Addendum)
This note was copied from the chart of Kathryn Shannelle Mchatton. Lactation Consultation Note  Patient Name: Kathryn Howard BJYNW'GToday's Date: 06/30/2014 Reason for consult: Follow-up assessment;NICU baby NICU baby, 5 days of life, day of D/C. Assisted mom to put baby to breast using #20 NS. Baby latched to right breast in cross-cradle position. Used cloth pillow to lift and support mom's breast. Baby latched deeply, suckled rhythmically with a few swallows noted. Mom able to hear swallows and note evidence of nutritious sucking. Baby is transferring milk as evidenced by full and then empty NS. Enc mom to use pillows at home to bring baby up to her breast. Also enc mom to put EBM in shield prior to latching the baby on. Enc mom to offer baby additional EBM in bottle after nursing, especially if baby begins to tire  at breast. Also enc mom to pump after baby has nursed, and use EBM at next feeding. Discussed need to pump in order to keep supply up. Mom aware of OP/BFSG and LC phone line assistance after D/C. Offered to make an appointment, but mom declines stating that she will call and make an appointment after she gets home. Discussed making OP apt for assistance with latching baby directly to breast. Enc mom to attempt latching without shield at home especially when baby not so hungry. Enc mom to use NS for a few minutes, and then try without the shield. [facility] with any questions.    Maternal Data    Feeding Feeding Type: Breast Milk Nipple Type: Slow - flow Length of feed: 30 min  LATCH Score/Interventions Latch: Grasps breast easily, tongue down, lips flanged, rhythmical sucking. Intervention(s): Teach feeding cues Intervention(s): Adjust position;Assist with latch  Audible Swallowing: A few with stimulation Intervention(s): Skin to skin  Type of Nipple: Flat Intervention(s): Shells  Comfort (Breast/Nipple): Soft / non-tender     Hold (Positioning): Assistance needed to  correctly position infant at breast and maintain latch. Intervention(s): Breastfeeding basics reviewed;Support Pillows;Position options  LATCH Score: 7  Lactation Tools Discussed/Used     Consult Status      Kathryn Howard, Dayquan Buys 06/30/2014, 2:18 PM

## 2016-03-31 ENCOUNTER — Ambulatory Visit (INDEPENDENT_AMBULATORY_CARE_PROVIDER_SITE_OTHER): Payer: BLUE CROSS/BLUE SHIELD | Admitting: Internal Medicine

## 2016-03-31 DIAGNOSIS — Z9189 Other specified personal risk factors, not elsewhere classified: Secondary | ICD-10-CM

## 2016-03-31 DIAGNOSIS — Z7189 Other specified counseling: Secondary | ICD-10-CM

## 2016-03-31 DIAGNOSIS — Z7184 Encounter for health counseling related to travel: Secondary | ICD-10-CM

## 2016-03-31 DIAGNOSIS — Z789 Other specified health status: Secondary | ICD-10-CM

## 2016-03-31 MED ORDER — AZITHROMYCIN 500 MG PO TABS: 500.0000 mg | ORAL_TABLET | Freq: Every day | ORAL | 0 refills | Status: DC

## 2016-03-31 MED ORDER — MEFLOQUINE HCL 250 MG PO TABS: 250.0000 mg | ORAL_TABLET | ORAL | 0 refills | Status: AC

## 2016-03-31 NOTE — Progress Notes (Signed)
Subjective:   Kathryn Howard is a 40 y.o. female who presents to the Infectious Disease clinic for travel consultation. Planned departure date: dec 12th         Planned return date: jan 8th Countries of travel: [place] Areas in country[place], [place] Accommodations: family Purpose of travel: vacation Prior travel out of [place]: [place], [place] and [place]     Objective:   Medications: metformin All: sulfa    Assessment:   No contraindications to travel. none     Plan:   Malaria proph = will give rx for mefloquine for herself and her husband  Typhoid/hep A = declined  Traveler's diarrhea = gave rx for azithromycin to use if needed

## 2016-05-21 ENCOUNTER — Encounter: Payer: Self-pay | Admitting: Obstetrics and Gynecology

## 2016-07-28 ENCOUNTER — Other Ambulatory Visit (HOSPITAL_COMMUNITY)
Admission: RE | Admit: 2016-07-28 | Discharge: 2016-07-28 | Disposition: A | Discharge status: Home / Self Care | Payer: BLUE CROSS/BLUE SHIELD | Source: Ambulatory Visit | Attending: Obstetrics and Gynecology | Admitting: Obstetrics and Gynecology

## 2016-07-28 DIAGNOSIS — Z3201 Encounter for pregnancy test, result positive: Secondary | ICD-10-CM | POA: Diagnosis not present

## 2016-07-28 DIAGNOSIS — Z029 Encounter for administrative examinations, unspecified: Secondary | ICD-10-CM | POA: Insufficient documentation

## 2016-07-28 LAB — HCG, QUANTITATIVE, PREGNANCY: HCG, BETA CHAIN, QUANT, S: 232 m[IU]/mL — AB (ref <5)

## 2016-07-31 DIAGNOSIS — E039 Hypothyroidism, unspecified: Secondary | ICD-10-CM | POA: Diagnosis not present

## 2016-07-31 DIAGNOSIS — E1165 Type 2 diabetes mellitus with hyperglycemia: Secondary | ICD-10-CM | POA: Diagnosis not present

## 2016-08-07 DIAGNOSIS — Z3490 Encounter for supervision of normal pregnancy, unspecified, unspecified trimester: Secondary | ICD-10-CM | POA: Diagnosis not present

## 2016-08-07 DIAGNOSIS — E039 Hypothyroidism, unspecified: Secondary | ICD-10-CM | POA: Diagnosis not present

## 2016-08-07 DIAGNOSIS — E1165 Type 2 diabetes mellitus with hyperglycemia: Secondary | ICD-10-CM | POA: Diagnosis not present

## 2016-08-08 DIAGNOSIS — O26879 Cervical shortening, unspecified trimester: Secondary | ICD-10-CM | POA: Diagnosis not present

## 2016-08-08 DIAGNOSIS — E559 Vitamin D deficiency, unspecified: Secondary | ICD-10-CM | POA: Diagnosis not present

## 2016-08-08 DIAGNOSIS — Z3A01 Less than 8 weeks gestation of pregnancy: Secondary | ICD-10-CM | POA: Diagnosis not present

## 2016-08-08 DIAGNOSIS — O3680X0 Pregnancy with inconclusive fetal viability, not applicable or unspecified: Secondary | ICD-10-CM | POA: Diagnosis not present

## 2016-08-08 LAB — OB RESULTS CONSOLE GC/CHLAMYDIA
Chlamydia: NEGATIVE
Gonorrhea: NEGATIVE

## 2016-08-08 LAB — OB RESULTS CONSOLE ABO/RH: RH TYPE: POSITIVE

## 2016-08-08 LAB — OB RESULTS CONSOLE HIV ANTIBODY (ROUTINE TESTING): HIV: NONREACTIVE

## 2016-08-08 LAB — OB RESULTS CONSOLE RPR: RPR: NONREACTIVE

## 2016-08-08 LAB — OB RESULTS CONSOLE ANTIBODY SCREEN: Antibody Screen: NEGATIVE

## 2016-08-08 LAB — OB RESULTS CONSOLE HEPATITIS B SURFACE ANTIGEN: Hepatitis B Surface Ag: NEGATIVE

## 2016-08-08 LAB — OB RESULTS CONSOLE RUBELLA ANTIBODY, IGM: RUBELLA: IMMUNE

## 2016-08-14 ENCOUNTER — Encounter: Payer: BLUE CROSS/BLUE SHIELD | Attending: Obstetrics and Gynecology | Admitting: Skilled Nursing Facility1

## 2016-08-14 ENCOUNTER — Encounter: Payer: Self-pay | Admitting: Skilled Nursing Facility1

## 2016-08-14 DIAGNOSIS — E1165 Type 2 diabetes mellitus with hyperglycemia: Secondary | ICD-10-CM | POA: Diagnosis not present

## 2016-08-14 DIAGNOSIS — Z3A01 Less than 8 weeks gestation of pregnancy: Secondary | ICD-10-CM | POA: Diagnosis not present

## 2016-08-14 DIAGNOSIS — O24111 Pre-existing diabetes mellitus, type 2, in pregnancy, first trimester: Secondary | ICD-10-CM | POA: Insufficient documentation

## 2016-08-14 DIAGNOSIS — E039 Hypothyroidism, unspecified: Secondary | ICD-10-CM | POA: Diagnosis not present

## 2016-08-14 DIAGNOSIS — O24119 Pre-existing diabetes mellitus, type 2, in pregnancy, unspecified trimester: Secondary | ICD-10-CM

## 2016-08-14 NOTE — Progress Notes (Signed)
Diabetes Self-Management Education  Visit Type: First/Initial  Appt. Start Time: 11:15Appt. End Time: 12:05  08/14/2016  [name], identified by name and date of birth, is a 41 y.o. female with a diagnosis of Diabetes: Type 2.   ASSESSMENT  Height  (1.6 m), weight 247 lb 11.2 oz (112.4 kg), unknown if currently breastfeeding. Body mass index is 43.88 kg/m.  Pt states her highest numbers are in the morning pre and post prandial.      Diabetes Self-Management Education - 08/14/16 1120      Visit Information   Visit Type First/Initial     Initial Visit   Diabetes Type Type 2   Are you currently following a meal plan? Yes   Are you taking your medications as prescribed? Yes     Health Coping   How would you rate your overall health? Good     Psychosocial Assessment   How often do you need to have someone help you when you read instructions, pamphlets, or other written materials from your doctor or pharmacy? 1 - Never   What is the last grade level you completed in school? college     Pre-Education Assessment   Patient understands the diabetes disease and treatment process. Demonstrates understanding / competency   Patient understands incorporating nutritional management into lifestyle. Demonstrates understanding / competency   Patient undertands incorporating physical activity into lifestyle. Demonstrates understanding / competency   Patient understands using medications safely. Demonstrates understanding / competency   Patient understands monitoring blood glucose, interpreting and using results Demonstrates understanding / competency   Patient understands prevention, detection, and treatment of acute complications. Demonstrates understanding / competency   Patient understands prevention, detection, and treatment of chronic complications. Demonstrates understanding / competency   Patient understands how to develop strategies to address psychosocial issues.  Demonstrates understanding / competency   Patient understands how to develop strategies to promote health/change behavior. Demonstrates understanding / competency     Complications   Last HgB A1C per patient/outside source 9.8 %   How often do you check your blood sugar? > 4 times/day   Fasting Blood glucose range (mg/dL) >161;096-045   Postprandial Blood glucose range (mg/dL) 409-811   Have you had a dilated eye exam in the past 12 months? No   Have you had a dental exam in the past 12 months? Yes   Are you checking your feet? Yes     Dietary Intake   Breakfast whole milk and tea with honey and oatmeal with fruit and nuts   Lunch beef with mushroom and french fries---sandwich    Snack (afternoon) yogurt---fruit-----cheese and chips   Dinner protein with salad-----steak and cheese sandwich   Snack (evening) --   Beverage(s) milk, tea, water, diet soda, smoothie with fruit and almond milk/cocunut milk      Exercise   Exercise Type ADL's  possibly will be on bed rest     Patient Education   Previous Diabetes Education Yes (please comment)     Individualized Goals (developed by patient)   Nutrition Follow meal plan discussed;General guidelines for healthy choices and portions discussed   Medications take my medication as prescribed   Monitoring  test my blood glucose as discussed;test blood glucose pre and post meals as discussed     Post-Education Assessment   Patient understands the diabetes disease and treatment process. Demonstrates understanding / competency   Patient understands incorporating nutritional management into lifestyle. Demonstrates understanding / competency   Patient undertands  incorporating physical activity into lifestyle. Demonstrates understanding / competency   Patient understands using medications safely. Demonstrates understanding / competency   Patient understands monitoring blood glucose, interpreting and using results Demonstrates understanding /  competency   Patient understands prevention, detection, and treatment of acute complications. Demonstrates understanding / competency   Patient understands prevention, detection, and treatment of chronic complications. Demonstrates understanding / competency   Patient understands how to develop strategies to address psychosocial issues. Demonstrates understanding / competency   Patient understands how to develop strategies to promote health/change behavior. Demonstrates understanding / competency     Outcomes   Expected Outcomes Demonstrated interest in learning. Expect positive outcomes   Future DMSE PRN   Program Status Completed      Individualized Plan for Diabetes Self-Management Training:   Learning Objective:  Patient will have a greater understanding of diabetes self-management. Patient education plan is to attend individual and/or group sessions per assessed needs and concerns.   Plan:   Patient Instructions  -Change up your breakfast to not include fruit  -Add greek yogurt to your smoothie for the protein  -Below 60 is low blood sugar   -Once you have gotten your blood sugar up from low eat either your meal or a snack with protein to keep your sugar up and normal  -After dinner snack: energy with protein: fruit and cheese OR greek yogurt    Expected Outcomes:  Demonstrated interest in learning. Expect positive outcomes  Education material provided: Meal plan card, My Plate and Snack sheet  If problems or questions, patient to contact team via:  Phone  Future DSME appointment: PRN

## 2016-08-14 NOTE — Patient Instructions (Addendum)
-  Change up your breakfast to not include fruit  -Add greek yogurt to your smoothie for the protein  -Below 60 is low blood sugar   -Once you have gotten your blood sugar up from low eat either your meal or a snack with protein to keep your sugar up and normal  -After dinner snack: energy with protein: fruit and cheese OR greek yogurt

## 2016-08-17 DIAGNOSIS — Z3491 Encounter for supervision of normal pregnancy, unspecified, first trimester: Secondary | ICD-10-CM | POA: Diagnosis not present

## 2016-08-17 DIAGNOSIS — F329 Major depressive disorder, single episode, unspecified: Secondary | ICD-10-CM | POA: Diagnosis not present

## 2016-08-17 DIAGNOSIS — O24919 Unspecified diabetes mellitus in pregnancy, unspecified trimester: Secondary | ICD-10-CM | POA: Diagnosis not present

## 2016-08-17 DIAGNOSIS — Z3A01 Less than 8 weeks gestation of pregnancy: Secondary | ICD-10-CM | POA: Diagnosis not present

## 2016-08-17 DIAGNOSIS — O09299 Supervision of pregnancy with other poor reproductive or obstetric history, unspecified trimester: Secondary | ICD-10-CM | POA: Diagnosis not present

## 2016-08-23 DIAGNOSIS — E1165 Type 2 diabetes mellitus with hyperglycemia: Secondary | ICD-10-CM | POA: Diagnosis not present

## 2016-08-23 DIAGNOSIS — E039 Hypothyroidism, unspecified: Secondary | ICD-10-CM | POA: Diagnosis not present

## 2016-08-24 ENCOUNTER — Other Ambulatory Visit: Payer: Self-pay | Admitting: Obstetrics and Gynecology

## 2016-08-28 DIAGNOSIS — F331 Major depressive disorder, recurrent, moderate: Secondary | ICD-10-CM | POA: Diagnosis not present

## 2016-09-05 DIAGNOSIS — F331 Major depressive disorder, recurrent, moderate: Secondary | ICD-10-CM | POA: Diagnosis not present

## 2016-09-11 DIAGNOSIS — E1165 Type 2 diabetes mellitus with hyperglycemia: Secondary | ICD-10-CM | POA: Diagnosis not present

## 2016-09-11 DIAGNOSIS — E039 Hypothyroidism, unspecified: Secondary | ICD-10-CM | POA: Diagnosis not present

## 2016-09-11 DIAGNOSIS — F331 Major depressive disorder, recurrent, moderate: Secondary | ICD-10-CM | POA: Diagnosis not present

## 2016-09-13 DIAGNOSIS — Z3401 Encounter for supervision of normal first pregnancy, first trimester: Secondary | ICD-10-CM | POA: Diagnosis not present

## 2016-09-13 DIAGNOSIS — O09521 Supervision of elderly multigravida, first trimester: Secondary | ICD-10-CM | POA: Diagnosis not present

## 2016-09-18 NOTE — Patient Instructions (Addendum)
Your procedure is scheduled on:  Wednesday, Sep 27, 2016  [facility]:  8:00 AM  Pick up the phone at the desk and dial [phone].  Call this number if you have problems the morning of surgery: [phone].  Remember: Do NOT eat food or drink after:  Midnight Tuesday  Take these medicines the morning of surgery with a SIP OF WATER:  Levothyroxine  Do NOT take evening dose of Metformin the night before surgery  Take 1/2 dose of Lantus the night before surgery  Take regular dose of Humalog the night before surgery  Bring Asthma Inhaler day of surgery  Stop ALL herbal medications at this time  Do NOT smoke the day of surgery.  Do NOT wear jewelry (body piercing), metal hair clips/bobby pins, make-up, or nail polish. Do NOT wear lotions, powders, or perfumes.  You may wear deodorant. Do NOT shave for 48 hours prior to surgery. Do NOT bring valuables to the hospital. Contacts, dentures, or bridgework may not be worn into surgery.  Have a responsible adult drive you home and stay with you for 24 hours after your procedure  Bring a copy of your healthcare power of attorney and living will documents.

## 2016-09-19 ENCOUNTER — Encounter (HOSPITAL_COMMUNITY)
Admission: RE | Admit: 2016-09-19 | Discharge: 2016-09-19 | Disposition: A | Discharge status: Home / Self Care | Payer: BLUE CROSS/BLUE SHIELD | Source: Ambulatory Visit | Attending: Obstetrics and Gynecology | Admitting: Obstetrics and Gynecology

## 2016-09-19 ENCOUNTER — Encounter (HOSPITAL_COMMUNITY): Payer: Self-pay

## 2016-09-19 DIAGNOSIS — N883 Incompetence of cervix uteri: Secondary | ICD-10-CM | POA: Diagnosis present

## 2016-09-19 DIAGNOSIS — O3431 Maternal care for cervical incompetence, first trimester: Secondary | ICD-10-CM | POA: Diagnosis not present

## 2016-09-19 DIAGNOSIS — Z3A00 Weeks of gestation of pregnancy not specified: Secondary | ICD-10-CM | POA: Diagnosis not present

## 2016-09-19 HISTORY — DX: Unspecified asthma, uncomplicated: J45.909

## 2016-09-19 HISTORY — DX: Gastro-esophageal reflux disease without esophagitis: K21.9

## 2016-09-19 HISTORY — DX: Hypotension, unspecified: I95.9

## 2016-09-19 LAB — CBC
HCT: 33.7 % — ABNORMAL LOW (ref 36.0–46.0)
Hemoglobin: 11 g/dL — ABNORMAL LOW (ref 12.0–15.0)
MCH: 26.3 pg (ref 26.0–34.0)
MCHC: 32.6 g/dL (ref 30.0–36.0)
MCV: 80.4 fL (ref 78.0–100.0)
PLATELETS: 234 10*3/uL (ref 150–400)
RBC: 4.19 MIL/uL (ref 3.87–5.11)
RDW: 14.3 % (ref 11.5–15.5)
WBC: 10.2 10*3/uL (ref 4.0–10.5)

## 2016-09-19 LAB — BASIC METABOLIC PANEL
Anion gap: 8 (ref 5–15)
BUN: 8 mg/dL (ref 6–20)
CO2: 23 mmol/L (ref 22–32)
Calcium: 9.3 mg/dL (ref 8.9–10.3)
Chloride: 104 mmol/L (ref 101–111)
Creatinine, Ser: 0.44 mg/dL (ref 0.44–1.00)
GFR calc Af Amer: >60 mL/min (ref >60)
Glucose, Bld: 119 mg/dL — ABNORMAL HIGH (ref 65–99)
Potassium: 3.7 mmol/L (ref 3.5–5.1)
Sodium: 135 mmol/L (ref 135–145)

## 2016-09-19 NOTE — Pre-Procedure Instructions (Signed)
Received instruction about Insulin management for surgery from Dr. Mal AmabileMichael Foster.

## 2016-09-20 DIAGNOSIS — Z9189 Other specified personal risk factors, not elsewhere classified: Secondary | ICD-10-CM | POA: Diagnosis not present

## 2016-09-20 DIAGNOSIS — B373 Candidiasis of vulva and vagina: Secondary | ICD-10-CM | POA: Diagnosis not present

## 2016-09-23 ENCOUNTER — Other Ambulatory Visit: Payer: Self-pay

## 2016-09-25 DIAGNOSIS — F331 Major depressive disorder, recurrent, moderate: Secondary | ICD-10-CM | POA: Diagnosis not present

## 2016-09-27 ENCOUNTER — Ambulatory Visit (HOSPITAL_COMMUNITY)
Admission: RE | Admit: 2016-09-27 | Discharge: 2016-09-27 | Disposition: A | Discharge status: Home / Self Care | Payer: BLUE CROSS/BLUE SHIELD | Source: Ambulatory Visit | Attending: Obstetrics and Gynecology | Admitting: Obstetrics and Gynecology

## 2016-09-27 ENCOUNTER — Encounter (HOSPITAL_COMMUNITY)
Admission: RE | Disposition: A | Discharge status: Home / Self Care | Payer: Self-pay | Source: Ambulatory Visit | Attending: Obstetrics and Gynecology

## 2016-09-27 ENCOUNTER — Ambulatory Visit (HOSPITAL_COMMUNITY): Payer: BLUE CROSS/BLUE SHIELD | Admitting: Certified Registered Nurse Anesthetist

## 2016-09-27 ENCOUNTER — Encounter (HOSPITAL_COMMUNITY): Payer: Self-pay

## 2016-09-27 DIAGNOSIS — Z6841 Body Mass Index (BMI) 40.0 and over, adult: Secondary | ICD-10-CM | POA: Diagnosis not present

## 2016-09-27 DIAGNOSIS — Z882 Allergy status to sulfonamides status: Secondary | ICD-10-CM | POA: Diagnosis not present

## 2016-09-27 DIAGNOSIS — O99211 Obesity complicating pregnancy, first trimester: Secondary | ICD-10-CM | POA: Diagnosis not present

## 2016-09-27 DIAGNOSIS — K3184 Gastroparesis: Secondary | ICD-10-CM | POA: Diagnosis not present

## 2016-09-27 DIAGNOSIS — O09211 Supervision of pregnancy with history of pre-term labor, first trimester: Secondary | ICD-10-CM | POA: Diagnosis not present

## 2016-09-27 DIAGNOSIS — Z794 Long term (current) use of insulin: Secondary | ICD-10-CM | POA: Diagnosis not present

## 2016-09-27 DIAGNOSIS — E559 Vitamin D deficiency, unspecified: Secondary | ICD-10-CM | POA: Diagnosis not present

## 2016-09-27 DIAGNOSIS — O99281 Endocrine, nutritional and metabolic diseases complicating pregnancy, first trimester: Secondary | ICD-10-CM | POA: Insufficient documentation

## 2016-09-27 DIAGNOSIS — O24911 Unspecified diabetes mellitus in pregnancy, first trimester: Secondary | ICD-10-CM | POA: Diagnosis not present

## 2016-09-27 DIAGNOSIS — O26891 Other specified pregnancy related conditions, first trimester: Secondary | ICD-10-CM | POA: Diagnosis not present

## 2016-09-27 DIAGNOSIS — E039 Hypothyroidism, unspecified: Secondary | ICD-10-CM | POA: Insufficient documentation

## 2016-09-27 DIAGNOSIS — Z3A13 13 weeks gestation of pregnancy: Secondary | ICD-10-CM | POA: Diagnosis not present

## 2016-09-27 DIAGNOSIS — O3431 Maternal care for cervical incompetence, first trimester: Secondary | ICD-10-CM | POA: Insufficient documentation

## 2016-09-27 DIAGNOSIS — Z79899 Other long term (current) drug therapy: Secondary | ICD-10-CM | POA: Diagnosis not present

## 2016-09-27 DIAGNOSIS — E1143 Type 2 diabetes mellitus with diabetic autonomic (poly)neuropathy: Secondary | ICD-10-CM | POA: Diagnosis not present

## 2016-09-27 HISTORY — PX: CERVICAL CERCLAGE: SHX1329

## 2016-09-27 LAB — GLUCOSE, CAPILLARY
Glucose-Capillary: 106 mg/dL — ABNORMAL HIGH (ref 65–99)
Glucose-Capillary: 82 mg/dL (ref 65–99)

## 2016-09-27 SURGERY — CERCLAGE, CERVIX, VAGINAL APPROACH
Anesthesia: Spinal | Site: Vagina

## 2016-09-27 MED ORDER — LIDOCAINE-EPINEPHRINE 2 %-1:100000 IJ SOLN: INTRAMUSCULAR | Status: DC | PRN

## 2016-09-27 MED ORDER — OXYCODONE-ACETAMINOPHEN 5-325 MG PO TABS: 1.0000 | ORAL_TABLET | ORAL | 0 refills | Status: DC | PRN

## 2016-09-27 MED ORDER — BUPIVACAINE IN DEXTROSE 0.75-8.25 % IT SOLN: INTRATHECAL | Status: DC | PRN

## 2016-09-27 MED ORDER — PHENYLEPHRINE HCL 10 MG/ML IJ SOLN
INTRAMUSCULAR | Status: DC | PRN
  Administered 2016-09-27 (×2): 40 ug via INTRAVENOUS

## 2016-09-27 MED ORDER — ONDANSETRON HCL 4 MG/2ML IJ SOLN
INTRAMUSCULAR | Status: DC | PRN
  Administered 2016-09-27: 4 mg via INTRAVENOUS

## 2016-09-27 MED ORDER — PROPOFOL 10 MG/ML IV BOLUS
INTRAVENOUS | Status: DC | PRN
  Administered 2016-09-27: 30 mg via INTRAVENOUS

## 2016-09-27 MED ORDER — OXYCODONE HCL 5 MG PO TABS: 5.0000 mg | ORAL_TABLET | Freq: Once | ORAL | Status: DC | PRN

## 2016-09-27 MED ORDER — PHENYLEPHRINE 8 MG IN D5W 100 ML (0.08MG/ML) PREMIX OPTIME: INJECTION | INTRAVENOUS | Status: DC | PRN

## 2016-09-27 MED ORDER — ONDANSETRON HCL 4 MG/2ML IJ SOLN
INTRAMUSCULAR | Status: AC
  Filled 2016-09-27: qty 2

## 2016-09-27 MED ORDER — BUPIVACAINE IN DEXTROSE 0.75-8.25 % IT SOLN
INTRATHECAL | Status: DC | PRN
  Administered 2016-09-27: 1.2 mL via INTRATHECAL

## 2016-09-27 MED ORDER — FENTANYL CITRATE (PF) 100 MCG/2ML IJ SOLN
INTRAMUSCULAR | Status: DC | PRN
  Administered 2016-09-27 (×2): 50 ug via INTRAVENOUS

## 2016-09-27 MED ORDER — BUPIVACAINE IN DEXTROSE 0.75-8.25 % IT SOLN
INTRATHECAL | Status: AC
  Filled 2016-09-27: qty 2

## 2016-09-27 MED ORDER — OXYCODONE HCL 5 MG/5ML PO SOLN: 5.0000 mg | Freq: Once | ORAL | Status: DC | PRN

## 2016-09-27 MED ORDER — SODIUM CHLORIDE 0.9 % IJ SOLN
Freq: Once | INTRAMUSCULAR | Status: DC
  Filled 2016-09-27: qty 1

## 2016-09-27 MED ORDER — HYDROMORPHONE HCL 1 MG/ML IJ SOLN: 0.2500 mg | INTRAMUSCULAR | Status: DC | PRN

## 2016-09-27 MED ORDER — PROMETHAZINE HCL 25 MG/ML IJ SOLN: 6.2500 mg | INTRAMUSCULAR | Status: DC | PRN

## 2016-09-27 MED ORDER — IBUPROFEN 600 MG PO TABS: 600.0000 mg | ORAL_TABLET | Freq: Four times a day (QID) | ORAL | 0 refills | Status: AC | PRN

## 2016-09-27 MED ORDER — LACTATED RINGERS IV SOLN
INTRAVENOUS | Status: DC
  Administered 2016-09-27: 125 mL/h via INTRAVENOUS
  Administered 2016-09-27: 1000 mL via INTRAVENOUS

## 2016-09-27 MED ORDER — PHENYLEPHRINE 40 MCG/ML (10ML) SYRINGE FOR IV PUSH (FOR BLOOD PRESSURE SUPPORT)
PREFILLED_SYRINGE | INTRAVENOUS | Status: AC
Start: 2016-09-27 — End: 2016-09-27
  Filled 2016-09-27: qty 10

## 2016-09-27 MED ORDER — FENTANYL CITRATE (PF) 100 MCG/2ML IJ SOLN
INTRAMUSCULAR | Status: AC
  Filled 2016-09-27: qty 2

## 2016-09-27 SURGICAL SUPPLY — 21 items
CANISTER SUCT 3000ML PPV (MISCELLANEOUS) ×3 IMPLANT
CLOTH BEACON ORANGE TIMEOUT ST (SAFETY) ×3 IMPLANT
COUNTER NEEDLE 1200 MAGNETIC (NEEDLE) ×3 IMPLANT
GLOVE BIO SURGEON STRL SZ7.5 (GLOVE) ×3 IMPLANT
GLOVE BIOGEL PI IND STRL 7.0 (GLOVE) ×1 IMPLANT
GLOVE BIOGEL PI IND STRL 7.5 (GLOVE) ×1 IMPLANT
GLOVE BIOGEL PI INDICATOR 7.0 (GLOVE) ×2
GLOVE BIOGEL PI INDICATOR 7.5 (GLOVE) ×2
GOWN STRL REUS W/TWL LRG LVL3 (GOWN DISPOSABLE) ×6 IMPLANT
NS IRRIG 1000ML POUR BTL (IV SOLUTION) ×3 IMPLANT
PACK VAGINAL MINOR WOMEN LF (CUSTOM PROCEDURE TRAY) ×3 IMPLANT
PAD OB MATERNITY 4.3X12.25 (PERSONAL CARE ITEMS) ×3 IMPLANT
PAD PREP 24X48 CUFFED NSTRL (MISCELLANEOUS) ×3 IMPLANT
SUT MERSILENE 5MM BP 1 12 (SUTURE) ×3 IMPLANT
SUT PROLENE 1 CT 1 30 (SUTURE) ×3 IMPLANT
SYR BULB IRRIGATION 50ML (SYRINGE) ×3 IMPLANT
TOWEL OR 17X24 6PK STRL BLUE (TOWEL DISPOSABLE) ×3 IMPLANT
TRAY FOLEY CATH SILVER 14FR (SET/KITS/TRAYS/PACK) ×3 IMPLANT
TUBING NON-CON 1/4 X 20 CONN (TUBING) ×2 IMPLANT
TUBING NON-CON 1/4 X 20' CONN (TUBING) ×1
YANKAUER SUCT BULB TIP NO VENT (SUCTIONS) ×3 IMPLANT

## 2016-09-27 NOTE — Interval H&P Note (Signed)
History and Physical Interval Note:  09/27/2016 9:50 AM  Kathryn Howard  has presented today for surgery, with the diagnosis of Incompetent Cervix with history of Preterm labor  The various methods of treatment have been discussed with the patient and family. After consideration of risks, benefits and other options for treatment, the patient has consented to  Procedure(s): CERCLAGE CERVICAL (N/A) as a surgical intervention .  The patient's history has been reviewed, patient examined, no change in status, stable for surgery.  I have reviewed the patient's chart and labs.  Questions were answered to the patient's satisfaction.     Purcell NailsOBERTS,Arlyn Buerkle Y

## 2016-09-27 NOTE — Op Note (Signed)
Preop Diagnosis: 1. 13 1/7wks 2.Incompetent Cervix   Postop Diagnosis: 1.13 1/7wks 2.Incompetent cervix   Procedure: CERCLAGE CERVICAL   Anesthesia: Spinal   Anesthesiologist: Lowella CurbMiller, Warren Ray, MD   Attending: Osborn Cohooberts, Phoua Hoadley, MD   Assistant: N/a  Findings: Cervix closed  Pathology: N/a  Fluids: 500 cc  UOP: 25 cc  EBL: 10 cc   Complications: None  Procedure:Then patient was taken to the operating room after the risks, benefits and alternatives discussed with the patient and consent signed and witnessed.  The patient was given a spinal per anesthesia and placed in the dorsal lithotomy position.  The patient was prepped and draped in the usual sterile fashion.  A cervical cerclage stitch was placed using Mersilene and the knot was tied anteriorly on the cervix with a stitch of 1 prolene at the base to help elevate knot if necessary when it comes time for removal.  Clindamycin douche was performed.  Membranes remained intact and post procedure fetal heart rate was 140.  Sponge, lap and needle count was correct and the patient was transferred to the recovery room in good condition.

## 2016-09-27 NOTE — H&P (Signed)
Kathryn Howard is an 41 y.o. female. Pt well known to me with a h/o incompetent cervix here today for scheduled procedure for placement of cervical cerclage.      Past Medical History:  Diagnosis Date  . Anemia   . Anxiety    no meds  . Asthma   . Depression    stopped meds 2012  . Diabetes mellitus    type 2  . DUB (dysfunctional uterine bleeding)   . Fatty liver   . Gastroparesis   . GERD (gastroesophageal reflux disease)    with pregnancy  . H pylori ulcer   . H/O hematuria 03/14/11  . H/O rubella   . H/O seasonal allergies   . H/O varicella   . Headache    otc med prn  . History of anxiety   . History of blood transfusion 09/2011   [facility] - 2 units transfused  . History of ovarian cyst   . History of PCOS 03/07/10  . Hypothyroidism   . Low blood pressure   . Obesity   . Oligomenorrhea 09/12/10  . Pelvic pain 03/14/11   right sided back  . Vitamin D deficiency   . Yeast infection     Past Surgical History:  Procedure Laterality Date  . CERVICAL CERCLAGE  09/15/2011   Procedure: CERCLAGE CERVICAL;  Surgeon: Kirkland Hun, MD;  Location: [facility];  Service: Gynecology;  Laterality: N/A;  . CERVICAL CERCLAGE N/A 12/31/2013   Procedure: CERCLAGE CERVICAL;  Surgeon: Kirkland Hun, MD;  Location: [facility];  Service: Gynecology;  Laterality: N/A;  . CERVICAL CERCLAGE N/A 06/25/2014   Procedure: REMOVAL OF CERCLAGE CERVICAL;  Surgeon: [name], MD;  Location: [facility];  Service: Obstetrics;  Laterality: N/A;  . CESAREAN SECTION  10/18/2011   Procedure: CESAREAN SECTION;  Surgeon: [name], MD;  Location: [facility];  Service: Gynecology;  Laterality: N/A;  . CESAREAN SECTION N/A 06/25/2014   Procedure: REPEAT CESAREAN SECTION;  Surgeon: [name], MD;  Location: [facility];  Service: Obstetrics;  Laterality: N/A;  . DILATION AND CURETTAGE OF UTERUS  2008   [facility]  . UPPER GI ENDOSCOPY      Family History  Problem Relation Age of Onset  . Diabetes Father   .  Hypertension Father   . Diabetes Mother   . Hypertension Mother   . Hyperlipidemia Mother     Social History:  reports that she has never smoked. She has never used smokeless tobacco. She reports that she does not drink alcohol or use drugs.  Allergies:  Allergies  Allergen Reactions  . Sulfa Antibiotics Shortness Of Breath and Swelling    Unknown agent in ear drops Swelling tongue and throat    Prescriptions Prior to Admission  Medication Sig Dispense Refill Last Dose  . Cholecalciferol (HM VITAMIN D3) 4000 units CAPS Take 4,000 Units by mouth daily.   09/26/2016 at Unknown time  . insulin glargine (LANTUS) 100 UNIT/ML injection Inject 0.06 mLs (6 Units total) into the skin daily with supper. (Patient taking differently: Inject 20 Units into the skin daily with supper. ) 10 mL 11 09/26/2016 at 2030  . insulin lispro (HUMALOG) 100 UNIT/ML KiwkPen Inject 10-15 Units into the skin 3 (three) times daily before meals. 10 units at breakfast, 12 units at lunch and 15 units at dinner   09/26/2016 at 2030  . levothyroxine (SYNTHROID, LEVOTHROID) 100 MCG tablet Take 100 mcg by mouth daily before breakfast.   09/27/2016 at 0715  .  metFORMIN (FORTAMET) 1000 MG (OSM) 24 hr tablet Take 1 tablet (1,000 mg total) by mouth 2 (two) times daily with a meal. (Patient taking differently: Take 1,000 mg by mouth daily before supper. ) 60 tablet 1 09/26/2016 at 2030  . Prenatal Vit-Fe Fumarate-FA (PRENATAL MULTIVITAMIN) TABS Take 1 tablet by mouth daily.    09/26/2016 at Unknown time  . PROAIR RESPICLICK 108 (90 Base) MCG/ACT AEPB Inhale 2 puffs into the lungs 4 (four) times daily as needed (shortness of breath).   0 Past Month at Unknown time  . azithromycin (ZITHROMAX) 500 MG tablet Take 1 tablet (500 mg total) by mouth daily. If you have 3+ loose stools in 24hr. Can stop taking if diarrhea resolves (Patient not taking: Reported on 09/15/2016) 6 tablet 0 Not Taking at Unknown time  . ferrous sulfate 325 (65 FE) MG EC  tablet Take 1 tablet (325 mg total) by mouth 2 (two) times daily. (Patient not taking: Reported on 09/15/2016) 60 tablet 3 Not Taking at Unknown time  . ibuprofen (ADVIL,MOTRIN) 600 MG tablet Take 1 tablet (600 mg total) by mouth every 6 (six) hours as needed for mild pain. (Patient not taking: Reported on 09/15/2016) 30 tablet 0 Not Taking at Unknown time  . mefloquine (LARIAM) 250 MG tablet Take 1 tablet (250 mg total) by mouth every 7 (seven) days. Start 2 wks before leaving, take weekly until 4 wk upon return (Patient not taking: Reported on 09/15/2016) 16 tablet 0 Not Taking at Unknown time  . oxyCODONE-acetaminophen (PERCOCET/ROXICET) 5-325 MG per tablet Take 1 tablet by mouth every 4 (four) hours as needed (for pain scale less than 7). (Patient not taking: Reported on 09/15/2016) 30 tablet 0 Not Taking at Unknown time  . senna-docusate (SENOKOT-S) 8.6-50 MG per tablet Take 2 tablets by mouth daily. (Patient not taking: Reported on 09/15/2016) 60 tablet 1 Not Taking at Unknown time  . simethicone (MYLICON) 80 MG chewable tablet Chew 1 tablet (80 mg total) by mouth 3 (three) times daily after meals. (Patient not taking: Reported on 09/15/2016) 30 tablet 0 Not Taking at Unknown time    ROS Denies f/c/n/v/abdominal pain/abnormal discharge  Blood pressure 122/76, pulse 89, temperature 97.8 F (36.6 C), temperature source Oral, resp. rate 16, SpO2 100 %, unknown if currently breastfeeding. Physical Exam  Lungs CTA CV RRR Abd soft, NT Ext no calf tenderness  Results for orders placed or performed during the hospital encounter of 09/27/16 (from the past 24 hour(s))  Glucose, capillary     Status: Abnormal   Collection Time: 09/27/16  8:15 AM  Result Value Ref Range   Glucose-Capillary 106 (H) 65 - 99 mg/dL    No results found.  Assessment/Plan: G3P1 with h/o incompetent cervix presenting for scheduled cerclage.  R/B/A discussed with patient including but not limited to bleeding infection, ROM  and loss of pregnancy.  Consent signed and witnessed.  Kathryn Howard Y 09/27/2016, 9:31 AM

## 2016-09-27 NOTE — Transfer of Care (Signed)
Immediate Anesthesia Transfer of Care Note  Patient: Kathryn Howard  Procedure(s) Performed: Procedure(s): CERCLAGE CERVICAL (N/A)  Patient Location: PACU  Anesthesia Type:Spinal  Level of Consciousness: awake, alert  and oriented  Airway & Oxygen Therapy: Patient Spontanous Breathing  Post-op Assessment: Report given to RN and Post -op Vital signs reviewed and stable  Post vital signs: Reviewed and stable  Last Vitals:  Vitals:   09/27/16 0808  BP: 122/76  Pulse: 89  Resp: 16  Temp: 36.6 C    Last Pain:  Vitals:   09/27/16 0808  TempSrc: Oral      Patients Stated Pain Goal: 3 (09/27/16 [id number])  Complications: No apparent anesthesia complications

## 2016-09-27 NOTE — Discharge Instructions (Addendum)
Cervical Cerclage, Care After This sheet gives you information about how to care for yourself after your procedure. Your health care provider may also give you more specific instructions. If you have problems or questions, contact your health care provider. What can I expect after the procedure? After your procedure, it is common to have:  Cramping in your abdomen.  Mucus discharge for several days.  Painful urination (dysuria).  Small drops of blood coming from your vagina (spotting). Follow these instructions at home:  Follow instructions from your health care provider about bed rest, if this applies. You may need to be on bed rest for up to 3 days.  Take over-the-counter and prescription medicines only as told by your health care provider.  Do not drive or use heavy machinery while taking prescription pain medicine.  Keep track of your vaginal discharge and watch for any changes. If you notice changes, tell your health care provider.  Avoid physical activities and exercise until your health care provider approves. Ask your health care provider what activities are safe for you.  Until your health care provider approves:  Do not douche.  Do not have sexual intercourse.  Keep all pre-birth (prenatal) visits and all follow-up visits as told by your health care provider. This is important. You will probably have weekly visits to have your cervix checked, and you may need an ultrasound. Contact a health care provider if:  You have abnormal or bad-smelling vaginal discharge, such as clots.  You develop a rash on your skin. This may look like redness and swelling.  You become light-headed or feel like you are going to faint.  You have abdominal pain that does not get better with medicine.  You have persistent nausea or vomiting. Get help right away if:  You have vaginal bleeding that is heavier or more frequent than spotting.  You are leaking fluid or have a gush of fluid  from your vagina (your water breaks).  You have a fever or chills.  You faint.  You have uterine contractions. These may feel like:  A back ache.  Lower abdominal pain.  Mild cramps, similar to menstrual cramps.  Tightening or pressure in your abdomen.  You think that your baby is not moving as much as usual, or you cannot feel your baby move.  You have chest pain.  You have shortness of breath. This information is not intended to replace advice given to you by your health care provider. Make sure you discuss any questions you have with your health care provider. Document Released: 02/05/2013 Document Revised: 12/15/2015 Document Reviewed: 11/19/2015 Elsevier Interactive Patient Education  2017 [employer]   Post Anesthesia Home Care Instructions  Activity: Get plenty of rest for the remainder of the day. A responsible individual must stay with you for 24 hours following the procedure.  For the next 24 hours, DO NOT: -Drive a car -Advertising copywriterperate machinery -Drink alcoholic beverages -Take any medication unless instructed by your physician -Make any legal decisions or sign important papers.  Meals: Start with liquid foods such as gelatin or soup. Progress to regular foods as tolerated. Avoid greasy, spicy, heavy foods. If nausea and/or vomiting occur, drink only clear liquids until the nausea and/or vomiting subsides. Call your physician if vomiting continues.

## 2016-09-27 NOTE — Anesthesia Postprocedure Evaluation (Signed)
Anesthesia Post Note  Patient: Kathryn Howard  Procedure(s) Performed: Procedure(s) (LRB): CERCLAGE CERVICAL (N/A)  Patient location during evaluation: PACU Anesthesia Type: Spinal Level of consciousness: oriented and awake and alert Pain management: pain level controlled Vital Signs Assessment: post-procedure vital signs reviewed and stable Respiratory status: spontaneous breathing and respiratory function stable Cardiovascular status: blood pressure returned to baseline and stable Postop Assessment: no headache and no backache Anesthetic complications: no        Last Vitals:  Vitals:   09/27/16 1130 09/27/16 1145  BP: (!) 100/58 106/63  Pulse: 81 91  Resp: 15 15  Temp:      Last Pain:  Vitals:   09/27/16 1200  TempSrc:   PainSc: (P) Asleep   Pain Goal: Patients Stated Pain Goal: (P) 3 (09/27/16 1200)               [name]

## 2016-09-27 NOTE — Anesthesia Preprocedure Evaluation (Signed)
Anesthesia Evaluation  Patient identified by MRN, date of birth, ID band Patient awake    Reviewed: Allergy & Precautions, H&P , NPO status , Patient's Chart, lab work & pertinent test results, reviewed documented beta blocker date and time   History of Anesthesia Complications Negative for: history of anesthetic complications  Airway Mallampati: III  TM Distance: >3 FB Neck ROM: full    Dental  (+) Teeth Intact   Pulmonary neg pulmonary ROS,    breath sounds clear to auscultation       Cardiovascular negative cardio ROS   Rhythm:regular Rate:Normal     Neuro/Psych PSYCHIATRIC DISORDERS (h/o anxiety/depression) negative neurological ROS     GI/Hepatic Neg liver ROS, PUD, GERD (with pregnancy)  ,  Endo/Other  diabetes, Type 2, Oral Hypoglycemic Agents, Insulin DependentMorbid obesity  Renal/GU negative Renal ROS  negative genitourinary   Musculoskeletal   Abdominal   Peds  Hematology negative hematology ROS (+)   Anesthesia Other Findings   Reproductive/Obstetrics (+) Pregnancy (18 week, incompetent cervix)                             Anesthesia Physical  Anesthesia Plan  ASA: III  Anesthesia Plan: Spinal   Post-op Pain Management:    Induction:   Airway Management Planned:   Additional Equipment:   Intra-op Plan:   Post-operative Plan:   Informed Consent: I have reviewed the patients History and Physical, chart, labs and discussed the procedure including the risks, benefits and alternatives for the proposed anesthesia with the patient or authorized representative who has indicated his/her understanding and acceptance.     Plan Discussed with: Surgeon and CRNA  Anesthesia Plan Comments:         Anesthesia Quick Evaluation

## 2016-09-27 NOTE — Anesthesia Procedure Notes (Signed)
Spinal  Patient location during procedure: OB Start time: 09/27/2016 10:39 AM End time: 09/27/2016 10:44 AM Staffing Anesthesiologist: Anitra LauthMILLER, Mykeria Garman RAY Performed: anesthesiologist  Preanesthetic Checklist Completed: patient identified, surgical consent, pre-op evaluation, timeout performed, IV checked, risks and benefits discussed and monitors and equipment checked Spinal Block Patient position: sitting Prep: Betadine and site prepped and draped Patient monitoring: heart rate, cardiac monitor, continuous pulse ox and blood pressure Approach: midline Location: L3-4 Injection technique: single-shot Needle Needle type: Pencan  Needle gauge: 24 G Needle length: 10 cm Assessment Sensory level: T4

## 2016-09-28 ENCOUNTER — Encounter (HOSPITAL_COMMUNITY): Payer: Self-pay | Admitting: Obstetrics and Gynecology

## 2016-09-29 NOTE — Addendum Note (Signed)
Addendum  created 09/29/16 1316 by Algis GreenhouseBurger, Charitie Hinote A, CRNA   Charge Capture section accepted

## 2016-10-04 DIAGNOSIS — Z3A14 14 weeks gestation of pregnancy: Secondary | ICD-10-CM | POA: Diagnosis not present

## 2016-10-04 DIAGNOSIS — Z3402 Encounter for supervision of normal first pregnancy, second trimester: Secondary | ICD-10-CM | POA: Diagnosis not present

## 2016-10-04 DIAGNOSIS — O909 Complication of the puerperium, unspecified: Secondary | ICD-10-CM | POA: Diagnosis not present

## 2016-10-05 DIAGNOSIS — F331 Major depressive disorder, recurrent, moderate: Secondary | ICD-10-CM | POA: Diagnosis not present

## 2016-10-10 DIAGNOSIS — O909 Complication of the puerperium, unspecified: Secondary | ICD-10-CM | POA: Diagnosis not present

## 2016-10-10 DIAGNOSIS — Z3A15 15 weeks gestation of pregnancy: Secondary | ICD-10-CM | POA: Diagnosis not present

## 2016-10-12 DIAGNOSIS — O09219 Supervision of pregnancy with history of pre-term labor, unspecified trimester: Secondary | ICD-10-CM | POA: Diagnosis not present

## 2016-10-13 DIAGNOSIS — F331 Major depressive disorder, recurrent, moderate: Secondary | ICD-10-CM | POA: Diagnosis not present

## 2016-10-16 DIAGNOSIS — Z3402 Encounter for supervision of normal first pregnancy, second trimester: Secondary | ICD-10-CM | POA: Diagnosis not present

## 2016-10-16 DIAGNOSIS — Z369 Encounter for antenatal screening, unspecified: Secondary | ICD-10-CM | POA: Diagnosis not present

## 2016-10-16 DIAGNOSIS — N898 Other specified noninflammatory disorders of vagina: Secondary | ICD-10-CM | POA: Diagnosis not present

## 2016-10-16 DIAGNOSIS — R208 Other disturbances of skin sensation: Secondary | ICD-10-CM | POA: Diagnosis not present

## 2016-10-16 DIAGNOSIS — Z3A15 15 weeks gestation of pregnancy: Secondary | ICD-10-CM | POA: Diagnosis not present

## 2016-10-18 DIAGNOSIS — E1165 Type 2 diabetes mellitus with hyperglycemia: Secondary | ICD-10-CM | POA: Diagnosis not present

## 2016-10-18 DIAGNOSIS — E039 Hypothyroidism, unspecified: Secondary | ICD-10-CM | POA: Diagnosis not present

## 2016-10-18 DIAGNOSIS — E119 Type 2 diabetes mellitus without complications: Secondary | ICD-10-CM | POA: Diagnosis not present

## 2016-10-19 DIAGNOSIS — O09219 Supervision of pregnancy with history of pre-term labor, unspecified trimester: Secondary | ICD-10-CM | POA: Diagnosis not present

## 2016-10-20 DIAGNOSIS — F331 Major depressive disorder, recurrent, moderate: Secondary | ICD-10-CM | POA: Diagnosis not present

## 2016-10-26 DIAGNOSIS — O09219 Supervision of pregnancy with history of pre-term labor, unspecified trimester: Secondary | ICD-10-CM | POA: Diagnosis not present

## 2016-10-26 DIAGNOSIS — O909 Complication of the puerperium, unspecified: Secondary | ICD-10-CM | POA: Diagnosis not present

## 2016-10-26 DIAGNOSIS — N898 Other specified noninflammatory disorders of vagina: Secondary | ICD-10-CM | POA: Diagnosis not present

## 2016-10-26 DIAGNOSIS — Z3A17 17 weeks gestation of pregnancy: Secondary | ICD-10-CM | POA: Diagnosis not present

## 2016-10-27 DIAGNOSIS — F331 Major depressive disorder, recurrent, moderate: Secondary | ICD-10-CM | POA: Diagnosis not present

## 2016-11-02 DIAGNOSIS — Z3A18 18 weeks gestation of pregnancy: Secondary | ICD-10-CM | POA: Diagnosis not present

## 2016-11-02 DIAGNOSIS — Z8751 Personal history of pre-term labor: Secondary | ICD-10-CM | POA: Diagnosis not present

## 2016-11-02 DIAGNOSIS — Z3402 Encounter for supervision of normal first pregnancy, second trimester: Secondary | ICD-10-CM | POA: Diagnosis not present

## 2016-11-03 DIAGNOSIS — F331 Major depressive disorder, recurrent, moderate: Secondary | ICD-10-CM | POA: Diagnosis not present

## 2016-11-07 DIAGNOSIS — O09219 Supervision of pregnancy with history of pre-term labor, unspecified trimester: Secondary | ICD-10-CM | POA: Diagnosis not present

## 2016-11-10 DIAGNOSIS — N898 Other specified noninflammatory disorders of vagina: Secondary | ICD-10-CM | POA: Diagnosis not present

## 2016-11-10 DIAGNOSIS — O09219 Supervision of pregnancy with history of pre-term labor, unspecified trimester: Secondary | ICD-10-CM | POA: Diagnosis not present

## 2016-11-15 ENCOUNTER — Other Ambulatory Visit (HOSPITAL_COMMUNITY): Payer: Self-pay | Admitting: Obstetrics and Gynecology

## 2016-11-15 DIAGNOSIS — O09522 Supervision of elderly multigravida, second trimester: Secondary | ICD-10-CM

## 2016-11-15 DIAGNOSIS — O99212 Obesity complicating pregnancy, second trimester: Secondary | ICD-10-CM

## 2016-11-15 DIAGNOSIS — O99012 Anemia complicating pregnancy, second trimester: Secondary | ICD-10-CM

## 2016-11-15 DIAGNOSIS — O99282 Endocrine, nutritional and metabolic diseases complicating pregnancy, second trimester: Secondary | ICD-10-CM

## 2016-11-15 DIAGNOSIS — O24112 Pre-existing diabetes mellitus, type 2, in pregnancy, second trimester: Secondary | ICD-10-CM

## 2016-11-15 DIAGNOSIS — Z3A23 23 weeks gestation of pregnancy: Secondary | ICD-10-CM

## 2016-11-15 DIAGNOSIS — O34219 Maternal care for unspecified type scar from previous cesarean delivery: Secondary | ICD-10-CM

## 2016-11-15 DIAGNOSIS — Z3689 Encounter for other specified antenatal screening: Secondary | ICD-10-CM

## 2016-11-15 DIAGNOSIS — E039 Hypothyroidism, unspecified: Secondary | ICD-10-CM

## 2016-11-16 DIAGNOSIS — O09219 Supervision of pregnancy with history of pre-term labor, unspecified trimester: Secondary | ICD-10-CM | POA: Diagnosis not present

## 2016-11-17 DIAGNOSIS — F331 Major depressive disorder, recurrent, moderate: Secondary | ICD-10-CM | POA: Diagnosis not present

## 2016-11-24 DIAGNOSIS — O09219 Supervision of pregnancy with history of pre-term labor, unspecified trimester: Secondary | ICD-10-CM | POA: Diagnosis not present

## 2016-11-24 DIAGNOSIS — N898 Other specified noninflammatory disorders of vagina: Secondary | ICD-10-CM | POA: Diagnosis not present

## 2016-11-24 DIAGNOSIS — F331 Major depressive disorder, recurrent, moderate: Secondary | ICD-10-CM | POA: Diagnosis not present

## 2016-11-24 DIAGNOSIS — R208 Other disturbances of skin sensation: Secondary | ICD-10-CM | POA: Diagnosis not present

## 2016-11-30 DIAGNOSIS — O09219 Supervision of pregnancy with history of pre-term labor, unspecified trimester: Secondary | ICD-10-CM | POA: Diagnosis not present

## 2016-12-01 DIAGNOSIS — F331 Major depressive disorder, recurrent, moderate: Secondary | ICD-10-CM | POA: Diagnosis not present

## 2016-12-05 ENCOUNTER — Encounter (HOSPITAL_COMMUNITY): Payer: Self-pay | Admitting: *Deleted

## 2016-12-06 DIAGNOSIS — E1165 Type 2 diabetes mellitus with hyperglycemia: Secondary | ICD-10-CM | POA: Diagnosis not present

## 2016-12-06 DIAGNOSIS — E039 Hypothyroidism, unspecified: Secondary | ICD-10-CM | POA: Diagnosis not present

## 2016-12-06 DIAGNOSIS — E119 Type 2 diabetes mellitus without complications: Secondary | ICD-10-CM | POA: Diagnosis not present

## 2016-12-07 ENCOUNTER — Other Ambulatory Visit (HOSPITAL_COMMUNITY): Payer: Self-pay | Admitting: Obstetrics and Gynecology

## 2016-12-07 ENCOUNTER — Encounter (HOSPITAL_COMMUNITY): Payer: Self-pay

## 2016-12-07 ENCOUNTER — Ambulatory Visit (HOSPITAL_COMMUNITY)
Admission: RE | Admit: 2016-12-07 | Discharge: 2016-12-07 | Disposition: A | Discharge status: Home / Self Care | Payer: BLUE CROSS/BLUE SHIELD | Source: Ambulatory Visit | Attending: Obstetrics and Gynecology | Admitting: Obstetrics and Gynecology

## 2016-12-07 DIAGNOSIS — O09212 Supervision of pregnancy with history of pre-term labor, second trimester: Secondary | ICD-10-CM | POA: Diagnosis not present

## 2016-12-07 DIAGNOSIS — O09892 Supervision of other high risk pregnancies, second trimester: Secondary | ICD-10-CM

## 2016-12-07 DIAGNOSIS — Z3A23 23 weeks gestation of pregnancy: Secondary | ICD-10-CM

## 2016-12-07 DIAGNOSIS — O09522 Supervision of elderly multigravida, second trimester: Secondary | ICD-10-CM

## 2016-12-07 DIAGNOSIS — O99612 Diseases of the digestive system complicating pregnancy, second trimester: Secondary | ICD-10-CM | POA: Diagnosis not present

## 2016-12-07 DIAGNOSIS — E559 Vitamin D deficiency, unspecified: Secondary | ICD-10-CM | POA: Insufficient documentation

## 2016-12-07 DIAGNOSIS — F329 Major depressive disorder, single episode, unspecified: Secondary | ICD-10-CM

## 2016-12-07 DIAGNOSIS — O09219 Supervision of pregnancy with history of pre-term labor, unspecified trimester: Secondary | ICD-10-CM | POA: Diagnosis not present

## 2016-12-07 DIAGNOSIS — O24112 Pre-existing diabetes mellitus, type 2, in pregnancy, second trimester: Secondary | ICD-10-CM

## 2016-12-07 DIAGNOSIS — O3503X0 Maternal care for (suspected) central nervous system malformation or damage in fetus, choroid plexus cysts, not applicable or unspecified: Secondary | ICD-10-CM

## 2016-12-07 DIAGNOSIS — O99212 Obesity complicating pregnancy, second trimester: Secondary | ICD-10-CM

## 2016-12-07 DIAGNOSIS — O34219 Maternal care for unspecified type scar from previous cesarean delivery: Secondary | ICD-10-CM

## 2016-12-07 DIAGNOSIS — O350XX0 Maternal care for (suspected) central nervous system malformation in fetus, not applicable or unspecified: Secondary | ICD-10-CM

## 2016-12-07 DIAGNOSIS — E039 Hypothyroidism, unspecified: Secondary | ICD-10-CM

## 2016-12-07 DIAGNOSIS — F418 Other specified anxiety disorders: Secondary | ICD-10-CM | POA: Insufficient documentation

## 2016-12-07 DIAGNOSIS — O99282 Endocrine, nutritional and metabolic diseases complicating pregnancy, second trimester: Secondary | ICD-10-CM | POA: Diagnosis not present

## 2016-12-07 DIAGNOSIS — Z794 Long term (current) use of insulin: Secondary | ICD-10-CM | POA: Diagnosis not present

## 2016-12-07 DIAGNOSIS — O99342 Other mental disorders complicating pregnancy, second trimester: Secondary | ICD-10-CM | POA: Diagnosis not present

## 2016-12-07 DIAGNOSIS — F32A Depression, unspecified: Secondary | ICD-10-CM

## 2016-12-07 DIAGNOSIS — O3432 Maternal care for cervical incompetence, second trimester: Secondary | ICD-10-CM

## 2016-12-07 DIAGNOSIS — Z98891 History of uterine scar from previous surgery: Secondary | ICD-10-CM

## 2016-12-07 DIAGNOSIS — Z3689 Encounter for other specified antenatal screening: Secondary | ICD-10-CM

## 2016-12-07 DIAGNOSIS — K219 Gastro-esophageal reflux disease without esophagitis: Secondary | ICD-10-CM | POA: Diagnosis not present

## 2016-12-07 DIAGNOSIS — O99512 Diseases of the respiratory system complicating pregnancy, second trimester: Secondary | ICD-10-CM | POA: Insufficient documentation

## 2016-12-07 DIAGNOSIS — J45909 Unspecified asthma, uncomplicated: Secondary | ICD-10-CM | POA: Insufficient documentation

## 2016-12-07 DIAGNOSIS — O09299 Supervision of pregnancy with other poor reproductive or obstetric history, unspecified trimester: Secondary | ICD-10-CM

## 2016-12-07 DIAGNOSIS — E119 Type 2 diabetes mellitus without complications: Secondary | ICD-10-CM | POA: Diagnosis not present

## 2016-12-07 DIAGNOSIS — O99012 Anemia complicating pregnancy, second trimester: Secondary | ICD-10-CM

## 2016-12-07 NOTE — Progress Notes (Signed)
Maternal Fetal Medicine Consultation  Requesting Provider(s): [facility]  Primary OB: Rivard Reason for consultation: AMA, history of PTD, cerclage in situ, type 2 DM  HPI: 41yo P1101 at 23+2 weeks referred for evaluation after [place] in office showed choroid plexus cysts. The patient has the following complications of pregnancy:  1. Type 2 DM: she is under the care of a local endocrinologist, Dr. Chestine Spore who is seeing her every 2 weeks for insulin adjustments. She is currently on metformin 1000mg  PO qD, Lantus 22 unit at HS and Humalog 10 units with breakfast, 12 units with lunch and 15 units at dinner. She has a HgbA1C of 6.5% 3 weeks ago. She is planning to increase the Lantus to 25 units if her fasting remain elevated in the 105-108 mg/dl range this week. She has not undergone a fetal echocardiogram yet.  2. AMA: the patient underwent NIPT with the Panorama test which was not resulted due to low fetal DNA. She declined repeat testing. She had a quad screen that she was told was "normal". I do not have a copy of that report. She declined further testing after the "normal" quad screen. Our scan today shows no markers for aneuploidy  3. History of PTD: in 2013 she had an emergency cerclage placed at 18 weeks, but was found to be fully dilated at 23 weeks and underwent classical C/S. That infant died in the neonatal period. During her second pregnancy in 2015 she had a history-indicated cerclage at 12 weeks and progressed to 37 weeks without incident when she underwent repeat C/S. She had a second history-indicated cerclage during this pregnancy at 13 weeks, and is also on 17-hydroxyprogesterone injections weekly. She has been undergoing cervical length evaluations periodically with normal lengths noted. Our evaluation showed a 39mm cervix with cerclage intact in the distal third of the cervix  4. Choroid plexus cysts: these were seen on office [place]. We were unable to identify CPCs on our scan  today, but our intracranial visualization was suboptimal due to body habitus, abdominal scarring and fetal position  5. Maternal depression: She has a history of fairly severe postpartum depression and is currently reporting symptoms of depression and anxiety. She is undergoing counseling at the [facility] with good results as reported by the patient. She has declined pharmacologic treatment for her depression at this time   OB History: OB History    Gravida Para Term Preterm AB Living   4 2 1 1 1 1    SAB TAB Ectopic Multiple Live Births   1 0 0 0 1      PMH:  Past Medical History:  Diagnosis Date  . Anemia   . Anxiety    no meds  . Asthma   . Depression    stopped meds 2012  . Diabetes mellitus    type 2  . DUB (dysfunctional uterine bleeding)   . Fatty liver   . Gastroparesis   . GERD (gastroesophageal reflux disease)    with pregnancy  . H pylori ulcer   . H/O hematuria 03/14/11  . H/O rubella   . H/O seasonal allergies   . H/O varicella   . Headache    otc med prn  . History of anxiety   . History of blood transfusion 09/2011   [facility] - 2 units transfused  . History of ovarian cyst   . History of PCOS 03/07/10  . Hypothyroidism   . Low blood pressure   . Obesity   .  Oligomenorrhea 09/12/10  . Pelvic pain 03/14/11   right sided back  . Vitamin D deficiency   . Yeast infection     PSH:  Past Surgical History:  Procedure Laterality Date  . CERVICAL CERCLAGE  09/15/2011   Procedure: CERCLAGE CERVICAL;  Surgeon: Kirkland HunArthur Stringer, MD;  Location: [facility];  Service: Gynecology;  Laterality: N/A;  . CERVICAL CERCLAGE N/A 12/31/2013   Procedure: CERCLAGE CERVICAL;  Surgeon: Kirkland HunArthur Stringer, MD;  Location: [facility];  Service: Gynecology;  Laterality: N/A;  . CERVICAL CERCLAGE N/A 06/25/2014   Procedure: REMOVAL OF CERCLAGE CERVICAL;  Surgeon: Purcell NailsAngela Y Roberts, MD;  Location: [facility];  Service: Obstetrics;  Laterality: N/A;  . CERVICAL CERCLAGE N/A 09/27/2016   Procedure:  CERCLAGE CERVICAL;  Surgeon: Osborn Cohooberts, Angela, MD;  Location: [facility];  Service: Gynecology;  Laterality: N/A;  . CESAREAN SECTION  10/18/2011   Procedure: CESAREAN SECTION;  Surgeon: Purcell NailsAngela Y Roberts, MD;  Location: [facility];  Service: Gynecology;  Laterality: N/A;  . CESAREAN SECTION N/A 06/25/2014   Procedure: REPEAT CESAREAN SECTION;  Surgeon: Purcell NailsAngela Y Roberts, MD;  Location: [facility];  Service: Obstetrics;  Laterality: N/A;  . DILATION AND CURETTAGE OF UTERUS  2008   MAB  . UPPER GI ENDOSCOPY     Meds: See EPIC section Allergies: Sulfa FH: See EPIC section Soc: See EPIC section  Review of Systems: no vaginal bleeding or cramping/contractions, no LOF, no nausea/vomiting. All other systems reviewed and are negative.  PE:  VS: See EPIC section GEN: well-appearing female ABD: gravid, NT  Please see separate document for fetal ultrasound report.  A/P: 1. Type 2 Diabetes: we will of course defer management of this patient's diabetes to her established endocrinologist. I have gone ahead and ordered a fetal echocardiogram, and per the patient's [facility]. She needs growth evaluations every 4 weeks and antepartum testing from 30-32 weeks until delivery.  2. AMA: I let the patient know that repeating her NIPT would have a >85% chance of giving her a reportable result. She is content to not repeat the test at this time.   3. History of PTD: the patient is already receiving maximum intervention for preterm birth prevention. We would recommend delivery by repeat C/S between 36-[redacted] weeks gestation due to her history of classical C/S.  4. CPCs: the presence of CPCs increases the risk for aneuploidy. For a 41 yo woman with a negative quad screen the risk is felt to be c. 1 in 1000. If she wished further reassurance. NIPT could decrease that risk to c. 1 in 10,000. After a discussion, the patient is content with her current level of testing  5. Depression: the patient denied  severe or worsening depressive symptoms and state that the counseling is helping a great deal. She continues to shun pharmacologic intervention at this time. I did mention to her that if she was interested in a "natural" intervention, St. John's Wort at 900mg  per day (usually in the form of 3 300mg  supplement capsules) has been shown to improve mood and relieve depressive symptoms and is generally held to be safe in pregnancy. She is uninterested at this time  Thank you for the opportunity to be a part of the care of Kathryn Howard. Please contact our office if we can be of further assistance.   I spent approximately 30 minutes with this patient with over 50% of time spent in face-to-face counseling.

## 2016-12-08 DIAGNOSIS — F331 Major depressive disorder, recurrent, moderate: Secondary | ICD-10-CM | POA: Diagnosis not present

## 2016-12-13 DIAGNOSIS — O09219 Supervision of pregnancy with history of pre-term labor, unspecified trimester: Secondary | ICD-10-CM | POA: Diagnosis not present

## 2016-12-14 DIAGNOSIS — O09219 Supervision of pregnancy with history of pre-term labor, unspecified trimester: Secondary | ICD-10-CM | POA: Diagnosis not present

## 2016-12-21 ENCOUNTER — Encounter (HOSPITAL_COMMUNITY): Payer: Self-pay

## 2016-12-21 DIAGNOSIS — R300 Dysuria: Secondary | ICD-10-CM | POA: Diagnosis not present

## 2016-12-21 DIAGNOSIS — Z3A25 25 weeks gestation of pregnancy: Secondary | ICD-10-CM | POA: Diagnosis not present

## 2016-12-21 DIAGNOSIS — O09219 Supervision of pregnancy with history of pre-term labor, unspecified trimester: Secondary | ICD-10-CM | POA: Diagnosis not present

## 2016-12-21 DIAGNOSIS — Z3492 Encounter for supervision of normal pregnancy, unspecified, second trimester: Secondary | ICD-10-CM | POA: Diagnosis not present

## 2016-12-21 DIAGNOSIS — O24119 Pre-existing diabetes mellitus, type 2, in pregnancy, unspecified trimester: Secondary | ICD-10-CM | POA: Diagnosis not present

## 2016-12-21 DIAGNOSIS — O26879 Cervical shortening, unspecified trimester: Secondary | ICD-10-CM | POA: Diagnosis not present

## 2016-12-22 DIAGNOSIS — F331 Major depressive disorder, recurrent, moderate: Secondary | ICD-10-CM | POA: Diagnosis not present

## 2016-12-28 DIAGNOSIS — O09219 Supervision of pregnancy with history of pre-term labor, unspecified trimester: Secondary | ICD-10-CM | POA: Diagnosis not present

## 2016-12-29 DIAGNOSIS — F331 Major depressive disorder, recurrent, moderate: Secondary | ICD-10-CM | POA: Diagnosis not present

## 2017-01-03 DIAGNOSIS — Z3A27 27 weeks gestation of pregnancy: Secondary | ICD-10-CM | POA: Diagnosis not present

## 2017-01-03 DIAGNOSIS — O09219 Supervision of pregnancy with history of pre-term labor, unspecified trimester: Secondary | ICD-10-CM | POA: Diagnosis not present

## 2017-01-03 DIAGNOSIS — Z3492 Encounter for supervision of normal pregnancy, unspecified, second trimester: Secondary | ICD-10-CM | POA: Diagnosis not present

## 2017-01-05 DIAGNOSIS — F331 Major depressive disorder, recurrent, moderate: Secondary | ICD-10-CM | POA: Diagnosis not present

## 2017-01-09 DIAGNOSIS — F331 Major depressive disorder, recurrent, moderate: Secondary | ICD-10-CM | POA: Diagnosis not present

## 2017-01-10 DIAGNOSIS — O09219 Supervision of pregnancy with history of pre-term labor, unspecified trimester: Secondary | ICD-10-CM | POA: Diagnosis not present

## 2017-01-10 DIAGNOSIS — Z3A28 28 weeks gestation of pregnancy: Secondary | ICD-10-CM | POA: Diagnosis not present

## 2017-01-10 DIAGNOSIS — Z369 Encounter for antenatal screening, unspecified: Secondary | ICD-10-CM | POA: Diagnosis not present

## 2017-01-16 DIAGNOSIS — E669 Obesity, unspecified: Secondary | ICD-10-CM | POA: Diagnosis not present

## 2017-01-16 DIAGNOSIS — D509 Iron deficiency anemia, unspecified: Secondary | ICD-10-CM | POA: Diagnosis not present

## 2017-01-16 DIAGNOSIS — Z3A29 29 weeks gestation of pregnancy: Secondary | ICD-10-CM | POA: Diagnosis not present

## 2017-01-16 DIAGNOSIS — O09219 Supervision of pregnancy with history of pre-term labor, unspecified trimester: Secondary | ICD-10-CM | POA: Diagnosis not present

## 2017-01-17 DIAGNOSIS — F331 Major depressive disorder, recurrent, moderate: Secondary | ICD-10-CM | POA: Diagnosis not present

## 2017-01-17 DIAGNOSIS — O09219 Supervision of pregnancy with history of pre-term labor, unspecified trimester: Secondary | ICD-10-CM | POA: Diagnosis not present

## 2017-01-19 DIAGNOSIS — F331 Major depressive disorder, recurrent, moderate: Secondary | ICD-10-CM | POA: Diagnosis not present

## 2017-01-24 DIAGNOSIS — F331 Major depressive disorder, recurrent, moderate: Secondary | ICD-10-CM | POA: Diagnosis not present

## 2017-01-25 DIAGNOSIS — O09219 Supervision of pregnancy with history of pre-term labor, unspecified trimester: Secondary | ICD-10-CM | POA: Diagnosis not present

## 2017-01-31 DIAGNOSIS — O09213 Supervision of pregnancy with history of pre-term labor, third trimester: Secondary | ICD-10-CM | POA: Diagnosis not present

## 2017-01-31 DIAGNOSIS — Z3A31 31 weeks gestation of pregnancy: Secondary | ICD-10-CM | POA: Diagnosis not present

## 2017-01-31 DIAGNOSIS — Z23 Encounter for immunization: Secondary | ICD-10-CM | POA: Diagnosis not present

## 2017-01-31 DIAGNOSIS — O909 Complication of the puerperium, unspecified: Secondary | ICD-10-CM | POA: Diagnosis not present

## 2017-02-02 ENCOUNTER — Other Ambulatory Visit: Payer: Self-pay | Admitting: Obstetrics and Gynecology

## 2017-02-02 DIAGNOSIS — F331 Major depressive disorder, recurrent, moderate: Secondary | ICD-10-CM | POA: Diagnosis not present

## 2017-02-06 DIAGNOSIS — E039 Hypothyroidism, unspecified: Secondary | ICD-10-CM | POA: Diagnosis not present

## 2017-02-06 DIAGNOSIS — E1165 Type 2 diabetes mellitus with hyperglycemia: Secondary | ICD-10-CM | POA: Diagnosis not present

## 2017-02-06 DIAGNOSIS — E119 Type 2 diabetes mellitus without complications: Secondary | ICD-10-CM | POA: Diagnosis not present

## 2017-02-06 DIAGNOSIS — D649 Anemia, unspecified: Secondary | ICD-10-CM | POA: Diagnosis not present

## 2017-02-08 DIAGNOSIS — O24119 Pre-existing diabetes mellitus, type 2, in pregnancy, unspecified trimester: Secondary | ICD-10-CM | POA: Diagnosis not present

## 2017-02-08 DIAGNOSIS — Z3A32 32 weeks gestation of pregnancy: Secondary | ICD-10-CM | POA: Diagnosis not present

## 2017-02-08 DIAGNOSIS — O09219 Supervision of pregnancy with history of pre-term labor, unspecified trimester: Secondary | ICD-10-CM | POA: Diagnosis not present

## 2017-02-09 DIAGNOSIS — F331 Major depressive disorder, recurrent, moderate: Secondary | ICD-10-CM | POA: Diagnosis not present

## 2017-02-15 DIAGNOSIS — Z3A33 33 weeks gestation of pregnancy: Secondary | ICD-10-CM | POA: Diagnosis not present

## 2017-02-15 DIAGNOSIS — O09219 Supervision of pregnancy with history of pre-term labor, unspecified trimester: Secondary | ICD-10-CM | POA: Diagnosis not present

## 2017-02-15 DIAGNOSIS — O99213 Obesity complicating pregnancy, third trimester: Secondary | ICD-10-CM | POA: Diagnosis not present

## 2017-02-15 DIAGNOSIS — Z3403 Encounter for supervision of normal first pregnancy, third trimester: Secondary | ICD-10-CM | POA: Diagnosis not present

## 2017-02-15 DIAGNOSIS — O909 Complication of the puerperium, unspecified: Secondary | ICD-10-CM | POA: Diagnosis not present

## 2017-02-15 DIAGNOSIS — N898 Other specified noninflammatory disorders of vagina: Secondary | ICD-10-CM | POA: Diagnosis not present

## 2017-02-16 DIAGNOSIS — F331 Major depressive disorder, recurrent, moderate: Secondary | ICD-10-CM | POA: Diagnosis not present

## 2017-02-22 DIAGNOSIS — R102 Pelvic and perineal pain: Secondary | ICD-10-CM | POA: Diagnosis not present

## 2017-02-22 DIAGNOSIS — O09219 Supervision of pregnancy with history of pre-term labor, unspecified trimester: Secondary | ICD-10-CM | POA: Diagnosis not present

## 2017-02-22 DIAGNOSIS — O24119 Pre-existing diabetes mellitus, type 2, in pregnancy, unspecified trimester: Secondary | ICD-10-CM | POA: Diagnosis not present

## 2017-02-22 DIAGNOSIS — Z3A34 34 weeks gestation of pregnancy: Secondary | ICD-10-CM | POA: Diagnosis not present

## 2017-02-23 DIAGNOSIS — F331 Major depressive disorder, recurrent, moderate: Secondary | ICD-10-CM | POA: Diagnosis not present

## 2017-03-01 DIAGNOSIS — F331 Major depressive disorder, recurrent, moderate: Secondary | ICD-10-CM | POA: Diagnosis not present

## 2017-03-01 DIAGNOSIS — O09219 Supervision of pregnancy with history of pre-term labor, unspecified trimester: Secondary | ICD-10-CM | POA: Diagnosis not present

## 2017-03-01 DIAGNOSIS — O99019 Anemia complicating pregnancy, unspecified trimester: Secondary | ICD-10-CM | POA: Diagnosis not present

## 2017-03-01 DIAGNOSIS — O24119 Pre-existing diabetes mellitus, type 2, in pregnancy, unspecified trimester: Secondary | ICD-10-CM | POA: Diagnosis not present

## 2017-03-01 DIAGNOSIS — Z3A35 35 weeks gestation of pregnancy: Secondary | ICD-10-CM | POA: Diagnosis not present

## 2017-03-02 DIAGNOSIS — F331 Major depressive disorder, recurrent, moderate: Secondary | ICD-10-CM | POA: Diagnosis not present

## 2017-03-06 ENCOUNTER — Telehealth (HOSPITAL_COMMUNITY): Payer: Self-pay | Admitting: *Deleted

## 2017-03-06 NOTE — Telephone Encounter (Signed)
Preadmission screen  

## 2017-03-07 ENCOUNTER — Encounter (HOSPITAL_COMMUNITY): Payer: Self-pay

## 2017-03-08 DIAGNOSIS — R300 Dysuria: Secondary | ICD-10-CM | POA: Diagnosis not present

## 2017-03-08 DIAGNOSIS — Z369 Encounter for antenatal screening, unspecified: Secondary | ICD-10-CM | POA: Diagnosis not present

## 2017-03-08 DIAGNOSIS — Z3A36 36 weeks gestation of pregnancy: Secondary | ICD-10-CM | POA: Diagnosis not present

## 2017-03-08 DIAGNOSIS — O99213 Obesity complicating pregnancy, third trimester: Secondary | ICD-10-CM | POA: Diagnosis not present

## 2017-03-09 DIAGNOSIS — F331 Major depressive disorder, recurrent, moderate: Secondary | ICD-10-CM | POA: Diagnosis not present

## 2017-03-14 ENCOUNTER — Encounter (HOSPITAL_COMMUNITY)
Admission: RE | Admit: 2017-03-14 | Discharge: 2017-03-14 | Disposition: A | Discharge status: Home / Self Care | Payer: BLUE CROSS/BLUE SHIELD | Source: Ambulatory Visit | Attending: Obstetrics and Gynecology | Admitting: Obstetrics and Gynecology

## 2017-03-14 DIAGNOSIS — Z794 Long term (current) use of insulin: Secondary | ICD-10-CM | POA: Diagnosis not present

## 2017-03-14 DIAGNOSIS — Z3A37 37 weeks gestation of pregnancy: Secondary | ICD-10-CM | POA: Diagnosis not present

## 2017-03-14 DIAGNOSIS — O2412 Pre-existing diabetes mellitus, type 2, in childbirth: Secondary | ICD-10-CM | POA: Diagnosis not present

## 2017-03-14 DIAGNOSIS — Z23 Encounter for immunization: Secondary | ICD-10-CM | POA: Diagnosis not present

## 2017-03-14 DIAGNOSIS — O34212 Maternal care for vertical scar from previous cesarean delivery: Secondary | ICD-10-CM | POA: Diagnosis not present

## 2017-03-14 DIAGNOSIS — O34219 Maternal care for unspecified type scar from previous cesarean delivery: Secondary | ICD-10-CM | POA: Diagnosis not present

## 2017-03-14 DIAGNOSIS — O99214 Obesity complicating childbirth: Secondary | ICD-10-CM | POA: Diagnosis not present

## 2017-03-14 DIAGNOSIS — O99284 Endocrine, nutritional and metabolic diseases complicating childbirth: Secondary | ICD-10-CM | POA: Diagnosis not present

## 2017-03-14 DIAGNOSIS — K219 Gastro-esophageal reflux disease without esophagitis: Secondary | ICD-10-CM | POA: Diagnosis not present

## 2017-03-14 DIAGNOSIS — E039 Hypothyroidism, unspecified: Secondary | ICD-10-CM | POA: Diagnosis not present

## 2017-03-14 DIAGNOSIS — O24919 Unspecified diabetes mellitus in pregnancy, unspecified trimester: Secondary | ICD-10-CM | POA: Diagnosis not present

## 2017-03-14 DIAGNOSIS — Z3493 Encounter for supervision of normal pregnancy, unspecified, third trimester: Secondary | ICD-10-CM | POA: Diagnosis not present

## 2017-03-14 DIAGNOSIS — O9902 Anemia complicating childbirth: Secondary | ICD-10-CM | POA: Diagnosis not present

## 2017-03-14 DIAGNOSIS — O9962 Diseases of the digestive system complicating childbirth: Secondary | ICD-10-CM | POA: Diagnosis not present

## 2017-03-14 DIAGNOSIS — E119 Type 2 diabetes mellitus without complications: Secondary | ICD-10-CM | POA: Diagnosis not present

## 2017-03-14 DIAGNOSIS — D649 Anemia, unspecified: Secondary | ICD-10-CM | POA: Diagnosis not present

## 2017-03-14 DIAGNOSIS — O99824 Streptococcus B carrier state complicating childbirth: Secondary | ICD-10-CM | POA: Diagnosis not present

## 2017-03-14 DIAGNOSIS — O3433 Maternal care for cervical incompetence, third trimester: Secondary | ICD-10-CM | POA: Diagnosis not present

## 2017-03-14 DIAGNOSIS — O34211 Maternal care for low transverse scar from previous cesarean delivery: Secondary | ICD-10-CM | POA: Diagnosis not present

## 2017-03-14 LAB — TYPE AND SCREEN
ABO/RH(D): O POS
Antibody Screen: NEGATIVE

## 2017-03-14 LAB — CBC
HCT: 32.9 % — ABNORMAL LOW (ref 36.0–46.0)
HEMOGLOBIN: 10.6 g/dL — AB (ref 12.0–15.0)
MCH: 24.8 pg — ABNORMAL LOW (ref 26.0–34.0)
MCHC: 32.2 g/dL (ref 30.0–36.0)
MCV: 76.9 fL — ABNORMAL LOW (ref 78.0–100.0)
Platelets: 214 10*3/uL (ref 150–400)
RBC: 4.28 MIL/uL (ref 3.87–5.11)
RDW: 17.5 % — AB (ref 11.5–15.5)
WBC: 10.1 10*3/uL (ref 4.0–10.5)

## 2017-03-14 MED ORDER — FERRIC SUBSULFATE 259 MG/GM EX SOLN
CUTANEOUS | Status: AC
  Filled 2017-03-14: qty 8

## 2017-03-14 NOTE — Patient Instructions (Addendum)
Selene Styles  03/14/2017   Your procedure is scheduled on:  03/15/2017  Enter through the Main Entrance of [facility] at 1100 AM.  Pick up the phone at the desk and dial [phone]  Call this number if you have problems the morning of surgery:[phone]  Remember:   Do not eat food:After Midnight.  Do not drink clear liquids: After Midnight.  Take these medicines the morning of surgery with A SIP OF WATER: take your levothyroxine with a sip of water in the morning.  Take half of your lantus insulin evening dose.  DO not take any insulin in the morning. Take your normal humulog dose with dinner tonight.  Do not take your metformin tonight or tomorrow morning.   Do not wear jewelry, make-up or nail polish.  Do not wear lotions, powders, or perfumes. Do not wear deodorant.  Do not shave 48 hours prior to surgery.  Do not bring valuables to the hospital.  [facility] is not   responsible for any belongings or valuables brought to the hospital.  Contacts, dentures or bridgework may not be worn into surgery.  Leave suitcase in the car. After surgery it may be brought to your room.  For patients admitted to the hospital, checkout time is 11:00 AM the day of              discharge.    N/A   Please read over the following fact sheets that you were given:   Surgical Site Infection Prevention

## 2017-03-15 ENCOUNTER — Inpatient Hospital Stay (HOSPITAL_COMMUNITY)
Admission: RE | Admit: 2017-03-15 | Discharge: 2017-03-18 | DRG: 786 | Disposition: A | Discharge status: Home / Self Care | Payer: BLUE CROSS/BLUE SHIELD | Source: Ambulatory Visit | Attending: Obstetrics and Gynecology | Admitting: Obstetrics and Gynecology

## 2017-03-15 ENCOUNTER — Encounter (HOSPITAL_COMMUNITY): Payer: Self-pay

## 2017-03-15 ENCOUNTER — Inpatient Hospital Stay (HOSPITAL_COMMUNITY): Payer: BLUE CROSS/BLUE SHIELD | Admitting: Anesthesiology

## 2017-03-15 ENCOUNTER — Encounter (HOSPITAL_COMMUNITY)
Admission: RE | Disposition: A | Discharge status: Home / Self Care | Payer: Self-pay | Source: Ambulatory Visit | Attending: Obstetrics and Gynecology

## 2017-03-15 DIAGNOSIS — E119 Type 2 diabetes mellitus without complications: Secondary | ICD-10-CM | POA: Diagnosis present

## 2017-03-15 DIAGNOSIS — O34211 Maternal care for low transverse scar from previous cesarean delivery: Secondary | ICD-10-CM | POA: Diagnosis present

## 2017-03-15 DIAGNOSIS — O34212 Maternal care for vertical scar from previous cesarean delivery: Secondary | ICD-10-CM | POA: Diagnosis not present

## 2017-03-15 DIAGNOSIS — E039 Hypothyroidism, unspecified: Secondary | ICD-10-CM | POA: Diagnosis present

## 2017-03-15 DIAGNOSIS — K219 Gastro-esophageal reflux disease without esophagitis: Secondary | ICD-10-CM | POA: Diagnosis present

## 2017-03-15 DIAGNOSIS — O99284 Endocrine, nutritional and metabolic diseases complicating childbirth: Secondary | ICD-10-CM | POA: Diagnosis present

## 2017-03-15 DIAGNOSIS — O99214 Obesity complicating childbirth: Secondary | ICD-10-CM | POA: Diagnosis present

## 2017-03-15 DIAGNOSIS — Z794 Long term (current) use of insulin: Secondary | ICD-10-CM | POA: Diagnosis not present

## 2017-03-15 DIAGNOSIS — O9902 Anemia complicating childbirth: Secondary | ICD-10-CM | POA: Diagnosis present

## 2017-03-15 DIAGNOSIS — D649 Anemia, unspecified: Secondary | ICD-10-CM | POA: Diagnosis present

## 2017-03-15 DIAGNOSIS — O3433 Maternal care for cervical incompetence, third trimester: Secondary | ICD-10-CM | POA: Diagnosis present

## 2017-03-15 DIAGNOSIS — Z3A37 37 weeks gestation of pregnancy: Secondary | ICD-10-CM

## 2017-03-15 DIAGNOSIS — O99824 Streptococcus B carrier state complicating childbirth: Secondary | ICD-10-CM | POA: Diagnosis present

## 2017-03-15 DIAGNOSIS — O34219 Maternal care for unspecified type scar from previous cesarean delivery: Secondary | ICD-10-CM | POA: Diagnosis not present

## 2017-03-15 DIAGNOSIS — O9962 Diseases of the digestive system complicating childbirth: Secondary | ICD-10-CM | POA: Diagnosis present

## 2017-03-15 DIAGNOSIS — O2412 Pre-existing diabetes mellitus, type 2, in childbirth: Secondary | ICD-10-CM | POA: Diagnosis present

## 2017-03-15 DIAGNOSIS — Z98891 History of uterine scar from previous surgery: Secondary | ICD-10-CM

## 2017-03-15 LAB — GLUCOSE, CAPILLARY
GLUCOSE-CAPILLARY: 83 mg/dL (ref 65–99)
Glucose-Capillary: 85 mg/dL (ref 65–99)

## 2017-03-15 LAB — RPR: RPR Ser Ql: NONREACTIVE

## 2017-03-15 SURGERY — Surgical Case
Anesthesia: Spinal

## 2017-03-15 MED ORDER — MORPHINE SULFATE (PF) 0.5 MG/ML IJ SOLN
INTRAMUSCULAR | Status: DC | PRN
  Administered 2017-03-15: .2 mg via INTRATHECAL

## 2017-03-15 MED ORDER — KETOROLAC TROMETHAMINE 30 MG/ML IJ SOLN
INTRAMUSCULAR | Status: AC
  Administered 2017-03-15: 30 mg
  Filled 2017-03-15: qty 1

## 2017-03-15 MED ORDER — SIMETHICONE 80 MG PO CHEW
80.0000 mg | CHEWABLE_TABLET | ORAL | Status: DC | PRN
  Filled 2017-03-15: qty 1

## 2017-03-15 MED ORDER — LACTATED RINGERS IV SOLN: INTRAVENOUS | Status: DC

## 2017-03-15 MED ORDER — OXYCODONE HCL 5 MG PO TABS
5.0000 mg | ORAL_TABLET | ORAL | Status: DC | PRN
  Administered 2017-03-17 – 2017-03-18 (×4): 5 mg via ORAL
  Filled 2017-03-15 (×4): qty 1

## 2017-03-15 MED ORDER — LACTATED RINGERS IV SOLN
INTRAVENOUS | Status: DC
  Administered 2017-03-15 (×3): via INTRAVENOUS

## 2017-03-15 MED ORDER — OXYTOCIN 40 UNITS IN LACTATED RINGERS INFUSION - SIMPLE MED: 2.5000 [IU]/h | INTRAVENOUS | Status: AC

## 2017-03-15 MED ORDER — ONDANSETRON HCL 4 MG/2ML IJ SOLN
4.0000 mg | INTRAMUSCULAR | Status: DC | PRN
  Administered 2017-03-15: 4 mg via INTRAVENOUS
  Filled 2017-03-15: qty 2

## 2017-03-15 MED ORDER — ZOLPIDEM TARTRATE 5 MG PO TABS: 5.0000 mg | ORAL_TABLET | Freq: Every evening | ORAL | Status: DC | PRN

## 2017-03-15 MED ORDER — SCOPOLAMINE 1 MG/3DAYS TD PT72
1.0000 | MEDICATED_PATCH | Freq: Once | TRANSDERMAL | Status: DC
  Administered 2017-03-15: 1.5 mg via TRANSDERMAL
  Filled 2017-03-15: qty 1

## 2017-03-15 MED ORDER — WITCH HAZEL-GLYCERIN EX PADS: 1.0000 "application " | MEDICATED_PAD | CUTANEOUS | Status: DC | PRN

## 2017-03-15 MED ORDER — SIMETHICONE 80 MG PO CHEW
80.0000 mg | CHEWABLE_TABLET | ORAL | Status: DC
  Administered 2017-03-15 – 2017-03-17 (×3): 80 mg via ORAL
  Filled 2017-03-15 (×3): qty 1

## 2017-03-15 MED ORDER — MEDROXYPROGESTERONE ACETATE 150 MG/ML IM SUSP: 150.0000 mg | INTRAMUSCULAR | Status: DC | PRN

## 2017-03-15 MED ORDER — SENNOSIDES-DOCUSATE SODIUM 8.6-50 MG PO TABS
2.0000 | ORAL_TABLET | ORAL | Status: DC
  Administered 2017-03-15 – 2017-03-17 (×3): 2 via ORAL
  Filled 2017-03-15 (×3): qty 2

## 2017-03-15 MED ORDER — MENTHOL 3 MG MT LOZG: 1.0000 | LOZENGE | OROMUCOSAL | Status: DC | PRN

## 2017-03-15 MED ORDER — PRENATAL MULTIVITAMIN CH
1.0000 | ORAL_TABLET | Freq: Every day | ORAL | Status: DC
  Administered 2017-03-16 – 2017-03-18 (×3): 1 via ORAL
  Filled 2017-03-15 (×3): qty 1

## 2017-03-15 MED ORDER — SOD CITRATE-CITRIC ACID 500-334 MG/5ML PO SOLN
30.0000 mL | Freq: Once | ORAL | Status: AC
  Administered 2017-03-15: 30 mL via ORAL
  Filled 2017-03-15: qty 15

## 2017-03-15 MED ORDER — FENTANYL CITRATE (PF) 100 MCG/2ML IJ SOLN
INTRAMUSCULAR | Status: AC
  Filled 2017-03-15: qty 2

## 2017-03-15 MED ORDER — TETANUS-DIPHTH-ACELL PERTUSSIS 5-2.5-18.5 LF-MCG/0.5 IM SUSP: 0.5000 mL | Freq: Once | INTRAMUSCULAR | Status: DC

## 2017-03-15 MED ORDER — ACETAMINOPHEN 325 MG PO TABS
650.0000 mg | ORAL_TABLET | ORAL | Status: DC | PRN
  Administered 2017-03-16: 650 mg via ORAL
  Filled 2017-03-15 (×2): qty 2

## 2017-03-15 MED ORDER — HYDROMORPHONE HCL 1 MG/ML IJ SOLN: 0.2500 mg | INTRAMUSCULAR | Status: DC | PRN

## 2017-03-15 MED ORDER — DIPHENHYDRAMINE HCL 25 MG PO CAPS: 25.0000 mg | ORAL_CAPSULE | Freq: Four times a day (QID) | ORAL | Status: DC | PRN

## 2017-03-15 MED ORDER — COCONUT OIL OIL: 1.0000 "application " | TOPICAL_OIL | Status: DC | PRN

## 2017-03-15 MED ORDER — BUPIVACAINE IN DEXTROSE 0.75-8.25 % IT SOLN
INTRATHECAL | Status: AC
  Filled 2017-03-15: qty 2

## 2017-03-15 MED ORDER — MORPHINE SULFATE (PF) 0.5 MG/ML IJ SOLN
INTRAMUSCULAR | Status: AC
  Filled 2017-03-15: qty 10

## 2017-03-15 MED ORDER — LACTATED RINGERS IV SOLN
INTRAVENOUS | Status: DC | PRN
  Administered 2017-03-15: 14:00:00 via INTRAVENOUS

## 2017-03-15 MED ORDER — PHENYLEPHRINE HCL 10 MG/ML IJ SOLN
INTRAMUSCULAR | Status: DC | PRN
  Administered 2017-03-15: 80 ug via INTRAVENOUS

## 2017-03-15 MED ORDER — FAMOTIDINE 20 MG PO TABS
20.0000 mg | ORAL_TABLET | Freq: Once | ORAL | Status: AC
  Administered 2017-03-15: 20 mg via ORAL
  Filled 2017-03-15: qty 1

## 2017-03-15 MED ORDER — OXYCODONE HCL 5 MG PO TABS: 5.0000 mg | ORAL_TABLET | Freq: Once | ORAL | Status: DC | PRN

## 2017-03-15 MED ORDER — ONDANSETRON HCL 4 MG/2ML IJ SOLN
INTRAMUSCULAR | Status: DC | PRN
Start: 2017-03-15 — End: 2017-03-15
  Administered 2017-03-15: 4 mg via INTRAVENOUS

## 2017-03-15 MED ORDER — PROMETHAZINE HCL 25 MG/ML IJ SOLN: 6.2500 mg | INTRAMUSCULAR | Status: DC | PRN

## 2017-03-15 MED ORDER — CEFAZOLIN SODIUM-DEXTROSE 2-4 GM/100ML-% IV SOLN
2.0000 g | INTRAVENOUS | Status: AC
  Administered 2017-03-15: 2 g via INTRAVENOUS

## 2017-03-15 MED ORDER — LEVOTHYROXINE SODIUM 100 MCG PO TABS
100.0000 ug | ORAL_TABLET | Freq: Every day | ORAL | Status: DC
  Administered 2017-03-16 – 2017-03-18 (×3): 100 ug via ORAL
  Filled 2017-03-15 (×5): qty 1

## 2017-03-15 MED ORDER — ALBUTEROL SULFATE (2.5 MG/3ML) 0.083% IN NEBU: 3.0000 mL | INHALATION_SOLUTION | Freq: Four times a day (QID) | RESPIRATORY_TRACT | Status: DC | PRN

## 2017-03-15 MED ORDER — INSULIN GLARGINE 100 UNIT/ML ~~LOC~~ SOLN
15.0000 [IU] | Freq: Every day | SUBCUTANEOUS | Status: DC
  Administered 2017-03-16 – 2017-03-17 (×2): 15 [IU] via SUBCUTANEOUS
  Filled 2017-03-15 (×4): qty 0.15

## 2017-03-15 MED ORDER — FENTANYL CITRATE (PF) 100 MCG/2ML IJ SOLN
INTRAMUSCULAR | Status: DC | PRN
  Administered 2017-03-15: 10 ug via INTRATHECAL

## 2017-03-15 MED ORDER — KETOROLAC TROMETHAMINE 30 MG/ML IJ SOLN: 30.0000 mg | Freq: Once | INTRAMUSCULAR | Status: DC | PRN

## 2017-03-15 MED ORDER — DIBUCAINE 1 % RE OINT: 1.0000 "application " | TOPICAL_OINTMENT | RECTAL | Status: DC | PRN

## 2017-03-15 MED ORDER — SIMETHICONE 80 MG PO CHEW
80.0000 mg | CHEWABLE_TABLET | Freq: Three times a day (TID) | ORAL | Status: DC
  Administered 2017-03-16 – 2017-03-18 (×6): 80 mg via ORAL
  Filled 2017-03-15 (×6): qty 1

## 2017-03-15 MED ORDER — IBUPROFEN 600 MG PO TABS
600.0000 mg | ORAL_TABLET | Freq: Four times a day (QID) | ORAL | Status: DC
  Administered 2017-03-15 – 2017-03-18 (×11): 600 mg via ORAL
  Filled 2017-03-15 (×11): qty 1

## 2017-03-15 MED ORDER — OXYTOCIN 10 UNIT/ML IJ SOLN
INTRAVENOUS | Status: DC | PRN
  Administered 2017-03-15: 40 [IU] via INTRAVENOUS

## 2017-03-15 MED ORDER — ONDANSETRON HCL 4 MG/2ML IJ SOLN
INTRAMUSCULAR | Status: AC
  Filled 2017-03-15: qty 2

## 2017-03-15 MED ORDER — OXYCODONE HCL 5 MG/5ML PO SOLN: 5.0000 mg | Freq: Once | ORAL | Status: DC | PRN

## 2017-03-15 MED ORDER — OXYTOCIN 10 UNIT/ML IJ SOLN
INTRAMUSCULAR | Status: AC
  Filled 2017-03-15: qty 4

## 2017-03-15 MED ORDER — OXYCODONE HCL 5 MG PO TABS: 10.0000 mg | ORAL_TABLET | ORAL | Status: DC | PRN

## 2017-03-15 MED ORDER — PHENYLEPHRINE 40 MCG/ML (10ML) SYRINGE FOR IV PUSH (FOR BLOOD PRESSURE SUPPORT)
PREFILLED_SYRINGE | INTRAVENOUS | Status: AC
  Filled 2017-03-15: qty 10

## 2017-03-15 MED ORDER — BUPIVACAINE IN DEXTROSE 0.75-8.25 % IT SOLN
INTRATHECAL | Status: DC | PRN
  Administered 2017-03-15: 1.5 mL via INTRATHECAL

## 2017-03-15 MED ORDER — PHENYLEPHRINE 8 MG IN D5W 100 ML (0.08MG/ML) PREMIX OPTIME
INJECTION | INTRAVENOUS | Status: DC | PRN
  Administered 2017-03-15: 40 ug/min via INTRAVENOUS

## 2017-03-15 MED ORDER — PHENYLEPHRINE 8 MG IN D5W 100 ML (0.08MG/ML) PREMIX OPTIME
INJECTION | INTRAVENOUS | Status: AC
  Filled 2017-03-15: qty 100

## 2017-03-15 MED ORDER — SODIUM CHLORIDE 0.9 % IR SOLN
Status: DC | PRN
  Administered 2017-03-15: 1

## 2017-03-15 SURGICAL SUPPLY — 36 items
APL SKNCLS STERI-STRIP NONHPOA (GAUZE/BANDAGES/DRESSINGS) ×1
BENZOIN TINCTURE PRP APPL 2/3 (GAUZE/BANDAGES/DRESSINGS) ×3 IMPLANT
CHLORAPREP W/TINT 26ML (MISCELLANEOUS) ×3 IMPLANT
CLAMP CORD UMBIL (MISCELLANEOUS) IMPLANT
CLOSURE WOUND 1/2 X4 (GAUZE/BANDAGES/DRESSINGS) ×1
CLOTH BEACON ORANGE TIMEOUT ST (SAFETY) ×3 IMPLANT
CONTAINER PREFILL 10% NBF 15ML (MISCELLANEOUS) IMPLANT
DRSG OPSITE POSTOP 4X10 (GAUZE/BANDAGES/DRESSINGS) ×3 IMPLANT
ELECT REM PT RETURN 9FT ADLT (ELECTROSURGICAL) ×3
ELECTRODE REM PT RTRN 9FT ADLT (ELECTROSURGICAL) ×1 IMPLANT
EXTRACTOR VACUUM M CUP 4 TUBE (SUCTIONS) IMPLANT
EXTRACTOR VACUUM M CUP 4' TUBE (SUCTIONS)
GLOVE BIO SURGEON STRL SZ7.5 (GLOVE) ×3 IMPLANT
GLOVE BIOGEL PI IND STRL 7.0 (GLOVE) ×1 IMPLANT
GLOVE BIOGEL PI IND STRL 7.5 (GLOVE) ×1 IMPLANT
GLOVE BIOGEL PI INDICATOR 7.0 (GLOVE) ×2
GLOVE BIOGEL PI INDICATOR 7.5 (GLOVE) ×2
GOWN STRL REUS W/TWL LRG LVL3 (GOWN DISPOSABLE) ×6 IMPLANT
KIT ABG SYR 3ML LUER SLIP (SYRINGE) IMPLANT
NEEDLE HYPO 25X5/8 SAFETYGLIDE (NEEDLE) IMPLANT
NS IRRIG 1000ML POUR BTL (IV SOLUTION) ×3 IMPLANT
PACK C SECTION WH (CUSTOM PROCEDURE TRAY) ×3 IMPLANT
PAD OB MATERNITY 4.3X12.25 (PERSONAL CARE ITEMS) ×3 IMPLANT
PENCIL SMOKE EVAC W/HOLSTER (ELECTROSURGICAL) ×3 IMPLANT
RTRCTR C-SECT PINK 25CM LRG (MISCELLANEOUS) ×3 IMPLANT
STRIP CLOSURE SKIN 1/2X4 (GAUZE/BANDAGES/DRESSINGS) ×2 IMPLANT
SUT CHROMIC 2 0 CT 1 (SUTURE) ×3 IMPLANT
SUT MNCRL AB 3-0 PS2 27 (SUTURE) ×3 IMPLANT
SUT PLAIN 2 0 XLH (SUTURE) ×3 IMPLANT
SUT VIC AB 0 CT1 36 (SUTURE) ×9 IMPLANT
SUT VIC AB 0 CTX 36 (SUTURE) ×9
SUT VIC AB 0 CTX36XBRD ANBCTRL (SUTURE) ×3 IMPLANT
SUT VIC AB 2-0 SH 27 (SUTURE) ×6
SUT VIC AB 2-0 SH 27XBRD (SUTURE) ×2 IMPLANT
TOWEL OR 17X24 6PK STRL BLUE (TOWEL DISPOSABLE) ×3 IMPLANT
TRAY FOLEY BAG SILVER LF 14FR (SET/KITS/TRAYS/PACK) ×3 IMPLANT

## 2017-03-15 NOTE — Op Note (Signed)
Cesarean Section Procedure Note  Indications: P1 at 1537 2/7wks admitted for repeat c/s and removal of cerclage  Pre-operative Diagnosis: 1.h/o prior classical cesarean section (needs repeat 36-37 weeks per MFM) 2.incompetent cervix   Post-operative Diagnosis: 1.h/o prior classical cesarean section (needs repeat 36-37 weeks per MFM) 2.incompetent cervix  Procedure: 1.REPEAT CESAREAN SECTION 2.REMOVAL OF CERCLAGE  Surgeon: Osborn Cohooberts, Shadoe Bethel, MD    Assistants: Webb SilversmithHeather Krietemeyer, RNFA  Anesthesia: Regional  Procedure Details  The patient was taken to the operating room secondary to h/o prior c/s and incompetent cervix after the risks, benefits, complications, treatment options, and expected outcomes were discussed with the patient.  The patient concurred with the proposed plan, giving informed consent which was signed and witnessed. The patient was taken to Operating Room one, identified as Kathryn Howard and the procedure verified as C-Section Delivery. A Time Out was held and the above information confirmed.  After induction of anesthesia by obtaining a spinal, the patient was prepped and draped in the usual sterile manner. A Pfannenstiel skin incision was made and carried down through the subcutaneous tissue to the underlying layer of fascia.  The fascia was incised bilaterally and extended transversely bilaterally with the Mayo scissors. Kocher clamps were placed on the inferior aspect of the fascial incision and the underlying rectus muscle was separated from the fascia. The same was done on the superior aspect of the fascial incision.  The peritoneum was identified, entered bluntly and extended manually.  An Alexis self-retaining retractor was placed.  The utero-vesical peritoneal reflection was incised transversely and the bladder flap was bluntly freed from the lower uterine segment. A low transverse uterine incision was made with the scalpel and extended bilaterally with the bandage scissors.  The  infant was delivered in vertex position without difficulty.  After the umbilical cord was clamped and cut, the infant was handed to the awaiting pediatricians.  Cord blood was obtained for evaluation.  The placenta was removed intact and appeared to be within normal limits. The uterus was cleared of all clots and debris. The uterine incision was closed with running interlocking sutures of 0 Vicryl and a second imbricating layer was performed as well.   Bilateral tubes and ovaries appeared to be within normal limits.  Good hemostasis was noted.  Copious irrigation was performed until clear.  An omental adhesion was transected after placeing 2 kelly clamps and incising with the bovie.  The pedicles were ligated with 2-0 vicryl.  The peritoneum was repaired with 2-0 chromic via a running suture.  The fascia was reapproximated with a running suture of 0 Vicryl. The skin was reapproximated with a subcuticular suture of 3-0 monocryl.  Steristrips were applied.  Patient was then frog legged and a graves speculum placed in vagina.  The cerclage was identified and removed without difficulty.  Speculum was removed.  Instrument, sponge, and needle counts were correct prior to abdominal closure and at the conclusion of the case.  The patient was awaiting transfer to the recovery room in good condition.  Findings: Live female infant with Apgars 9 at one minute and 9 at five minutes.  Normal appearing bilateral ovaries and fallopian tubes were noted.  Estimated Blood Loss:  500ml         Drains: foley to gravity 200 cc         Total IV Fluids: 2200 ml         Specimens to Pathology: Placenta         Complications:  None;  patient tolerated the procedure well.         Disposition: PACU - hemodynamically stable.         Condition: stable  Attending Attestation: I performed the procedure.

## 2017-03-15 NOTE — Anesthesia Postprocedure Evaluation (Signed)
Anesthesia Post Note  Patient: Kathryn Howard  Procedure(s) Performed: CESAREAN SECTION (N/A )     Patient location during evaluation: Mother Baby Anesthesia Type: Spinal Level of consciousness: awake, awake and alert, oriented and patient cooperative Pain management: pain level controlled Vital Signs Assessment: post-procedure vital signs reviewed and stable Respiratory status: spontaneous breathing, nonlabored ventilation and respiratory function stable Cardiovascular status: stable Postop Assessment: no headache, no backache, patient able to bend at knees and no apparent nausea or vomiting Anesthetic complications: no    Last Vitals:  Vitals:   03/15/17 1822 03/15/17 1920  BP: 101/75 (!) 102/57  Pulse: 85 67  Resp: 18 16  Temp: 36.8 C (!) 36.4 C  SpO2: 98% 94%    Last Pain:  Vitals:   03/15/17 1920  TempSrc: Oral  PainSc:    Pain Goal: Patients Stated Pain Goal: 6 (03/15/17 1147)               [name] L

## 2017-03-15 NOTE — H&P (Signed)
Kathryn Howard is a 41 y.o. female presenting for repeat c-section.  She denies LOF, VB, CTXs and reports good FM.  OB History    Gravida Para Term Preterm AB Living   4 2 1 1 1 1    SAB TAB Ectopic Multiple Live Births   1 0 0 0 2     Past Medical History:  Diagnosis Date  . Anemia   . Anxiety    no meds  . Asthma    seasonal per patient: hasnt used in months per patient  . Depression    stopped meds 2012  . Diabetes mellitus    type 2  . DUB (dysfunctional uterine bleeding)   . Fatty liver   . Gastroparesis   . GERD (gastroesophageal reflux disease)    with pregnancy  . H pylori ulcer   . H/O hematuria 03/14/11  . H/O rubella   . H/O seasonal allergies   . H/O varicella   . Headache    otc med prn  . History of anxiety   . History of blood transfusion 09/2011   [facility] - 2 units transfused  . History of ovarian cyst   . History of PCOS 03/07/10  . Hypothyroidism   . Low blood pressure   . Obesity   . Oligomenorrhea 09/12/10  . Pelvic pain 03/14/11   right sided back  . Vitamin D deficiency   . Yeast infection    Past Surgical History:  Procedure Laterality Date  . CERVICAL CERCLAGE  09/15/2011   Procedure: CERCLAGE CERVICAL;  Surgeon: Kirkland HunArthur Stringer, MD;  Location: [facility];  Service: Gynecology;  Laterality: N/A;  . CERVICAL CERCLAGE N/A 12/31/2013   Procedure: CERCLAGE CERVICAL;  Surgeon: Kirkland HunArthur Stringer, MD;  Location: [facility];  Service: Gynecology;  Laterality: N/A;  . CERVICAL CERCLAGE N/A 06/25/2014   Procedure: REMOVAL OF CERCLAGE CERVICAL;  Surgeon: Purcell NailsAngela Y Waylon Koffler, MD;  Location: [facility];  Service: Obstetrics;  Laterality: N/A;  . CERVICAL CERCLAGE N/A 09/27/2016   Procedure: CERCLAGE CERVICAL;  Surgeon: Osborn Cohooberts, Tabatha Razzano, MD;  Location: [facility];  Service: Gynecology;  Laterality: N/A;  . CESAREAN SECTION  10/18/2011   Procedure: CESAREAN SECTION;  Surgeon: Purcell NailsAngela Y Alpheus Stiff, MD;  Location: [facility];  Service: Gynecology;  Laterality: N/A;  . CESAREAN SECTION N/A 06/25/2014    Procedure: REPEAT CESAREAN SECTION;  Surgeon: Purcell NailsAngela Y Damond Borchers, MD;  Location: [facility];  Service: Obstetrics;  Laterality: N/A;  . DILATION AND CURETTAGE OF UTERUS  2008   [facility]  . UPPER GI ENDOSCOPY     Family History: family history includes Diabetes in her father and mother; Hyperlipidemia in her mother; Hypertension in her father and mother. Social History:  reports that  has never smoked. she has never used smokeless tobacco. She reports that she does not drink alcohol or use drugs.     Maternal Diabetes: Yes:  Diabetes Type:  Insulin/Medication controlled (insulin and metformin) Genetic Screening: Normal quad screen Maternal Ultrasounds/Referrals: Normal with ?CP cysts early on resolved on subsequent ultrasounds Fetal Ultrasounds or other Referrals:  Fetal echo wnl Maternal Substance Abuse:  No Significant Maternal Medications:  Meds include: Syntroid Other: insulin and metformin Significant Maternal Lab Results:  Lab values include: Group B Strep positive Other Comments:  None  ROS  Non-contributory History   Blood pressure 122/73, pulse 84, temperature 98.3 F (36.8 C), temperature source Oral, resp. rate 16, height 5\' 3"  (1.6 m), weight 113.9 kg (251 lb), last menstrual period 06/27/2016, SpO2 100 %,  unknown if currently breastfeeding. Exam Physical Exam  Lungs CTA CV RRR Abd gravid, NT Ext no calf tenderness  Prenatal labs: ABO, Rh: --/--/O POS (11/14 0935) Antibody: NEG (11/14 0935) Rubella: Immune (04/10 0000) RPR: Non Reactive (11/14 0935)  HBsAg: Negative (04/10 0000)  HIV: Non-reactive (04/10 0000)  GBS:   positive  Assessment/Plan: P1 at 37 2/7wks scheduled for repeat c-section and removal of cerclage.  R/B/A discussed.  Consent signed and witnessed.  Questions answered.  Purcell NailsROBERTS,Nahomy Limburg Y 03/15/2017, 12:02 PM

## 2017-03-15 NOTE — Anesthesia Procedure Notes (Signed)
Spinal  Patient location during procedure: OR Start time: 03/15/2017 1:24 PM End time: 03/15/2017 1:25 PM Staffing Anesthesiologist: Leonides GrillsEllender, Ryan P, MD Resident/CRNA: Cleda ClarksBrowder, Betzaida Cremeens R, CRNA Performed: other anesthesia staff  Preanesthetic Checklist Completed: patient identified, surgical consent, pre-op evaluation, timeout performed, IV checked, risks and benefits discussed and monitors and equipment checked Spinal Block Patient position: sitting Prep: DuraPrep Patient monitoring: heart rate, continuous pulse ox and blood pressure Location: L3-4 Injection technique: single-shot Needle Needle type: Pencan  Needle gauge: 24 G Needle length: 10 cm Assessment Sensory level: T4 Additional Notes Performed by B. Blake DivineLackey SRNA

## 2017-03-15 NOTE — Anesthesia Preprocedure Evaluation (Addendum)
Anesthesia Evaluation  Patient identified by MRN, date of birth, ID band Patient awake    Reviewed: Allergy & Precautions, H&P , NPO status , Patient's Chart, lab work & pertinent test results, reviewed documented beta blocker date and time   History of Anesthesia Complications Negative for: history of anesthetic complications  Airway Mallampati: III  TM Distance: >3 FB Neck ROM: full    Dental  (+) Teeth Intact   Pulmonary asthma ,    breath sounds clear to auscultation       Cardiovascular negative cardio ROS   Rhythm:regular Rate:Normal     Neuro/Psych  Headaches, PSYCHIATRIC DISORDERS (h/o anxiety/depression) Anxiety Depression    GI/Hepatic Neg liver ROS, PUD, GERD (with pregnancy)  Medicated and Controlled, Fatty liver  Gastroparesis   Endo/Other  diabetes, Type 2, Oral Hypoglycemic Agents, Insulin DependentHypothyroidism Morbid obesity  Renal/GU negative Renal ROS     Musculoskeletal   Abdominal (+) + obese,   Peds  Hematology  (+) anemia ,   Anesthesia Other Findings   Reproductive/Obstetrics (+) Pregnancy (18 week, incompetent cervix)                            Anesthesia Physical  Anesthesia Plan  ASA: III  Anesthesia Plan: Spinal   Post-op Pain Management:    Induction:   PONV Risk Score and Plan:   Airway Management Planned: Natural Airway  Additional Equipment:   Intra-op Plan:   Post-operative Plan:   Informed Consent: I have reviewed the patients History and Physical, chart, labs and discussed the procedure including the risks, benefits and alternatives for the proposed anesthesia with the patient or authorized representative who has indicated his/her understanding and acceptance.     Plan Discussed with: CRNA  Anesthesia Plan Comments:         Anesthesia Quick Evaluation

## 2017-03-15 NOTE — Transfer of Care (Signed)
Immediate Anesthesia Transfer of Care Note  Patient: Kathryn Howard  Procedure(s) Performed: CESAREAN SECTION (N/A )  Patient Location: PACU  Anesthesia Type:Spinal  Level of Consciousness: awake, alert  and oriented  Airway & Oxygen Therapy: Patient Spontanous Breathing  Post-op Assessment: Report given to RN and Post -op Vital signs reviewed and stable  Post vital signs: Reviewed and stable  Last Vitals:  Vitals:   03/15/17 1147  BP: 122/73  Pulse: 84  Resp: 16  Temp: 36.8 C  SpO2: 100%    Last Pain:  Vitals:   03/15/17 1147  TempSrc: Oral      Patients Stated Pain Goal: 6 (03/15/17 1147)  Complications: No apparent anesthesia complications and postural headache

## 2017-03-15 NOTE — Progress Notes (Signed)
Patient had x1 emesis. Zofran offered but refused. Patient told to go slow with sips of water and to order liquids as she can tolerate it. Patient is exhausted and wanted to rest. No paperwork done yet. Call bell explained to to patient with emergency bell.

## 2017-03-16 ENCOUNTER — Encounter (HOSPITAL_COMMUNITY): Payer: Self-pay | Admitting: Obstetrics and Gynecology

## 2017-03-16 ENCOUNTER — Other Ambulatory Visit: Payer: Self-pay

## 2017-03-16 LAB — CBC
HEMATOCRIT: 28.2 % — AB (ref 36.0–46.0)
Hemoglobin: 9.2 g/dL — ABNORMAL LOW (ref 12.0–15.0)
MCH: 25.5 pg — ABNORMAL LOW (ref 26.0–34.0)
MCHC: 32.6 g/dL (ref 30.0–36.0)
MCV: 78.1 fL (ref 78.0–100.0)
PLATELETS: 188 10*3/uL (ref 150–400)
RBC: 3.61 MIL/uL — AB (ref 3.87–5.11)
RDW: 16.8 % — AB (ref 11.5–15.5)
WBC: 7.6 10*3/uL (ref 4.0–10.5)

## 2017-03-16 LAB — GLUCOSE, CAPILLARY
GLUCOSE-CAPILLARY: 160 mg/dL — AB (ref 65–99)
GLUCOSE-CAPILLARY: 165 mg/dL — AB (ref 65–99)
GLUCOSE-CAPILLARY: 176 mg/dL — AB (ref 65–99)

## 2017-03-16 LAB — GLUCOSE, RANDOM: Glucose, Bld: 93 mg/dL (ref 65–99)

## 2017-03-16 MED ORDER — METFORMIN HCL 500 MG PO TABS
500.0000 mg | ORAL_TABLET | ORAL | Status: AC
  Administered 2017-03-16: 500 mg via ORAL
  Filled 2017-03-16: qty 1

## 2017-03-16 MED ORDER — FERROUS SULFATE 325 (65 FE) MG PO TABS
325.0000 mg | ORAL_TABLET | Freq: Every day | ORAL | Status: DC
  Administered 2017-03-16 – 2017-03-18 (×2): 325 mg via ORAL
  Filled 2017-03-16 (×2): qty 1

## 2017-03-16 MED ORDER — METFORMIN HCL 500 MG PO TABS
1000.0000 mg | ORAL_TABLET | Freq: Two times a day (BID) | ORAL | Status: DC
  Administered 2017-03-16 – 2017-03-18 (×3): 1000 mg via ORAL
  Filled 2017-03-16 (×6): qty 2

## 2017-03-16 NOTE — Lactation Note (Signed)
This note was copied from a baby's chart. Lactation Consultation Note Mom's 2nd baby. Mom BF her 1st child now 39 yrs old for 10 months. Mom had Mastitis during that time. Mom had difficulty latching d/t flat nipples and baby couldn't latch w/o NS. Mom pumped, BF w/NS and formula fed as well. Mom hoping this time will be better. Mom cont. To want to feed breast/formula. Discussed supply and demand, not draining breast, engorgement, mastitis causes, and milk supply. Mom stated her milk didn't come in until the 5th day. Mom does has dx; of PCOS. Baby also went to the NICU d/t hypoglycemia for 5 days, so stress also played a factor in milk supply coming in.  Mom has tubular breast space between breast. Nipples semi flat, compressible, stimulating everts nipple well. Hand expression demonstrated. LC didn't obtain colostrum at this time.  Baby is sleepy, not interested in BF. Assisted in football position after mom tried in cradle position. Discussed body alignment, STS, "C" hold. Support and comfort during BF.  Fitted mom w/#20 NS, snug, #24 a little loose. Mom stated she wore a medium, couldn't remember the number size. Gave shells to wear in bra today and hand pump to stimulate. DEBP not available at this time.  Reviewed newborn behavior, I&O, cluster feeding, and supplementing. Information on supplementing, LEAD, formula preparation w/moms understanding. Answered moms questions. Encouraged to call for assistance latching when baby is ready for BF. Encouraged to wake baby in 3 hours if hasn't cued. [facility] brochure given w/resources, support groups and LC services.  Patient Name: Kathryn Chipper [name] WUJWJ'XToday's Date: 03/16/2017 Reason for consult: Initial assessment;Infant < 6lbs;Early term 37-38.6wks   Maternal Data Has patient been taught Hand Expression?: Yes Does the patient have breastfeeding experience prior to this delivery?: Yes  Feeding    LATCH Score Latch: Too sleepy or reluctant, no latch  achieved, no sucking elicited.     Type of Nipple: Everted at rest and after stimulation(short shaft/semi flat)  Comfort (Breast/Nipple): Soft / non-tender        Interventions Interventions: Breast feeding basics reviewed;Assisted with latch;Breast compression;Shells;Skin to skin;Adjust position;Breast massage;Support pillows;Hand pump;Hand express;Position options;Pre-pump if needed  Lactation Tools Discussed/Used Pump Review: Setup, frequency, and cleaning;Milk Storage Initiated by:: Peri JeffersonL. Brisha Mccabe RN IBCLC Date initiated:: 03/16/17(in pm)   Consult Status Consult Status: Follow-up Date: 03/16/17(in pm) Follow-up type: In-patient    Shawndell Schillaci, Diamond NickelLAURA G 03/16/2017, 5:09 AM

## 2017-03-16 NOTE — Progress Notes (Signed)
Kathryn Howard [id number]  Subjective: Postpartum Day 1: Repeat C/S at term, hx prior classical C/S, removal of cerclage, Type 2 DM, on insulin Patient up ad lib, reports no syncope or dizziness. Feeding:  Breast Contraceptive plan:  Undecided  Had N/V yesterday, but able to eat crackers and drink fluids this am.  Objective: Temp:  [97.5 F (36.4 C)-98.3 F (36.8 C)] 98.3 F (36.8 C) (11/16 0349) Pulse Rate:  [59-93] 59 (11/15 2315) Resp:  [14-24] 16 (11/16 0349) BP: (90-122)/(50-94) 90/50 (11/15 2315) SpO2:  [92 %-100 %] 95 % (11/16 0349) Weight:  [113.9 kg (251 lb)] 113.9 kg (251 lb) (11/15 1147)  CBC Latest Ref Rng & Units 03/16/2017 03/14/2017 09/19/2016  WBC 4.0 - 10.5 K/uL 7.6 10.1 10.2  Hemoglobin 12.0 - 15.0 g/dL 7.8(G9.2(L) 10.6(L) 11.0(L)  Hematocrit 36.0 - 46.0 % 28.2(L) 32.9(L) 33.7(L)  Platelets 150 - 400 K/uL 188 214 234   CBG (last 3)  Recent Labs    03/15/17 1208 03/15/17 1516  GLUCAP 83 85   . ferrous sulfate  325 mg Oral Q breakfast  . ibuprofen  600 mg Oral Q6H  . insulin glargine  15 Units Subcutaneous Q supper  . levothyroxine  100 mcg Oral QAC breakfast  . prenatal multivitamin  1 tablet Oral Q1200  . senna-docusate  2 tablet Oral Q24H  . simethicone  80 mg Oral TID PC  . simethicone  80 mg Oral Q24H  . Tdap  0.5 mL Intramuscular Once    On Lantus 15 u with supper--held last night due to N/V.   Physical Exam:  General: alert Lochia: appropriate Uterine Fundus: firm Abdomen:  Faint bowel sounds Incision: Honeycomb dressing stained with old drainage DVT Evaluation: No evidence of DVT seen on physical exam. Negative Homan's sign.   Assessment/Plan: Status post cesarean delivery, day 1 Type 2 DM, on insulin. Hypothryoid--on Synthroid Reflux--on Famotidine Chronic anemia--stable. Stable Continue current care. Continue FBS and 2 hour PC Fe daily Undecided regarding contraception Recommend visit in 1-2 weeks for f/u high risk pp depression and  FBS (per MD recommendation.)     Nigel BridgemanVicki Vedant Shehadeh MSN, CNM 03/16/2017, 8:09 AM

## 2017-03-16 NOTE — Progress Notes (Signed)
Dr. Su Hiltoberts updated on Patient's status and CBG of 160 . Parameters to call if 180 or above in orders.

## 2017-03-16 NOTE — Progress Notes (Signed)
I received a referral from [name], LCSW, given pt's anxiety with this pregnancy and her history of a loss, her son, at 23-4 weeks.  I worked with Kathryn Howard at the time of her loss and the weeks prior when she was on antenatal.  She was grateful for the visit.  She appeared at ease and comfortable and she stated that she is doing much better now that her daughter is here.  She stated that after her first daughter was born, she had been very anxious and was very emotional following the delivery.  Although she was anxious during the pregnancy, she feels less emotional following this delivery, she feels that it is a blessing.  She has good support and her mother will be there during the post partum period to help her and support her.    Chaplain Dyanne CarrelKaty Ameena Vesey, Bcc Pager, [phone] 2:53 PM    03/16/17 1400  Clinical Encounter Type  Visited With Patient and family together  Visit Type Spiritual support  Referral From Social work  Spiritual Encounters  Spiritual Needs Emotional  Stress Factors  Family Stress Factors (Hx of 23 week loss in 2013)

## 2017-03-16 NOTE — Progress Notes (Signed)
MOB was referred for history of depression/anxiety. * Referral screened out by Clinical Social Worker because none of the following criteria appear to apply: ~ History of anxiety/depression during this pregnancy, or of post-partum depression. ~ Diagnosis of anxiety and/or depression within last 3 years OR * MOB's symptoms currently being treated with medication and/or therapy. Please contact the Clinical Social Worker if needs arise, by MOB request, or if MOB scores greater than 9/yes to question 10 on Edinburgh Postpartum Depression Screen.  PNR notes that MOB is in counseling at the [facility], however, given documentation of Anxiety during pregnancy, CSW asked that Chaplain/K. Claussen check in with MOB for support.  CSW available if Chaplain feels additional mental health support is needed.   

## 2017-03-17 LAB — GLUCOSE, CAPILLARY
GLUCOSE-CAPILLARY: 131 mg/dL — AB (ref 65–99)
GLUCOSE-CAPILLARY: 164 mg/dL — AB (ref 65–99)
Glucose-Capillary: 97 mg/dL (ref 65–99)
Glucose-Capillary: 141 mg/dL — ABNORMAL HIGH (ref 65–99)

## 2017-03-17 NOTE — Progress Notes (Signed)
Pt aware she has metformin and insulin ordered for 1700.  Aware she needs to call out when she orders dinner, aware meds will be passed at that time.

## 2017-03-17 NOTE — Progress Notes (Addendum)
Subjective: Postpartum Day 2: Cesarean Delivery repeat x 3 Patient reports + flatus, + BM and no problems voiding.  Ambulating.  Nursing infant.  Objective: Vital signs in last 24 hours: Temp:  [98 F (36.7 C)-98.4 F (36.9 C)] 98.3 F (36.8 C) (11/17 0538) Pulse Rate:  [77-86] 79 (11/17 0538) Resp:  [16-18] 17 (11/17 0538) BP: (94-104)/(47-57) 104/56 (11/17 0538) SpO2:  [98 %] 98 % (11/16 1257)  Physical Exam:  General: alert, cooperative and no distress Lochia: appropriate Uterine Fundus: firm Incision: healing well DVT Evaluation: No evidence of DVT seen on physical exam. Negative Homan's sign.  Recent Labs    03/14/17 0935 03/16/17 0636  HGB 10.6* 9.2*  HCT 32.9* 28.2*   BS Fasting 97, 2 hr pp [id number],165,176 Assessment/Plan: Status post Cesarean section. Doing well postoperatively.  Continue current care.  Plans on discharge tomorrow.  Will do mini pill for birth control.  Will monitor BS  Henderson Newcomerancy Jean Winda Summerall 03/17/2017, 6:56 AM

## 2017-03-18 LAB — GLUCOSE, CAPILLARY: Glucose-Capillary: 94 mg/dL (ref 65–99)

## 2017-03-18 MED ORDER — OXYCODONE HCL 5 MG PO TABS: 5.0000 mg | ORAL_TABLET | ORAL | 0 refills | Status: AC | PRN

## 2017-03-18 NOTE — Lactation Note (Signed)
This note was copied from a baby's chart. Lactation Consultation Note  Patient Name: Girl Chipper HerbHiba Marney WUJWJ'XToday's Date: 03/18/2017  Mom states baby got frustrated with latching so she has decided to formula feed.  She will continue to pump and give expressed milk if obtained.  Obtaining drops at times.  Offered latch assist prior to discharge but mom declined help.  Lactation outpatient services reviewed and encouraged prn.   Maternal Data    Feeding Feeding Type: Bottle Fed - Formula  LATCH Score                   Interventions    Lactation Tools Discussed/Used     Consult Status      Huston FoleyMOULDEN, Teran Knittle S 03/18/2017, 8:50 AM

## 2017-03-18 NOTE — Discharge Summary (Signed)
Obstetric Discharge Summary Reason for Admission: cesarean section Prenatal Procedures: NST and ultrasound Intrapartum Procedures: cesarean: low cervical, transverse Postpartum Procedures: none Complications-Operative and Postpartum: none Hemoglobin  Date Value Ref Range Status  03/16/2017 9.2 (L) 12.0 - 15.0 g/dL Final  13/08/657803/03/2012 46.911.1 g/dL    HCT  Date Value Ref Range Status  03/16/2017 28.2 (L) 36.0 - 46.0 % Final  07/10/2011 35 %     Physical Exam:  General: alert, cooperative and no distress Lochia: appropriate Uterine Fundus: firm Incision: healing well DVT Evaluation: No evidence of DVT seen on physical exam. Negative Homan's sign.  Discharge Diagnoses: Term Pregnancy-delivered  Discharge Information: Date: 03/18/2017 Activity: unrestricted Diet: routine Medications: PNV, Ibuprofen, Percocet and Metformin and lantus Condition: stable Instructions: refer to practice specific booklet Discharge to: home Follow-up Information    [facility] up in 6 week(s).   Specialty:  Obstetrics and Gynecology Contact information: [address] [place] [phone]          Newborn Data: Live born female  Birth Weight: 5 lb 15.1 oz (2695 g) APGAR: 9, 9  Newborn Delivery   Birth date/time:  03/15/2017 13:58:00 Delivery type:  C-Section, Low Vertical C-section categorization:  Repeat     Home with mother.  Kathryn Howard 03/18/2017, 7:07 AM

## 2017-03-26 DIAGNOSIS — R300 Dysuria: Secondary | ICD-10-CM | POA: Diagnosis not present

## 2017-04-12 DIAGNOSIS — F331 Major depressive disorder, recurrent, moderate: Secondary | ICD-10-CM | POA: Diagnosis not present

## 2017-04-26 DIAGNOSIS — F331 Major depressive disorder, recurrent, moderate: Secondary | ICD-10-CM | POA: Diagnosis not present

## 2017-04-26 DIAGNOSIS — E119 Type 2 diabetes mellitus without complications: Secondary | ICD-10-CM | POA: Diagnosis not present

## 2017-04-26 DIAGNOSIS — E039 Hypothyroidism, unspecified: Secondary | ICD-10-CM | POA: Diagnosis not present

## 2017-04-26 DIAGNOSIS — D649 Anemia, unspecified: Secondary | ICD-10-CM | POA: Diagnosis not present

## 2017-04-26 DIAGNOSIS — R5383 Other fatigue: Secondary | ICD-10-CM | POA: Diagnosis not present

## 2017-05-03 DIAGNOSIS — F331 Major depressive disorder, recurrent, moderate: Secondary | ICD-10-CM | POA: Diagnosis not present

## 2017-05-09 DIAGNOSIS — E119 Type 2 diabetes mellitus without complications: Secondary | ICD-10-CM | POA: Diagnosis not present

## 2017-05-09 DIAGNOSIS — Z09 Encounter for follow-up examination after completed treatment for conditions other than malignant neoplasm: Secondary | ICD-10-CM | POA: Diagnosis not present

## 2017-05-11 DIAGNOSIS — F331 Major depressive disorder, recurrent, moderate: Secondary | ICD-10-CM | POA: Diagnosis not present

## 2017-05-17 DIAGNOSIS — J029 Acute pharyngitis, unspecified: Secondary | ICD-10-CM | POA: Diagnosis not present

## 2017-05-30 DIAGNOSIS — E039 Hypothyroidism, unspecified: Secondary | ICD-10-CM | POA: Diagnosis not present

## 2017-05-30 DIAGNOSIS — E1165 Type 2 diabetes mellitus with hyperglycemia: Secondary | ICD-10-CM | POA: Diagnosis not present

## 2017-05-30 DIAGNOSIS — E282 Polycystic ovarian syndrome: Secondary | ICD-10-CM | POA: Diagnosis not present

## 2017-06-13 DIAGNOSIS — F331 Major depressive disorder, recurrent, moderate: Secondary | ICD-10-CM | POA: Diagnosis not present

## 2017-06-26 DIAGNOSIS — F331 Major depressive disorder, recurrent, moderate: Secondary | ICD-10-CM | POA: Diagnosis not present

## 2017-07-04 DIAGNOSIS — Z0000 Encounter for general adult medical examination without abnormal findings: Secondary | ICD-10-CM | POA: Diagnosis not present

## 2017-07-04 DIAGNOSIS — T7849XD Other allergy, subsequent encounter: Secondary | ICD-10-CM | POA: Diagnosis not present

## 2017-07-04 DIAGNOSIS — Z13228 Encounter for screening for other metabolic disorders: Secondary | ICD-10-CM | POA: Diagnosis not present

## 2017-07-04 DIAGNOSIS — F331 Major depressive disorder, recurrent, moderate: Secondary | ICD-10-CM | POA: Diagnosis not present

## 2017-07-18 DIAGNOSIS — F331 Major depressive disorder, recurrent, moderate: Secondary | ICD-10-CM | POA: Diagnosis not present

## 2017-08-01 DIAGNOSIS — F331 Major depressive disorder, recurrent, moderate: Secondary | ICD-10-CM | POA: Diagnosis not present

## 2017-08-15 DIAGNOSIS — F331 Major depressive disorder, recurrent, moderate: Secondary | ICD-10-CM | POA: Diagnosis not present

## 2017-08-29 DIAGNOSIS — E1165 Type 2 diabetes mellitus with hyperglycemia: Secondary | ICD-10-CM | POA: Diagnosis not present

## 2017-08-29 DIAGNOSIS — E039 Hypothyroidism, unspecified: Secondary | ICD-10-CM | POA: Diagnosis not present

## 2017-09-03 DIAGNOSIS — E282 Polycystic ovarian syndrome: Secondary | ICD-10-CM | POA: Diagnosis not present

## 2017-09-03 DIAGNOSIS — E039 Hypothyroidism, unspecified: Secondary | ICD-10-CM | POA: Diagnosis not present

## 2017-09-03 DIAGNOSIS — E1165 Type 2 diabetes mellitus with hyperglycemia: Secondary | ICD-10-CM | POA: Diagnosis not present

## 2017-09-12 DIAGNOSIS — F331 Major depressive disorder, recurrent, moderate: Secondary | ICD-10-CM | POA: Diagnosis not present

## 2017-10-10 DIAGNOSIS — F331 Major depressive disorder, recurrent, moderate: Secondary | ICD-10-CM | POA: Diagnosis not present

## 2017-10-11 DIAGNOSIS — N920 Excessive and frequent menstruation with regular cycle: Secondary | ICD-10-CM | POA: Diagnosis not present

## 2017-10-11 DIAGNOSIS — N939 Abnormal uterine and vaginal bleeding, unspecified: Secondary | ICD-10-CM | POA: Diagnosis not present

## 2017-10-11 DIAGNOSIS — F329 Major depressive disorder, single episode, unspecified: Secondary | ICD-10-CM | POA: Diagnosis not present

## 2017-10-24 ENCOUNTER — Other Ambulatory Visit: Payer: Self-pay | Admitting: Obstetrics and Gynecology

## 2017-10-24 DIAGNOSIS — Z124 Encounter for screening for malignant neoplasm of cervix: Secondary | ICD-10-CM | POA: Diagnosis not present

## 2017-10-24 DIAGNOSIS — Z6841 Body Mass Index (BMI) 40.0 and over, adult: Secondary | ICD-10-CM | POA: Diagnosis not present

## 2017-10-24 DIAGNOSIS — Z01419 Encounter for gynecological examination (general) (routine) without abnormal findings: Secondary | ICD-10-CM | POA: Diagnosis not present

## 2017-10-24 DIAGNOSIS — N920 Excessive and frequent menstruation with regular cycle: Secondary | ICD-10-CM | POA: Diagnosis not present

## 2017-10-24 DIAGNOSIS — F329 Major depressive disorder, single episode, unspecified: Secondary | ICD-10-CM | POA: Diagnosis not present

## 2017-10-24 DIAGNOSIS — F331 Major depressive disorder, recurrent, moderate: Secondary | ICD-10-CM | POA: Diagnosis not present

## 2017-10-24 DIAGNOSIS — Z1231 Encounter for screening mammogram for malignant neoplasm of breast: Secondary | ICD-10-CM | POA: Diagnosis not present

## 2017-10-29 DIAGNOSIS — Z09 Encounter for follow-up examination after completed treatment for conditions other than malignant neoplasm: Secondary | ICD-10-CM | POA: Diagnosis not present

## 2017-10-29 DIAGNOSIS — F329 Major depressive disorder, single episode, unspecified: Secondary | ICD-10-CM | POA: Diagnosis not present

## 2017-11-07 DIAGNOSIS — F331 Major depressive disorder, recurrent, moderate: Secondary | ICD-10-CM | POA: Diagnosis not present

## 2017-11-21 DIAGNOSIS — F331 Major depressive disorder, recurrent, moderate: Secondary | ICD-10-CM | POA: Diagnosis not present

## 2017-12-03 DIAGNOSIS — F329 Major depressive disorder, single episode, unspecified: Secondary | ICD-10-CM | POA: Diagnosis not present

## 2017-12-03 DIAGNOSIS — N921 Excessive and frequent menstruation with irregular cycle: Secondary | ICD-10-CM | POA: Diagnosis not present

## 2017-12-03 DIAGNOSIS — M25569 Pain in unspecified knee: Secondary | ICD-10-CM | POA: Diagnosis not present

## 2017-12-03 DIAGNOSIS — S86911A Strain of unspecified muscle(s) and tendon(s) at lower leg level, right leg, initial encounter: Secondary | ICD-10-CM | POA: Diagnosis not present

## 2017-12-03 DIAGNOSIS — Z09 Encounter for follow-up examination after completed treatment for conditions other than malignant neoplasm: Secondary | ICD-10-CM | POA: Diagnosis not present

## 2017-12-04 DIAGNOSIS — M25569 Pain in unspecified knee: Secondary | ICD-10-CM | POA: Diagnosis not present

## 2017-12-05 DIAGNOSIS — F331 Major depressive disorder, recurrent, moderate: Secondary | ICD-10-CM | POA: Diagnosis not present

## 2018-01-02 DIAGNOSIS — F331 Major depressive disorder, recurrent, moderate: Secondary | ICD-10-CM | POA: Diagnosis not present

## 2018-01-16 DIAGNOSIS — F331 Major depressive disorder, recurrent, moderate: Secondary | ICD-10-CM | POA: Diagnosis not present

## 2018-01-30 DIAGNOSIS — F331 Major depressive disorder, recurrent, moderate: Secondary | ICD-10-CM | POA: Diagnosis not present

## 2018-02-11 DIAGNOSIS — E119 Type 2 diabetes mellitus without complications: Secondary | ICD-10-CM | POA: Diagnosis not present

## 2018-02-27 DIAGNOSIS — F331 Major depressive disorder, recurrent, moderate: Secondary | ICD-10-CM | POA: Diagnosis not present

## 2018-03-10 IMAGING — US US MFM OB TRANSVAGINAL
1 series · 13 of 28 positions shown · non-contrast
Comparison: none

[Series 1: us mfm ob transvaginal · 85 acquisitions, 13 frames shown]
[im 4/85]
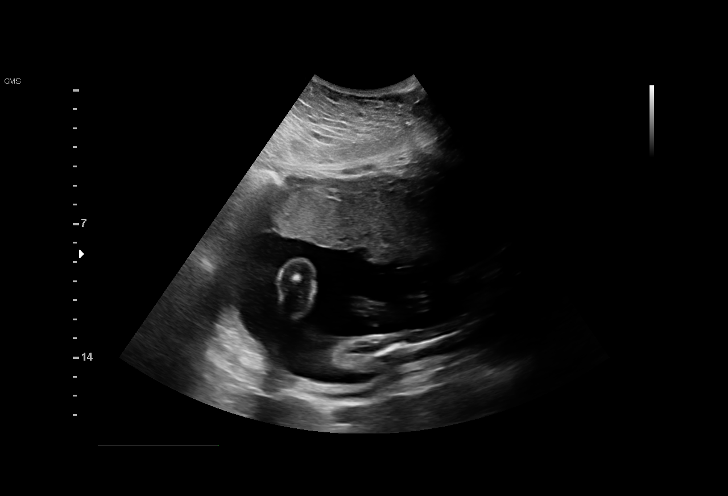
[im 10/85]
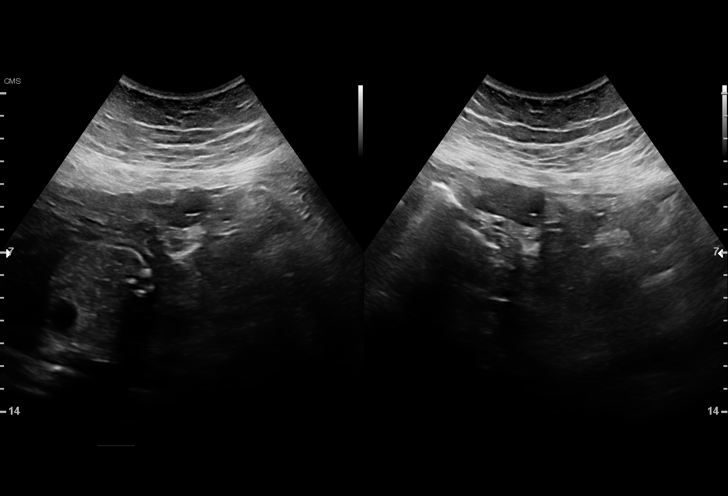
[im 16/85]
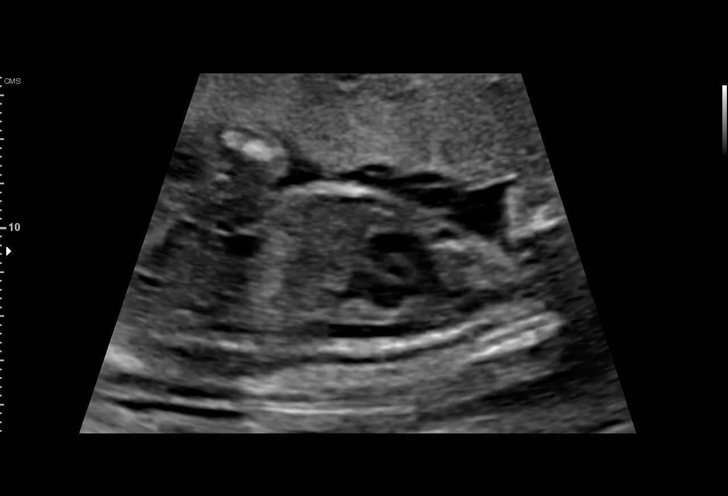
[im 22/85]
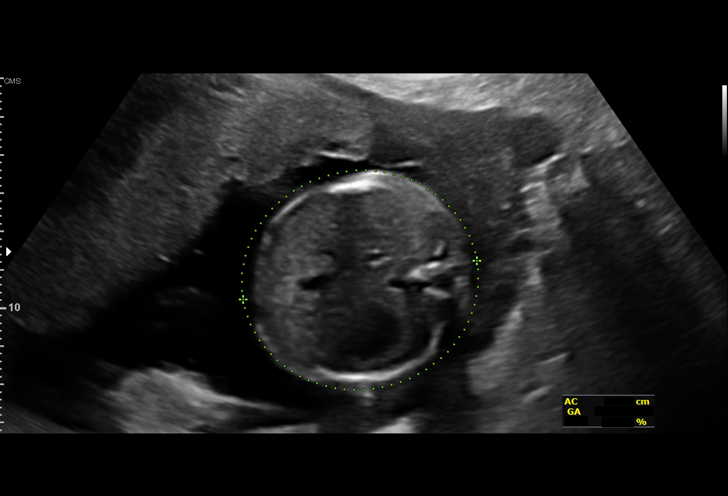
[im 29/85]
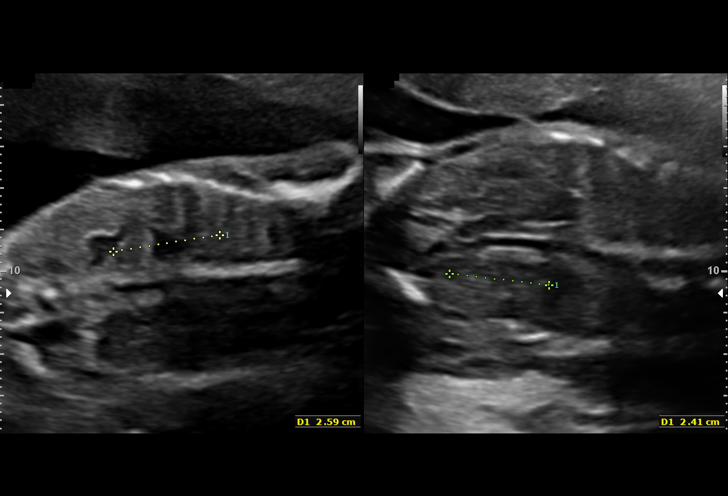
[im 35/85]
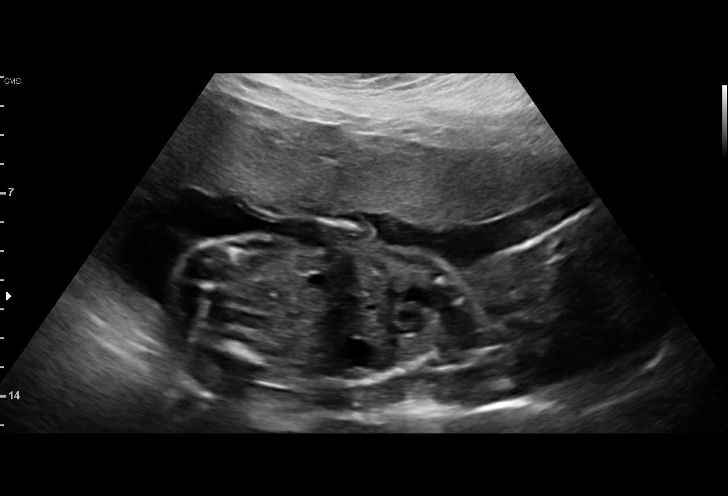
[im 44/85]
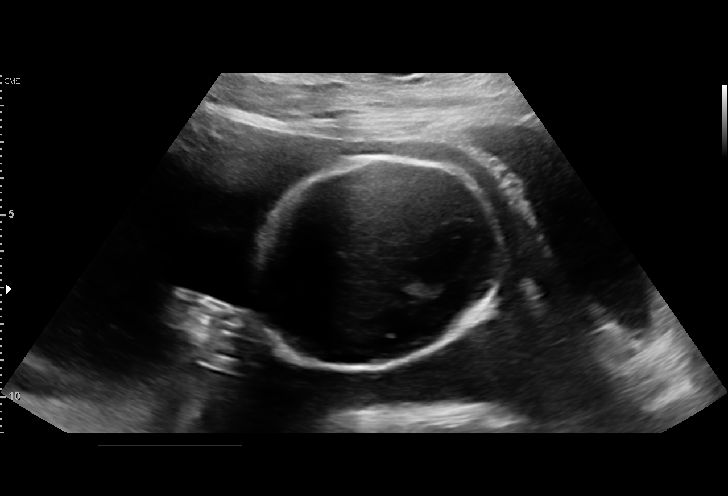
[im 50/85]
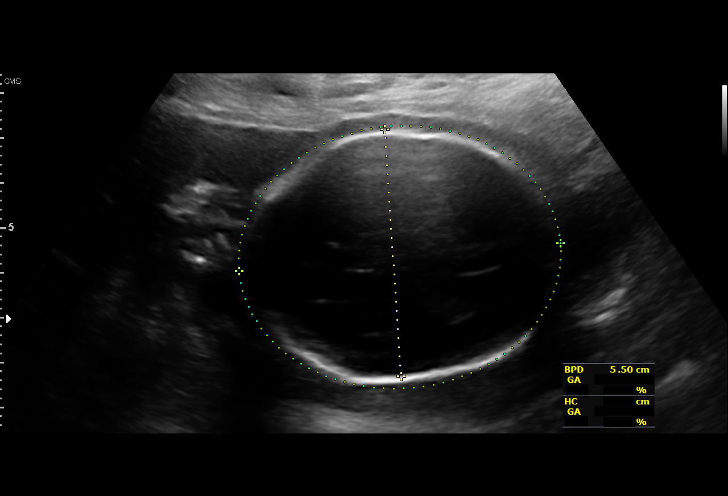
[im 57/85]
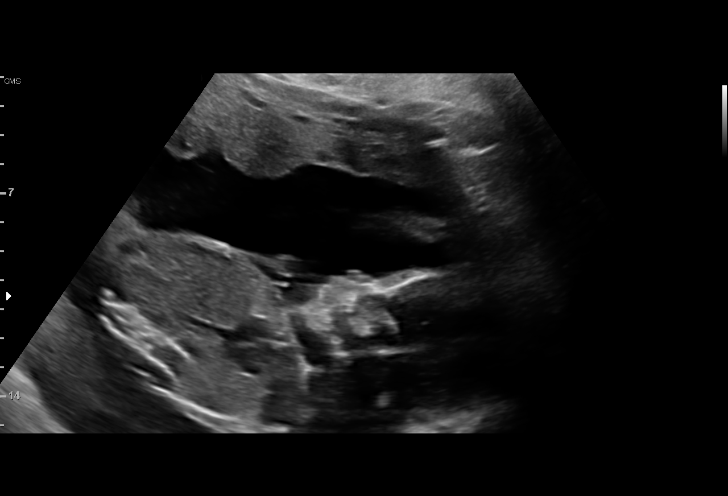
[im 63/85]
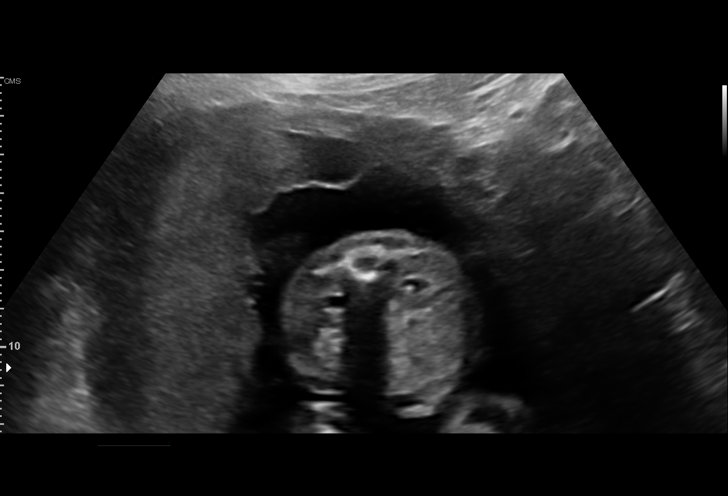
[im 69/85]
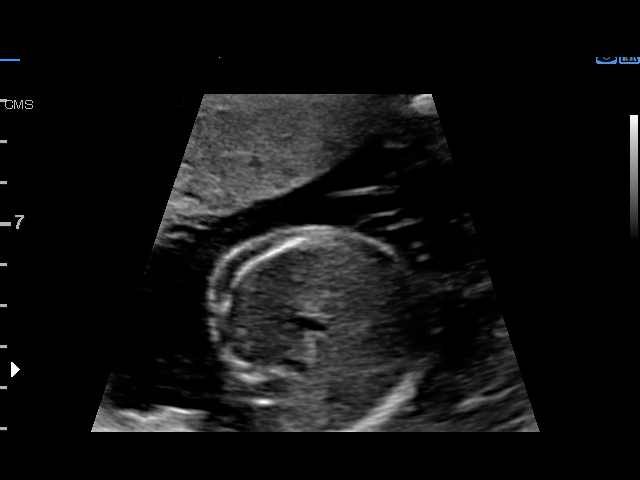
[im 75/85]
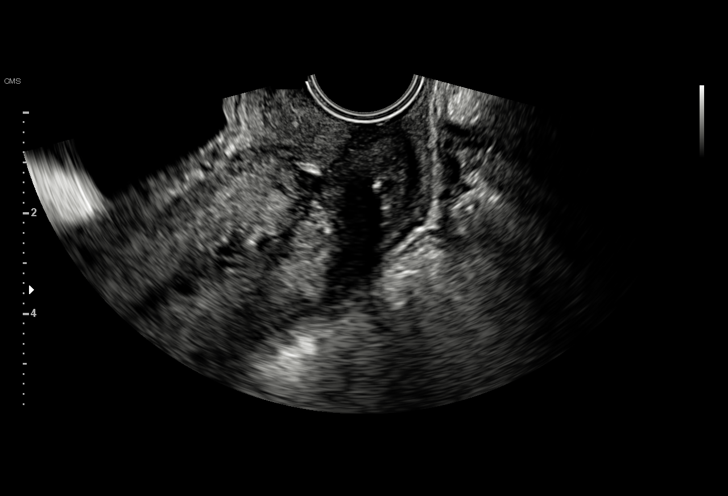
[im 81/85]
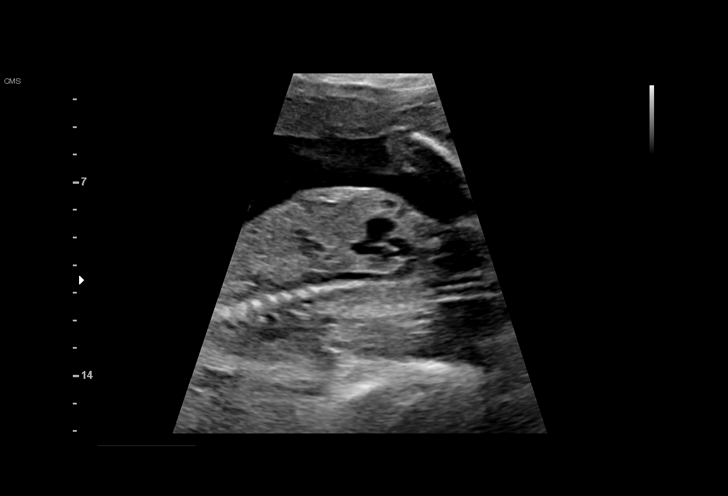

[13 of 28 positions shown; findings below may reference images not displayed]

Obstetrics &
Gynecology
[address]
[place].

1  CHAI TIGER            [id number]      [id number]     [id number]
2  CHAI TIGER            [id number]      [id number]     [id number]
Indications

23 weeks gestation of pregnancy
Encounter for fetal anatomic survey
Advanced maternal age multigravida 35+,
second trimester (Panorama insufficient)
Obesity complicating pregnancy, second
trimester
Previous cesarean delivery, antepartum
Pre-existing diabetes, type 2, in pregnancy,
second trimester
Hypothyroid
Poor obstetric history: Previous preterm
delivery, antepartum (23 weeks)
OB History

Gravidity:    4         Term:   1        Prem:   1        SAB:   1
TOP:          0       Ectopic:  0        Living: 1
Fetal Evaluation

Num Of Fetuses:     1
Fetal Heart         149
Rate(bpm):
Cardiac Activity:   Observed
Presentation:       Cephalic
Placenta:           Anterior, above cervical os
P. Cord Insertion:  Visualized
Amniotic Fluid
AFI FV:      Subjectively within normal limits

Largest Pocket(cm)
6.37
Biometry

BPD:        55  mm     G. Age:  22w 6d         26  %    CI:        72.59   %    70 - 86
FL/HC:      20.4   %    19.2 -
HC:      205.3  mm     G. Age:  22w 4d         15  %    HC/AC:      1.11        1.05 -
AC:      184.3  mm     G. Age:  23w 2d         40  %    FL/BPD:     76.0   %    71 - 87
FL:       41.8  mm     G. Age:  23w 4d         49  %    FL/AC:      22.7   %    20 - 24
HUM:      38.4  mm     G. Age:  23w 4d         48  %
CER:      22.8  mm     G. Age:  21w 3d          9  %

CM:        6.6  mm

Est. FW:     581  gm      1 lb 4 oz     53  %
Gestational Age

LMP:           23w 2d        Date:  06/27/16                 EDD:   04/03/17
U/S Today:     23w 1d                                        EDD:   04/04/17
Best:          23w 2d     Det. By:  LMP  (06/27/16)          EDD:   04/03/17
Anatomy

Cranium:               Appears normal         Aortic Arch:            Appears normal
Cavum:                 Appears normal         Ductal Arch:            Appears normal
Ventricles:            Appears normal         Diaphragm:              Appears normal
Choroid Plexus:        Appears normal         Stomach:                Appears normal, left
sided
Cerebellum:            Appears normal         Abdomen:                Appears normal
Posterior Fossa:       Appears normal         Abdominal Wall:         Appears nml (cord
insert, abd wall)
Nuchal Fold:           Not applicable (>20    Cord Vessels:           Appears normal (3
wks GA)                                        vessel cord)
Face:                  Appears normal         Kidneys:                Appear normal
(orbits and profile)
Lips:                  Appears normal         Bladder:                Appears normal
Thoracic:              Appears normal         Spine:                  Appears normal
Heart:                 Appears normal         Upper Extremities:      Appears normal
(4CH, axis, and
situs)
RVOT:                  Not well visualized    Lower Extremities:      Appears normal
LVOT:                  Appears normal

Other:  Fetus appears to be a female.
Cervix Uterus Adnexa

Cervix
Measured transvaginally. Cerclage visualized.

Uterus
No abnormality visualized.

Left Ovary
Within normal limits.
Right Ovary
Within normal limits.

Cul De Sac:   No free fluid seen.

Adnexa:       No abnormality visualized.
Impression

Singleton intrauterine pregnancy at 23+2 weeks with AMA,
type 2 DM, history of PTD. Choroid plexus cysts seen on
office US
Review of the anatomy shows no sonographic markers for
aneuploidy or structural anomalies
However, evaluations should be considered suboptimal
secondary to maternal body habitus, fetal position. No
choroid plexus cysts are seen on US today, but views are
suboptimal
Amniotic fluid volume is normal
Estimated fetal weight is 581g which is growth in the 53rd
percentile
Transvaginal cervical evaluation shows a 39mm cervix
without funneling and a cerclage situated in the distal third of
the cervix
Recommendations

See MFM conslut

## 2018-03-13 DIAGNOSIS — F331 Major depressive disorder, recurrent, moderate: Secondary | ICD-10-CM | POA: Diagnosis not present

## 2018-04-10 DIAGNOSIS — F331 Major depressive disorder, recurrent, moderate: Secondary | ICD-10-CM | POA: Diagnosis not present

## 2018-04-17 DIAGNOSIS — R202 Paresthesia of skin: Secondary | ICD-10-CM | POA: Diagnosis not present

## 2018-04-17 DIAGNOSIS — M5412 Radiculopathy, cervical region: Secondary | ICD-10-CM | POA: Diagnosis not present

## 2018-04-17 DIAGNOSIS — R05 Cough: Secondary | ICD-10-CM | POA: Diagnosis not present

## 2018-04-17 DIAGNOSIS — J019 Acute sinusitis, unspecified: Secondary | ICD-10-CM | POA: Diagnosis not present

## 2018-05-06 DIAGNOSIS — F331 Major depressive disorder, recurrent, moderate: Secondary | ICD-10-CM | POA: Diagnosis not present

## 2018-06-11 DIAGNOSIS — E1165 Type 2 diabetes mellitus with hyperglycemia: Secondary | ICD-10-CM | POA: Diagnosis not present

## 2018-06-11 DIAGNOSIS — E039 Hypothyroidism, unspecified: Secondary | ICD-10-CM | POA: Diagnosis not present

## 2018-06-11 DIAGNOSIS — E282 Polycystic ovarian syndrome: Secondary | ICD-10-CM | POA: Diagnosis not present

## 2018-06-12 DIAGNOSIS — F331 Major depressive disorder, recurrent, moderate: Secondary | ICD-10-CM | POA: Diagnosis not present

## 2018-06-14 DIAGNOSIS — E039 Hypothyroidism, unspecified: Secondary | ICD-10-CM | POA: Diagnosis not present

## 2018-06-14 DIAGNOSIS — E282 Polycystic ovarian syndrome: Secondary | ICD-10-CM | POA: Diagnosis not present

## 2018-06-14 DIAGNOSIS — E1165 Type 2 diabetes mellitus with hyperglycemia: Secondary | ICD-10-CM | POA: Diagnosis not present

## 2018-07-01 DIAGNOSIS — F331 Major depressive disorder, recurrent, moderate: Secondary | ICD-10-CM | POA: Diagnosis not present

## 2018-07-03 DIAGNOSIS — J019 Acute sinusitis, unspecified: Secondary | ICD-10-CM | POA: Diagnosis not present

## 2018-07-29 DIAGNOSIS — F331 Major depressive disorder, recurrent, moderate: Secondary | ICD-10-CM | POA: Diagnosis not present

## 2018-08-14 DIAGNOSIS — F331 Major depressive disorder, recurrent, moderate: Secondary | ICD-10-CM | POA: Diagnosis not present

## 2018-08-30 DIAGNOSIS — F331 Major depressive disorder, recurrent, moderate: Secondary | ICD-10-CM | POA: Diagnosis not present

## 2018-08-30 DIAGNOSIS — Z3202 Encounter for pregnancy test, result negative: Secondary | ICD-10-CM | POA: Diagnosis not present

## 2018-08-30 DIAGNOSIS — R3121 Asymptomatic microscopic hematuria: Secondary | ICD-10-CM | POA: Diagnosis not present

## 2018-09-11 DIAGNOSIS — F331 Major depressive disorder, recurrent, moderate: Secondary | ICD-10-CM | POA: Diagnosis not present

## 2018-09-12 DIAGNOSIS — E282 Polycystic ovarian syndrome: Secondary | ICD-10-CM | POA: Diagnosis not present

## 2018-09-12 DIAGNOSIS — E039 Hypothyroidism, unspecified: Secondary | ICD-10-CM | POA: Diagnosis not present

## 2018-09-12 DIAGNOSIS — E1165 Type 2 diabetes mellitus with hyperglycemia: Secondary | ICD-10-CM | POA: Diagnosis not present

## 2018-11-01 DIAGNOSIS — F331 Major depressive disorder, recurrent, moderate: Secondary | ICD-10-CM | POA: Diagnosis not present

## 2018-11-05 DIAGNOSIS — L209 Atopic dermatitis, unspecified: Secondary | ICD-10-CM | POA: Diagnosis not present

## 2018-11-22 DIAGNOSIS — F331 Major depressive disorder, recurrent, moderate: Secondary | ICD-10-CM | POA: Diagnosis not present

## 2018-12-06 DIAGNOSIS — N760 Acute vaginitis: Secondary | ICD-10-CM | POA: Diagnosis not present

## 2018-12-06 DIAGNOSIS — F331 Major depressive disorder, recurrent, moderate: Secondary | ICD-10-CM | POA: Diagnosis not present

## 2018-12-24 DIAGNOSIS — F331 Major depressive disorder, recurrent, moderate: Secondary | ICD-10-CM | POA: Diagnosis not present

## 2019-01-10 DIAGNOSIS — E1165 Type 2 diabetes mellitus with hyperglycemia: Secondary | ICD-10-CM | POA: Diagnosis not present

## 2019-01-10 DIAGNOSIS — E039 Hypothyroidism, unspecified: Secondary | ICD-10-CM | POA: Diagnosis not present

## 2019-01-10 DIAGNOSIS — E282 Polycystic ovarian syndrome: Secondary | ICD-10-CM | POA: Diagnosis not present

## 2019-01-14 DIAGNOSIS — F331 Major depressive disorder, recurrent, moderate: Secondary | ICD-10-CM | POA: Diagnosis not present

## 2019-01-16 DIAGNOSIS — R809 Proteinuria, unspecified: Secondary | ICD-10-CM | POA: Diagnosis not present

## 2019-01-16 DIAGNOSIS — E039 Hypothyroidism, unspecified: Secondary | ICD-10-CM | POA: Diagnosis not present

## 2019-01-16 DIAGNOSIS — E1165 Type 2 diabetes mellitus with hyperglycemia: Secondary | ICD-10-CM | POA: Diagnosis not present

## 2019-01-16 DIAGNOSIS — E282 Polycystic ovarian syndrome: Secondary | ICD-10-CM | POA: Diagnosis not present

## 2019-02-07 DIAGNOSIS — F331 Major depressive disorder, recurrent, moderate: Secondary | ICD-10-CM | POA: Diagnosis not present

## 2019-02-17 DIAGNOSIS — Z319 Encounter for procreative management, unspecified: Secondary | ICD-10-CM | POA: Diagnosis not present

## 2019-02-17 DIAGNOSIS — Z124 Encounter for screening for malignant neoplasm of cervix: Secondary | ICD-10-CM | POA: Diagnosis not present

## 2019-02-17 DIAGNOSIS — N898 Other specified noninflammatory disorders of vagina: Secondary | ICD-10-CM | POA: Diagnosis not present

## 2019-02-17 DIAGNOSIS — Z01419 Encounter for gynecological examination (general) (routine) without abnormal findings: Secondary | ICD-10-CM | POA: Diagnosis not present

## 2019-02-17 DIAGNOSIS — Z139 Encounter for screening, unspecified: Secondary | ICD-10-CM | POA: Diagnosis not present

## 2019-02-18 DIAGNOSIS — L905 Scar conditions and fibrosis of skin: Secondary | ICD-10-CM | POA: Diagnosis not present

## 2019-02-18 DIAGNOSIS — L648 Other androgenic alopecia: Secondary | ICD-10-CM | POA: Diagnosis not present

## 2019-02-18 DIAGNOSIS — L918 Other hypertrophic disorders of the skin: Secondary | ICD-10-CM | POA: Diagnosis not present

## 2019-02-28 DIAGNOSIS — F331 Major depressive disorder, recurrent, moderate: Secondary | ICD-10-CM | POA: Diagnosis not present

## 2019-03-03 DIAGNOSIS — Z1231 Encounter for screening mammogram for malignant neoplasm of breast: Secondary | ICD-10-CM | POA: Diagnosis not present

## 2019-03-18 DIAGNOSIS — L905 Scar conditions and fibrosis of skin: Secondary | ICD-10-CM | POA: Diagnosis not present

## 2019-03-21 DIAGNOSIS — F331 Major depressive disorder, recurrent, moderate: Secondary | ICD-10-CM | POA: Diagnosis not present

## 2019-03-24 DIAGNOSIS — M9901 Segmental and somatic dysfunction of cervical region: Secondary | ICD-10-CM | POA: Diagnosis not present

## 2019-03-24 DIAGNOSIS — R519 Headache, unspecified: Secondary | ICD-10-CM | POA: Diagnosis not present

## 2019-03-24 DIAGNOSIS — M5032 Other cervical disc degeneration, mid-cervical region, unspecified level: Secondary | ICD-10-CM | POA: Diagnosis not present

## 2019-03-24 DIAGNOSIS — M531 Cervicobrachial syndrome: Secondary | ICD-10-CM | POA: Diagnosis not present

## 2019-03-25 DIAGNOSIS — M9901 Segmental and somatic dysfunction of cervical region: Secondary | ICD-10-CM | POA: Diagnosis not present

## 2019-03-25 DIAGNOSIS — M531 Cervicobrachial syndrome: Secondary | ICD-10-CM | POA: Diagnosis not present

## 2019-03-25 DIAGNOSIS — R519 Headache, unspecified: Secondary | ICD-10-CM | POA: Diagnosis not present

## 2019-03-25 DIAGNOSIS — M5032 Other cervical disc degeneration, mid-cervical region, unspecified level: Secondary | ICD-10-CM | POA: Diagnosis not present

## 2019-03-28 DIAGNOSIS — M531 Cervicobrachial syndrome: Secondary | ICD-10-CM | POA: Diagnosis not present

## 2019-03-28 DIAGNOSIS — M5032 Other cervical disc degeneration, mid-cervical region, unspecified level: Secondary | ICD-10-CM | POA: Diagnosis not present

## 2019-03-28 DIAGNOSIS — R519 Headache, unspecified: Secondary | ICD-10-CM | POA: Diagnosis not present

## 2019-03-28 DIAGNOSIS — M9901 Segmental and somatic dysfunction of cervical region: Secondary | ICD-10-CM | POA: Diagnosis not present

## 2019-03-31 DIAGNOSIS — E1165 Type 2 diabetes mellitus with hyperglycemia: Secondary | ICD-10-CM | POA: Diagnosis not present

## 2019-03-31 DIAGNOSIS — E039 Hypothyroidism, unspecified: Secondary | ICD-10-CM | POA: Diagnosis not present

## 2019-03-31 DIAGNOSIS — E282 Polycystic ovarian syndrome: Secondary | ICD-10-CM | POA: Diagnosis not present

## 2019-03-31 DIAGNOSIS — R319 Hematuria, unspecified: Secondary | ICD-10-CM | POA: Diagnosis not present

## 2019-04-02 DIAGNOSIS — R519 Headache, unspecified: Secondary | ICD-10-CM | POA: Diagnosis not present

## 2019-04-02 DIAGNOSIS — M5032 Other cervical disc degeneration, mid-cervical region, unspecified level: Secondary | ICD-10-CM | POA: Diagnosis not present

## 2019-04-02 DIAGNOSIS — M9901 Segmental and somatic dysfunction of cervical region: Secondary | ICD-10-CM | POA: Diagnosis not present

## 2019-04-02 DIAGNOSIS — M531 Cervicobrachial syndrome: Secondary | ICD-10-CM | POA: Diagnosis not present

## 2019-04-04 DIAGNOSIS — R519 Headache, unspecified: Secondary | ICD-10-CM | POA: Diagnosis not present

## 2019-04-04 DIAGNOSIS — M531 Cervicobrachial syndrome: Secondary | ICD-10-CM | POA: Diagnosis not present

## 2019-04-04 DIAGNOSIS — M9901 Segmental and somatic dysfunction of cervical region: Secondary | ICD-10-CM | POA: Diagnosis not present

## 2019-04-04 DIAGNOSIS — M5032 Other cervical disc degeneration, mid-cervical region, unspecified level: Secondary | ICD-10-CM | POA: Diagnosis not present

## 2019-04-08 DIAGNOSIS — M9901 Segmental and somatic dysfunction of cervical region: Secondary | ICD-10-CM | POA: Diagnosis not present

## 2019-04-08 DIAGNOSIS — R519 Headache, unspecified: Secondary | ICD-10-CM | POA: Diagnosis not present

## 2019-04-08 DIAGNOSIS — M531 Cervicobrachial syndrome: Secondary | ICD-10-CM | POA: Diagnosis not present

## 2019-04-08 DIAGNOSIS — M5032 Other cervical disc degeneration, mid-cervical region, unspecified level: Secondary | ICD-10-CM | POA: Diagnosis not present

## 2019-04-11 DIAGNOSIS — M5032 Other cervical disc degeneration, mid-cervical region, unspecified level: Secondary | ICD-10-CM | POA: Diagnosis not present

## 2019-04-11 DIAGNOSIS — F331 Major depressive disorder, recurrent, moderate: Secondary | ICD-10-CM | POA: Diagnosis not present

## 2019-04-11 DIAGNOSIS — M9901 Segmental and somatic dysfunction of cervical region: Secondary | ICD-10-CM | POA: Diagnosis not present

## 2019-04-11 DIAGNOSIS — R519 Headache, unspecified: Secondary | ICD-10-CM | POA: Diagnosis not present

## 2019-04-11 DIAGNOSIS — M531 Cervicobrachial syndrome: Secondary | ICD-10-CM | POA: Diagnosis not present

## 2019-04-15 DIAGNOSIS — R519 Headache, unspecified: Secondary | ICD-10-CM | POA: Diagnosis not present

## 2019-04-15 DIAGNOSIS — M5032 Other cervical disc degeneration, mid-cervical region, unspecified level: Secondary | ICD-10-CM | POA: Diagnosis not present

## 2019-04-15 DIAGNOSIS — M9901 Segmental and somatic dysfunction of cervical region: Secondary | ICD-10-CM | POA: Diagnosis not present

## 2019-04-15 DIAGNOSIS — M531 Cervicobrachial syndrome: Secondary | ICD-10-CM | POA: Diagnosis not present

## 2019-04-18 DIAGNOSIS — M5032 Other cervical disc degeneration, mid-cervical region, unspecified level: Secondary | ICD-10-CM | POA: Diagnosis not present

## 2019-04-18 DIAGNOSIS — M9901 Segmental and somatic dysfunction of cervical region: Secondary | ICD-10-CM | POA: Diagnosis not present

## 2019-04-18 DIAGNOSIS — M531 Cervicobrachial syndrome: Secondary | ICD-10-CM | POA: Diagnosis not present

## 2019-04-18 DIAGNOSIS — R519 Headache, unspecified: Secondary | ICD-10-CM | POA: Diagnosis not present

## 2019-04-21 DIAGNOSIS — L905 Scar conditions and fibrosis of skin: Secondary | ICD-10-CM | POA: Diagnosis not present

## 2019-04-21 DIAGNOSIS — L918 Other hypertrophic disorders of the skin: Secondary | ICD-10-CM | POA: Diagnosis not present

## 2019-04-22 DIAGNOSIS — M5032 Other cervical disc degeneration, mid-cervical region, unspecified level: Secondary | ICD-10-CM | POA: Diagnosis not present

## 2019-04-22 DIAGNOSIS — M9901 Segmental and somatic dysfunction of cervical region: Secondary | ICD-10-CM | POA: Diagnosis not present

## 2019-04-22 DIAGNOSIS — M531 Cervicobrachial syndrome: Secondary | ICD-10-CM | POA: Diagnosis not present

## 2019-04-22 DIAGNOSIS — R519 Headache, unspecified: Secondary | ICD-10-CM | POA: Diagnosis not present

## 2019-05-05 DIAGNOSIS — F331 Major depressive disorder, recurrent, moderate: Secondary | ICD-10-CM | POA: Diagnosis not present

## 2019-05-06 DIAGNOSIS — M9901 Segmental and somatic dysfunction of cervical region: Secondary | ICD-10-CM | POA: Diagnosis not present

## 2019-05-06 DIAGNOSIS — M5032 Other cervical disc degeneration, mid-cervical region, unspecified level: Secondary | ICD-10-CM | POA: Diagnosis not present

## 2019-05-06 DIAGNOSIS — M531 Cervicobrachial syndrome: Secondary | ICD-10-CM | POA: Diagnosis not present

## 2019-05-06 DIAGNOSIS — R519 Headache, unspecified: Secondary | ICD-10-CM | POA: Diagnosis not present

## 2019-05-20 DIAGNOSIS — M9901 Segmental and somatic dysfunction of cervical region: Secondary | ICD-10-CM | POA: Diagnosis not present

## 2019-05-20 DIAGNOSIS — M5032 Other cervical disc degeneration, mid-cervical region, unspecified level: Secondary | ICD-10-CM | POA: Diagnosis not present

## 2019-05-20 DIAGNOSIS — R519 Headache, unspecified: Secondary | ICD-10-CM | POA: Diagnosis not present

## 2019-05-20 DIAGNOSIS — M531 Cervicobrachial syndrome: Secondary | ICD-10-CM | POA: Diagnosis not present

## 2019-05-21 DIAGNOSIS — L910 Hypertrophic scar: Secondary | ICD-10-CM | POA: Diagnosis not present

## 2019-05-21 DIAGNOSIS — L700 Acne vulgaris: Secondary | ICD-10-CM | POA: Diagnosis not present

## 2019-05-22 DIAGNOSIS — F331 Major depressive disorder, recurrent, moderate: Secondary | ICD-10-CM | POA: Diagnosis not present

## 2019-06-03 DIAGNOSIS — M5032 Other cervical disc degeneration, mid-cervical region, unspecified level: Secondary | ICD-10-CM | POA: Diagnosis not present

## 2019-06-03 DIAGNOSIS — F331 Major depressive disorder, recurrent, moderate: Secondary | ICD-10-CM | POA: Diagnosis not present

## 2019-06-03 DIAGNOSIS — M9901 Segmental and somatic dysfunction of cervical region: Secondary | ICD-10-CM | POA: Diagnosis not present

## 2019-06-03 DIAGNOSIS — R519 Headache, unspecified: Secondary | ICD-10-CM | POA: Diagnosis not present

## 2019-06-03 DIAGNOSIS — M531 Cervicobrachial syndrome: Secondary | ICD-10-CM | POA: Diagnosis not present

## 2019-06-17 DIAGNOSIS — F331 Major depressive disorder, recurrent, moderate: Secondary | ICD-10-CM | POA: Diagnosis not present

## 2019-06-23 DIAGNOSIS — L910 Hypertrophic scar: Secondary | ICD-10-CM | POA: Diagnosis not present

## 2019-07-01 DIAGNOSIS — F331 Major depressive disorder, recurrent, moderate: Secondary | ICD-10-CM | POA: Diagnosis not present

## 2019-07-10 ENCOUNTER — Ambulatory Visit: Payer: BLUE CROSS/BLUE SHIELD | Attending: Family

## 2019-07-10 DIAGNOSIS — Z23 Encounter for immunization: Secondary | ICD-10-CM

## 2019-07-10 NOTE — Progress Notes (Signed)
   Covid-19 Vaccination Clinic  Name:  Kathryn Howard    MRN: [id number] DOB: Apr 26, 1976  07/10/2019  [name] was observed post Covid-19 immunization for 30 minutes based on pre-vaccination screening without incident. She was provided with Vaccine Information Sheet and instruction to access the V-Safe system.   [name] was instructed to call 911 with any severe reactions post vaccine: Marland Kitchen Difficulty breathing  . Swelling of face and throat  . A fast heartbeat  . A bad rash all over body  . Dizziness and weakness   Immunizations Administered    Name Date Dose VIS Date Route   Moderna COVID-19 Vaccine 07/10/2019 12:48 PM 0.5 mL 04/01/2019 Intramuscular   Manufacturer: Moderna   Lot: [id number]   NDC: 67672-094-70

## 2019-07-13 DIAGNOSIS — J029 Acute pharyngitis, unspecified: Secondary | ICD-10-CM | POA: Diagnosis not present

## 2019-07-13 DIAGNOSIS — J309 Allergic rhinitis, unspecified: Secondary | ICD-10-CM | POA: Diagnosis not present

## 2019-07-15 DIAGNOSIS — F331 Major depressive disorder, recurrent, moderate: Secondary | ICD-10-CM | POA: Diagnosis not present

## 2019-07-29 DIAGNOSIS — F331 Major depressive disorder, recurrent, moderate: Secondary | ICD-10-CM | POA: Diagnosis not present

## 2019-08-04 DIAGNOSIS — Z20822 Contact with and (suspected) exposure to covid-19: Secondary | ICD-10-CM | POA: Diagnosis not present

## 2019-08-05 ENCOUNTER — Ambulatory Visit: Payer: Self-pay | Attending: Family

## 2019-08-05 DIAGNOSIS — Z23 Encounter for immunization: Secondary | ICD-10-CM

## 2019-08-05 NOTE — Progress Notes (Signed)
   Covid-19 Vaccination Clinic  Name:  Lenise Jr    MRN: [id number] DOB: [birth year]  08/05/2019  Ms. Winberry was observed post Covid-19 immunization for 15 minutes without incident. She was provided with Vaccine Information Sheet and instruction to access the V-Safe system.   Ms. Chriscoe was instructed to call 911 with any severe reactions post vaccine: Marland Kitchen Difficulty breathing  . Swelling of face and throat  . A fast heartbeat  . A bad rash all over body  . Dizziness and weakness   Immunizations Administered    Name Date Dose VIS Date Route   Moderna COVID-19 Vaccine 08/05/2019  4:37 PM 0.5 mL 04/01/2019 Intramuscular   Manufacturer: Moderna   Lot: [id number]   NDC: 12244-975-30

## 2019-08-21 DIAGNOSIS — L03119 Cellulitis of unspecified part of limb: Secondary | ICD-10-CM | POA: Diagnosis not present

## 2019-08-21 DIAGNOSIS — R238 Other skin changes: Secondary | ICD-10-CM | POA: Diagnosis not present

## 2019-08-26 DIAGNOSIS — F331 Major depressive disorder, recurrent, moderate: Secondary | ICD-10-CM | POA: Diagnosis not present

## 2019-09-09 DIAGNOSIS — F331 Major depressive disorder, recurrent, moderate: Secondary | ICD-10-CM | POA: Diagnosis not present

## 2019-09-17 DIAGNOSIS — F331 Major depressive disorder, recurrent, moderate: Secondary | ICD-10-CM | POA: Diagnosis not present

## 2019-09-22 DIAGNOSIS — J309 Allergic rhinitis, unspecified: Secondary | ICD-10-CM | POA: Diagnosis not present

## 2019-09-22 DIAGNOSIS — J019 Acute sinusitis, unspecified: Secondary | ICD-10-CM | POA: Diagnosis not present

## 2019-10-07 DIAGNOSIS — F331 Major depressive disorder, recurrent, moderate: Secondary | ICD-10-CM | POA: Diagnosis not present

## 2019-10-24 DIAGNOSIS — F331 Major depressive disorder, recurrent, moderate: Secondary | ICD-10-CM | POA: Diagnosis not present

## 2019-10-29 DIAGNOSIS — J029 Acute pharyngitis, unspecified: Secondary | ICD-10-CM | POA: Diagnosis not present

## 2019-11-07 DIAGNOSIS — F331 Major depressive disorder, recurrent, moderate: Secondary | ICD-10-CM | POA: Diagnosis not present

## 2019-11-21 DIAGNOSIS — F331 Major depressive disorder, recurrent, moderate: Secondary | ICD-10-CM | POA: Diagnosis not present

## 2019-12-02 DIAGNOSIS — F331 Major depressive disorder, recurrent, moderate: Secondary | ICD-10-CM | POA: Diagnosis not present

## 2019-12-12 DIAGNOSIS — F331 Major depressive disorder, recurrent, moderate: Secondary | ICD-10-CM | POA: Diagnosis not present

## 2019-12-30 DIAGNOSIS — F331 Major depressive disorder, recurrent, moderate: Secondary | ICD-10-CM | POA: Diagnosis not present

## 2020-01-13 DIAGNOSIS — F331 Major depressive disorder, recurrent, moderate: Secondary | ICD-10-CM | POA: Diagnosis not present

## 2020-01-26 DIAGNOSIS — Z20822 Contact with and (suspected) exposure to covid-19: Secondary | ICD-10-CM | POA: Diagnosis not present

## 2020-01-30 DIAGNOSIS — F331 Major depressive disorder, recurrent, moderate: Secondary | ICD-10-CM | POA: Diagnosis not present

## 2020-02-10 DIAGNOSIS — F331 Major depressive disorder, recurrent, moderate: Secondary | ICD-10-CM | POA: Diagnosis not present

## 2020-03-01 DIAGNOSIS — Z124 Encounter for screening for malignant neoplasm of cervix: Secondary | ICD-10-CM | POA: Diagnosis not present

## 2020-03-01 DIAGNOSIS — Z1231 Encounter for screening mammogram for malignant neoplasm of breast: Secondary | ICD-10-CM | POA: Diagnosis not present

## 2020-03-01 DIAGNOSIS — Z01419 Encounter for gynecological examination (general) (routine) without abnormal findings: Secondary | ICD-10-CM | POA: Diagnosis not present

## 2020-03-01 DIAGNOSIS — Z319 Encounter for procreative management, unspecified: Secondary | ICD-10-CM | POA: Diagnosis not present

## 2020-03-01 DIAGNOSIS — Z139 Encounter for screening, unspecified: Secondary | ICD-10-CM | POA: Diagnosis not present

## 2020-03-05 DIAGNOSIS — F331 Major depressive disorder, recurrent, moderate: Secondary | ICD-10-CM | POA: Diagnosis not present

## 2020-03-19 DIAGNOSIS — F331 Major depressive disorder, recurrent, moderate: Secondary | ICD-10-CM | POA: Diagnosis not present

## 2020-04-02 DIAGNOSIS — F331 Major depressive disorder, recurrent, moderate: Secondary | ICD-10-CM | POA: Diagnosis not present

## 2020-08-24 ENCOUNTER — Emergency Department (HOSPITAL_COMMUNITY)
Admission: EM | Admit: 2020-08-24 | Discharge: 2020-08-24 | Disposition: A | Discharge status: Home / Self Care | Payer: BC Managed Care – PPO | Attending: Emergency Medicine | Admitting: Emergency Medicine

## 2020-08-24 ENCOUNTER — Other Ambulatory Visit: Payer: Self-pay

## 2020-08-24 DIAGNOSIS — E119 Type 2 diabetes mellitus without complications: Secondary | ICD-10-CM | POA: Diagnosis not present

## 2020-08-24 DIAGNOSIS — Z7984 Long term (current) use of oral hypoglycemic drugs: Secondary | ICD-10-CM | POA: Diagnosis not present

## 2020-08-24 DIAGNOSIS — R1012 Left upper quadrant pain: Secondary | ICD-10-CM | POA: Diagnosis present

## 2020-08-24 DIAGNOSIS — R5383 Other fatigue: Secondary | ICD-10-CM | POA: Insufficient documentation

## 2020-08-24 DIAGNOSIS — E039 Hypothyroidism, unspecified: Secondary | ICD-10-CM | POA: Diagnosis not present

## 2020-08-24 DIAGNOSIS — Z794 Long term (current) use of insulin: Secondary | ICD-10-CM | POA: Insufficient documentation

## 2020-08-24 DIAGNOSIS — J45909 Unspecified asthma, uncomplicated: Secondary | ICD-10-CM | POA: Diagnosis not present

## 2020-08-24 DIAGNOSIS — Z79899 Other long term (current) drug therapy: Secondary | ICD-10-CM | POA: Diagnosis not present

## 2020-08-24 DIAGNOSIS — R112 Nausea with vomiting, unspecified: Secondary | ICD-10-CM | POA: Insufficient documentation

## 2020-08-24 LAB — URINALYSIS, ROUTINE W REFLEX MICROSCOPIC
Bilirubin Urine: NEGATIVE
Glucose, UA: NEGATIVE mg/dL
Hgb urine dipstick: NEGATIVE
Ketones, ur: NEGATIVE mg/dL
Leukocytes,Ua: NEGATIVE
Nitrite: NEGATIVE
Protein, ur: NEGATIVE mg/dL
Specific Gravity, Urine: 1.012 (ref 1.005–1.030)
pH: 6 (ref 5.0–8.0)

## 2020-08-24 LAB — CBC WITH DIFFERENTIAL/PLATELET
Abs Immature Granulocytes: 0.04 10*3/uL (ref 0.00–0.07)
Basophils Absolute: 0 10*3/uL (ref 0.0–0.1)
Basophils Relative: 0 %
Eosinophils Absolute: 0.1 10*3/uL (ref 0.0–0.5)
Eosinophils Relative: 1 %
HCT: 39.1 % (ref 36.0–46.0)
Hemoglobin: 12.3 g/dL (ref 12.0–15.0)
Immature Granulocytes: 0 %
Lymphocytes Relative: 8 %
Lymphs Abs: 0.8 10*3/uL (ref 0.7–4.0)
MCH: 25.8 pg — ABNORMAL LOW (ref 26.0–34.0)
MCHC: 31.5 g/dL (ref 30.0–36.0)
MCV: 82.1 fL (ref 80.0–100.0)
Monocytes Absolute: 0.3 10*3/uL (ref 0.1–1.0)
Monocytes Relative: 3 %
Neutro Abs: 7.9 10*3/uL — ABNORMAL HIGH (ref 1.7–7.7)
Neutrophils Relative %: 88 %
Platelets: 240 10*3/uL (ref 150–400)
RBC: 4.76 MIL/uL (ref 3.87–5.11)
RDW: 13.4 % (ref 11.5–15.5)
WBC: 9.2 10*3/uL (ref 4.0–10.5)
nRBC: 0 % (ref 0.0–0.2)

## 2020-08-24 LAB — COMPREHENSIVE METABOLIC PANEL
ALT: 43 U/L (ref 0–44)
AST: 31 U/L (ref 15–41)
Albumin: 3.5 g/dL (ref 3.5–5.0)
Alkaline Phosphatase: 104 U/L (ref 38–126)
Anion gap: 9 (ref 5–15)
BUN: 10 mg/dL (ref 6–20)
CO2: 26 mmol/L (ref 22–32)
Calcium: 8.4 mg/dL — ABNORMAL LOW (ref 8.9–10.3)
Chloride: 98 mmol/L (ref 98–111)
Creatinine, Ser: 0.57 mg/dL (ref 0.44–1.00)
GFR, Estimated: >60 mL/min (ref >60)
Glucose, Bld: 164 mg/dL — ABNORMAL HIGH (ref 70–99)
Potassium: 3.6 mmol/L (ref 3.5–5.1)
Sodium: 133 mmol/L — ABNORMAL LOW (ref 135–145)
Total Bilirubin: 0.8 mg/dL (ref 0.3–1.2)
Total Protein: 7.1 g/dL (ref 6.5–8.1)

## 2020-08-24 LAB — HCG, QUANTITATIVE, PREGNANCY: hCG, Beta Chain, Quant, S: 4 m[IU]/mL (ref <5)

## 2020-08-24 LAB — LIPASE, BLOOD: Lipase: 31 U/L (ref 11–51)

## 2020-08-24 MED ORDER — ONDANSETRON 4 MG PO TBDP: 4.0000 mg | ORAL_TABLET | Freq: Three times a day (TID) | ORAL | 0 refills | Status: AC | PRN

## 2020-08-24 MED ORDER — ONDANSETRON HCL 4 MG/2ML IJ SOLN
4.0000 mg | Freq: Once | INTRAMUSCULAR | Status: AC
  Administered 2020-08-24: 4 mg via INTRAVENOUS
  Filled 2020-08-24: qty 2

## 2020-08-24 MED ORDER — OXYCODONE HCL 5 MG PO TABS
5.0000 mg | ORAL_TABLET | Freq: Once | ORAL | Status: AC
  Administered 2020-08-24: 5 mg via ORAL
  Filled 2020-08-24: qty 1

## 2020-08-24 MED ORDER — SODIUM CHLORIDE 0.9 % IV BOLUS
1000.0000 mL | Freq: Once | INTRAVENOUS | Status: AC
  Administered 2020-08-24: 1000 mL via INTRAVENOUS

## 2020-08-24 MED ORDER — FAMOTIDINE 20 MG PO TABS: 20.0000 mg | ORAL_TABLET | Freq: Two times a day (BID) | ORAL | 0 refills | Status: AC

## 2020-08-24 NOTE — ED Triage Notes (Signed)
Emergency Medicine Provider Triage Evaluation Note  Kathryn Howard , a 45 y.o. female  was evaluated in triage.  Pt complains of abd pain.  Review of Systems  Positive: LUQ pain, nausea, vomiting, late on menstruation Negative: Fever, cp, sob, dysuria  Physical Exam  There were no vitals taken for this visit. Gen:   Awake, no distress   HEENT:  Atraumatic  Resp:  Normal effort  Cardiac:  Normal rate  Abd:   LUQ tenderness MSK:   Moves extremities without difficulty  Neuro:  Speech clear   Medical Decision Making  Medically screening exam initiated at 5:02 PM.  Appropriate orders placed.  Kathryn Howard was informed that the remainder of the evaluation will be completed by another provider, this initial triage assessment does not replace that evaluation, and the importance of remaining in the ED until their evaluation is complete.  Clinical Impression  Pt on trulicity x 3 months for diabetes, here with LUQ abd pain, n/v.  No hx of alcohol abuse or gallstone.  Possible medication induced pancreatitis.     Fayrene Helper, PA-C 08/24/20 1704

## 2020-08-24 NOTE — ED Provider Notes (Signed)
[facility]   CSN: [id number] Arrival date & time: 08/24/20  1604     History Chief Complaint  Patient presents with  . Emesis    Kathryn Howard is a 45 y.o. female.  Patient presents emergency department today for evaluation of abdominal pain and vomiting.  Patient has type 2 diabetes and is on oral medications as well as insulin.  She states that for the past several days she has had extreme fatigue.  Today she developed abdominal pain that is worse in the left upper quadrant.  She went to her primary care doctor and started having significant episodes of vomiting, nonbloody emesis, and was encouraged to go to the emergency department.  She was started on a new diabetes drugs 3 to 4 months ago and they were concerned that maybe she was developing pancreatitis from this.  Patient has been fasting for Ramadan over the past 25 days and has been adjusting her insulin.  Today her blood sugars have run mostly between 150 and 200.  She denies diarrhea.  No headache or fevers however she does have chills.  No chest pain or shortness of breath.  No urinary symptoms.  No blood in the stool.  Onset of symptoms acute.  Course is constant.  She has had a C-section but no abdominal surgeries otherwise.        Past Medical History:  Diagnosis Date  . Anemia   . Anxiety    no meds  . Asthma    seasonal per patient: hasnt used in months per patient  . Depression    stopped meds 2012  . Diabetes mellitus    type 2  . DUB (dysfunctional uterine bleeding)   . Fatty liver   . Gastroparesis   . GERD (gastroesophageal reflux disease)    with pregnancy  . H pylori ulcer   . H/O hematuria 03/14/11  . H/O rubella   . H/O seasonal allergies   . H/O varicella   . Headache    otc med prn  . History of anxiety   . History of blood transfusion 09/2011   [facility] - 2 units transfused  . History of ovarian cyst   . History of PCOS 03/07/10  .  Hypothyroidism   . Low blood pressure   . Obesity   . Oligomenorrhea 09/12/10  . Pelvic pain 03/14/11   right sided back  . Vitamin D deficiency   . Yeast infection     Patient Active Problem List   Diagnosis Date Noted  . Status post repeat low transverse cesarean section 03/15/2017  . S/P repeat low transverse C-section 06/25/2014  . 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 Separation of cesarean wound with drainage, postpartum 11/06/2011  . Cesarean wound disruption 11/05/2011  . Neonatal death 10/29/2011  . Seroma, postoperative 10/29/2011  . Preterm delivery 10/19/2011  . Anemia 10/19/2011  . History of PCOS 09/14/2011  . 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 Type 2 diabetes mellitus (HCC) 07/10/2011  . Increased BMI 07/10/2011  . Hx of infertility 07/10/2011    Past Surgical History:  Procedure Laterality Date  . CERVICAL CERCLAGE  09/15/2011   Procedure: CERCLAGE CERVICAL;  Surgeon: Kirkland HunArthur Stringer, MD;  Location: [facility];  Service: Gynecology;  Laterality: N/A;  . CERVICAL CERCLAGE N/A 12/31/2013   Procedure: CERCLAGE CERVICAL;  Surgeon: Kirkland HunArthur Stringer, MD;  Location: [facility] ORS;  Service: Gynecology;  Laterality: N/A;  . CERVICAL CERCLAGE N/A 06/25/2014   Procedure: REMOVAL OF  CERCLAGE CERVICAL;  Surgeon: [name], MD;  Location: [facility];  Service: Obstetrics;  Laterality: N/A;  . CERVICAL CERCLAGE N/A 09/27/2016   Procedure: CERCLAGE CERVICAL;  Surgeon: Osborn Coho, MD;  Location: [facility];  Service: Gynecology;  Laterality: N/A;  . CESAREAN SECTION  10/18/2011   Procedure: CESAREAN SECTION;  Surgeon: [name], MD;  Location: [facility];  Service: Gynecology;  Laterality: N/A;  . CESAREAN SECTION N/A 06/25/2014   Procedure: REPEAT CESAREAN SECTION;  Surgeon: [name], MD;  Location: [facility];  Service: Obstetrics;  Laterality: N/A;  . CESAREAN SECTION N/A 03/15/2017   Procedure: CESAREAN SECTION;  Surgeon: Osborn Coho, MD;  Location: [facility];  Service:  Obstetrics;  Laterality: N/A;  Heather K to RNFA  . DILATION AND CURETTAGE OF UTERUS  2008   MAB  . UPPER GI ENDOSCOPY       OB History    Gravida  4   Para  3   Term  2   Preterm  1   AB  1   Living  2     SAB  1   IAB  0   Ectopic  0   Multiple  0   Live Births  3           Family History  Problem Relation Age of Onset  . Diabetes Father   . Hypertension Father   . Diabetes Mother   . Hypertension Mother   . Hyperlipidemia Mother     Social History   Tobacco Use  . Smoking status: Never Smoker  . Smokeless tobacco: Never Used  Substance Use Topics  . Alcohol use: No  . Drug use: No    Home Medications Prior to Admission medications   Medication Sig Start Date End Date Taking? Authorizing Provider  Cholecalciferol 2000 units CAPS Take 2,000 Units daily by mouth.    [provider]  ferrous sulfate 325 (65 FE) MG EC tablet Take 1 tablet (325 mg total) by mouth 2 (two) times daily. 06/28/14 06/28/15  Standard, Venus, CNM  ibuprofen (ADVIL,MOTRIN) 600 MG tablet Take 1 tablet (600 mg total) by mouth every 6 (six) hours as needed for mild pain or cramping. Patient not taking: Reported on 12/07/2016 09/27/16   Osborn Coho, MD  levothyroxine (SYNTHROID, LEVOTHROID) 100 MCG tablet Take 100 mcg by mouth daily before breakfast.    [provider]  mefloquine (LARIAM) 250 MG tablet Take 1 tablet (250 mg total) by mouth every 7 (seven) days. Start 2 wks before leaving, take weekly until 4 wk upon return Patient not taking: Reported on 09/15/2016 03/31/16   Judyann Munson, MD  metFORMIN (FORTAMET) 1000 MG (OSM) 24 hr tablet Take 1 tablet (1,000 mg total) by mouth 2 (two) times daily with a meal. Patient taking differently: Take 1,000 mg by mouth daily before supper.  06/28/14   Standard, Venus, CNM  oxyCODONE (OXY IR/ROXICODONE) 5 MG immediate release tablet Take 1 tablet (5 mg total) every 4 (four) hours as needed by mouth (pain scale 4-7).  03/18/17   Prothero, Henderson Newcomer, CNM  Prenatal Vit-Fe Fumarate-FA (PRENATAL MULTIVITAMIN) TABS Take 1 tablet by mouth daily.     [provider]  PROAIR RESPICLICK 108 (90 Base) MCG/ACT AEPB Inhale 2 puffs into the lungs 4 (four) times daily as needed (shortness of breath).  07/07/16   [provider]  senna-docusate (SENOKOT-S) 8.6-50 MG per tablet Take 2 tablets by mouth daily. Patient not taking:  Reported on 09/15/2016 06/28/14   Standard, Venus, CNM  simethicone (MYLICON) 80 MG chewable tablet Chew 1 tablet (80 mg total) by mouth 3 (three) times daily after meals. Patient not taking: Reported on 09/15/2016 06/28/14   Standard, Venus, CNM    Allergies    Sulfa antibiotics  Review of Systems   Review of Systems  Constitutional: Positive for fatigue. Negative for fever.  HENT: Negative for rhinorrhea and sore throat.   Eyes: Negative for redness.  Respiratory: Negative for cough and shortness of breath.   Cardiovascular: Negative for chest pain.  Gastrointestinal: Positive for abdominal pain, nausea and vomiting. Negative for diarrhea.  Genitourinary: Negative for dysuria, frequency, hematuria and urgency.  Musculoskeletal: Negative for myalgias.  Skin: Negative for rash.  Neurological: Negative for headaches.    Physical Exam Updated Vital Signs BP (!) 129/92   Pulse 94   Temp 99.1 F (37.3 C) (Oral)   Resp 19   SpO2 100%   Physical Exam Vitals and nursing note reviewed.  Constitutional:      General: She is not in acute distress.    Appearance: She is well-developed.  HENT:     Head: Normocephalic and atraumatic.     Right Ear: External ear normal.     Left Ear: External ear normal.     Nose: Nose normal.  Eyes:     Conjunctiva/sclera: Conjunctivae normal.  Cardiovascular:     Rate and Rhythm: Normal rate and regular rhythm.     Heart sounds: No murmur heard.   Pulmonary:     Effort: No respiratory distress.     Breath sounds: No wheezing, rhonchi  or rales.  Abdominal:     Palpations: Abdomen is soft.     Tenderness: There is abdominal tenderness (Mild left upper and epigastric). There is no guarding or rebound.  Musculoskeletal:     Cervical back: Normal range of motion and neck supple.     Right lower leg: No edema.     Left lower leg: No edema.  Skin:    General: Skin is warm and dry.     Findings: No rash.  Neurological:     General: No focal deficit present.     Mental Status: She is alert. Mental status is at baseline.     Motor: No weakness.  Psychiatric:        Mood and Affect: Mood normal.     ED Results / Procedures / Treatments   Labs (all labs ordered are listed, but only abnormal results are displayed) Labs Reviewed  CBC WITH DIFFERENTIAL/PLATELET - Abnormal; Notable for the following components:      Result Value   MCH 25.8 (*)    Neutro Abs 7.9 (*)    All other components within normal limits  COMPREHENSIVE METABOLIC PANEL - Abnormal; Notable for the following components:   Sodium 133 (*)    Glucose, Bld 164 (*)    Calcium 8.4 (*)    All other components within normal limits  LIPASE, BLOOD  URINALYSIS, ROUTINE W REFLEX MICROSCOPIC  HCG, QUANTITATIVE, PREGNANCY    EKG None  Radiology No results found.  Procedures Procedures   Medications Ordered in ED Medications  sodium chloride 0.9 % bolus 1,000 mL (0 mLs Intravenous Stopped 08/24/20 1949)  ondansetron (ZOFRAN) injection 4 mg (4 mg Intravenous Given 08/24/20 1859)    ED Course  I have reviewed the triage vital signs and the nursing notes.  Pertinent labs & imaging results that were available  during my care of the patient were reviewed by me and considered in my medical decision making (see chart for details).  Patient seen and examined. Work-up initiated. Medications ordered.   Vital signs reviewed and are as follows: BP (!) 129/92   Pulse 94   Temp 99.1 F (37.3 C) (Oral)   Resp 19   SpO2 100%   Labs look very good.  On reexam  patient only has minimal tenderness on the abdomen.  Will give fluid challenge and have patient ambulate to the restroom as her pain is worse with sitting up earlier.  Patient seems reassured about her blood test.  Patient has been able to ambulate and is tolerating fluids.  Plan for discharged home with Zofran and Pepcid for symptom control.  Encourage PCP follow-up.  11:19 PM The patient was urged to return to the Emergency Department immediately with worsening of current symptoms, worsening abdominal pain, persistent vomiting, blood noted in stools, fever, or any other concerns. The patient verbalized understanding.       MDM Rules/Calculators/A&P                          Patient with abdominal pain, N/V earlier. Vitals are stable, no fever. Labs reassuring. Imaging not felt indicated given reassuring lab work-up and improvement. No signs of dehydration, patient is tolerating PO's. Lungs are clear and no signs suggestive of PNA. Low concern for appendicitis, cholecystitis, pancreatitis, ruptured viscus, UTI, kidney stone, aortic dissection, aortic aneurysm or other emergent abdominal etiology. Supportive therapy indicated with return if symptoms worsen.   Final Clinical Impression(s) / ED Diagnoses Final diagnoses:  Left upper quadrant abdominal pain  Non-intractable vomiting with nausea, unspecified vomiting type    Rx / DC Orders ED Discharge Orders         Ordered    ondansetron (ZOFRAN ODT) 4 MG disintegrating tablet  Every 8 hours PRN        08/24/20 2254    famotidine (PEPCID) 20 MG tablet  2 times daily        08/24/20 2254           Renne Crigler, PA-C 08/24/20 2320    Terald Sleeper, MD 08/25/20 [phone]

## 2020-08-24 NOTE — ED Triage Notes (Signed)
Pt presents to ED POV. Pt c/o emesis, n/v, and LUQ abd pain. Pt reports that she had been on trulicity for 108m and her PCP told her that medication was messing with her pancrease

## 2020-08-24 NOTE — Discharge Instructions (Signed)
Please read and follow all provided instructions.  Your diagnoses today include:  1. Left upper quadrant abdominal pain   2. Non-intractable vomiting with nausea, unspecified vomiting type     Tests performed today include:  Blood cell counts and platelets  Kidney and liver function tests  Pancreas function test (called lipase) - was normal  Urine test to look for infection  A blood or urine test for pregnancy (women only)  Vital signs. See below for your results today.   Medications prescribed:   Zofran (ondansetron) - for nausea and vomiting   Pepcid (famotidine) - antihistamine  You can find this medication over-the-counter.   DO NOT exceed:   20mg  Pepcid every 12 hours  Take any prescribed medications only as directed.  Home care instructions:   Follow any educational materials contained in this packet.  Follow-up instructions: Please follow-up with your primary care provider in the next 2 days for further evaluation of your symptoms.    Return instructions:  SEEK IMMEDIATE MEDICAL ATTENTION IF:  The pain does not go away or becomes severe   A temperature above 101F develops   Repeated vomiting occurs (multiple episodes)   The pain becomes localized to portions of the abdomen. The right side could possibly be appendicitis. In an adult, the left lower portion of the abdomen could be colitis or diverticulitis.   Blood is being passed in stools or vomit (bright red or black tarry stools)   You develop chest pain, difficulty breathing, dizziness or fainting, or become confused, poorly responsive, or inconsolable (young children)  If you have any other emergent concerns regarding your health  Additional Information: Abdominal (belly) pain can be caused by many things. Your caregiver performed an examination and possibly ordered blood/urine tests and imaging (CT scan, x-rays, ultrasound). Many cases can be observed and treated at home after initial  evaluation in the emergency department. Even though you are being discharged home, abdominal pain can be unpredictable. Therefore, you need a repeated exam if your pain does not resolve, returns, or worsens. Most patients with abdominal pain don't have to be admitted to the hospital or have surgery, but serious problems like appendicitis and gallbladder attacks can start out as nonspecific pain. Many abdominal conditions cannot be diagnosed in one visit, so follow-up evaluations are very important.  Your vital signs today were: BP 113/71   Pulse 95   Temp 99.1 F (37.3 C) (Oral)   Resp (!) 22   SpO2 99%  If your blood pressure (bp) was elevated above 135/85 this visit, please have this repeated by your doctor within one month. --------------

## 2022-01-03 ENCOUNTER — Ambulatory Visit
Admission: RE | Admit: 2022-01-03 | Discharge: 2022-01-03 | Disposition: A | Discharge status: Home / Self Care | Payer: BC Managed Care – PPO | Source: Ambulatory Visit | Attending: Nurse Practitioner | Admitting: Nurse Practitioner

## 2022-01-03 ENCOUNTER — Other Ambulatory Visit: Payer: Self-pay | Admitting: Nurse Practitioner

## 2022-01-03 DIAGNOSIS — M25512 Pain in left shoulder: Secondary | ICD-10-CM

## 2022-01-03 DIAGNOSIS — M5412 Radiculopathy, cervical region: Secondary | ICD-10-CM

## 2023-04-06 ENCOUNTER — Other Ambulatory Visit: Payer: Self-pay

## 2023-04-06 DIAGNOSIS — R7303 Prediabetes: Secondary | ICD-10-CM

## 2023-04-12 ENCOUNTER — Other Ambulatory Visit: Payer: BC Managed Care – PPO

## 2023-04-18 ENCOUNTER — Ambulatory Visit: Payer: BC Managed Care – PPO | Admitting: "Endocrinology

## 2024-01-31 ENCOUNTER — Telehealth: Payer: Self-pay

## 2024-01-31 NOTE — Telephone Encounter (Signed)
 Spoke with patient to schedule appointment with Dr. Prentice Ovens, on 02/12/24 at 4:00pm, as per Mikel Nett

## 2024-02-12 ENCOUNTER — Ambulatory Visit
